# Patient Record
Sex: Female | Born: 1965 | Race: White | Hispanic: No | Marital: Married | Smoking: Former smoker
Health system: Southern US, Community
[De-identification: ages and names within clinical notes are randomized; demographics above are authoritative.]

## PROBLEM LIST (undated history)

## (undated) DIAGNOSIS — I959 Hypotension, unspecified: Secondary | ICD-10-CM

## (undated) DIAGNOSIS — C50511 Malignant neoplasm of lower-outer quadrant of right female breast: Secondary | ICD-10-CM

## (undated) DIAGNOSIS — M533 Sacrococcygeal disorders, not elsewhere classified: Secondary | ICD-10-CM

## (undated) DIAGNOSIS — H6980 Other specified disorders of Eustachian tube, unspecified ear: Secondary | ICD-10-CM

## (undated) DIAGNOSIS — J45909 Unspecified asthma, uncomplicated: Secondary | ICD-10-CM

## (undated) DIAGNOSIS — T7840XA Allergy, unspecified, initial encounter: Secondary | ICD-10-CM

## (undated) DIAGNOSIS — Z923 Personal history of irradiation: Secondary | ICD-10-CM

## (undated) DIAGNOSIS — J189 Pneumonia, unspecified organism: Secondary | ICD-10-CM

## (undated) DIAGNOSIS — R87619 Unspecified abnormal cytological findings in specimens from cervix uteri: Secondary | ICD-10-CM

## (undated) DIAGNOSIS — Z8701 Personal history of pneumonia (recurrent): Secondary | ICD-10-CM

## (undated) DIAGNOSIS — M1990 Unspecified osteoarthritis, unspecified site: Secondary | ICD-10-CM

## (undated) DIAGNOSIS — Z8619 Personal history of other infectious and parasitic diseases: Secondary | ICD-10-CM

## (undated) HISTORY — DX: Allergy, unspecified, initial encounter: T78.40XA

## (undated) HISTORY — PX: TONSILLECTOMY: SUR1361

## (undated) HISTORY — DX: Other disorders of phosphorus metabolism: E83.39

## (undated) HISTORY — DX: Malignant neoplasm of lower-outer quadrant of right female breast: C50.511

## (undated) HISTORY — DX: Sacrococcygeal disorders, not elsewhere classified: M53.3

## (undated) HISTORY — DX: Other specified disorders of Eustachian tube, unspecified ear: H69.80

## (undated) HISTORY — PX: POLYPECTOMY: SHX149

## (undated) HISTORY — DX: Unspecified asthma, uncomplicated: J45.909

## (undated) HISTORY — PX: WISDOM TOOTH EXTRACTION: SHX21

## (undated) HISTORY — DX: Personal history of other infectious and parasitic diseases: Z86.19

## (undated) HISTORY — DX: Personal history of pneumonia (recurrent): Z87.01

## (undated) HISTORY — DX: Unspecified abnormal cytological findings in specimens from cervix uteri: R87.619

---
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
## 1994-12-04 ENCOUNTER — Encounter: Payer: Self-pay | Admitting: Obstetrics & Gynecology

## 1996-04-05 ENCOUNTER — Encounter: Payer: Self-pay | Admitting: Obstetrics & Gynecology

## 1996-09-22 ENCOUNTER — Encounter: Payer: Self-pay | Admitting: Obstetrics & Gynecology

## 1996-11-22 ENCOUNTER — Encounter: Payer: Self-pay | Admitting: Obstetrics & Gynecology

## 1998-03-27 ENCOUNTER — Encounter: Payer: Self-pay | Admitting: Obstetrics & Gynecology

## 2004-04-23 ENCOUNTER — Encounter: Payer: Self-pay | Admitting: Internal Medicine

## 2004-05-09 ENCOUNTER — Encounter: Payer: Self-pay | Admitting: Internal Medicine

## 2004-06-02 ENCOUNTER — Ambulatory Visit: Payer: Self-pay | Admitting: Internal Medicine

## 2004-06-04 ENCOUNTER — Encounter: Payer: Self-pay | Admitting: Internal Medicine

## 2004-06-04 ENCOUNTER — Encounter: Admission: RE | Admit: 2004-06-04 | Discharge: 2004-06-04 | Payer: Self-pay | Admitting: Internal Medicine

## 2005-07-14 ENCOUNTER — Encounter: Payer: Self-pay | Admitting: Obstetrics & Gynecology

## 2005-12-15 ENCOUNTER — Encounter: Payer: Self-pay | Admitting: Obstetrics & Gynecology

## 2005-12-30 ENCOUNTER — Encounter: Payer: Self-pay | Admitting: Obstetrics & Gynecology

## 2006-01-12 ENCOUNTER — Encounter: Payer: Self-pay | Admitting: Obstetrics & Gynecology

## 2006-02-08 ENCOUNTER — Encounter: Payer: Self-pay | Admitting: Obstetrics & Gynecology

## 2006-04-08 ENCOUNTER — Inpatient Hospital Stay (HOSPITAL_COMMUNITY): Admission: AD | Admit: 2006-04-08 | Discharge: 2006-04-10 | Payer: Self-pay | Admitting: Obstetrics and Gynecology

## 2006-04-09 ENCOUNTER — Encounter: Payer: Self-pay | Admitting: Obstetrics & Gynecology

## 2006-04-27 HISTORY — PX: OTHER SURGICAL HISTORY: SHX169

## 2006-06-16 ENCOUNTER — Ambulatory Visit (HOSPITAL_COMMUNITY): Admission: RE | Admit: 2006-06-16 | Discharge: 2006-06-16 | Payer: Self-pay | Admitting: Obstetrics and Gynecology

## 2006-06-16 ENCOUNTER — Encounter (INDEPENDENT_AMBULATORY_CARE_PROVIDER_SITE_OTHER): Payer: Self-pay | Admitting: *Deleted

## 2007-04-23 ENCOUNTER — Ambulatory Visit: Payer: Self-pay | Admitting: Family Medicine

## 2007-10-13 ENCOUNTER — Ambulatory Visit: Payer: Self-pay | Admitting: Internal Medicine

## 2007-10-13 DIAGNOSIS — Z87891 Personal history of nicotine dependence: Secondary | ICD-10-CM | POA: Insufficient documentation

## 2007-10-13 DIAGNOSIS — F17200 Nicotine dependence, unspecified, uncomplicated: Secondary | ICD-10-CM | POA: Insufficient documentation

## 2007-10-13 DIAGNOSIS — E8941 Symptomatic postprocedural ovarian failure: Secondary | ICD-10-CM | POA: Insufficient documentation

## 2007-10-21 ENCOUNTER — Ambulatory Visit: Payer: Self-pay | Admitting: Internal Medicine

## 2007-10-21 ENCOUNTER — Encounter (INDEPENDENT_AMBULATORY_CARE_PROVIDER_SITE_OTHER): Payer: Self-pay | Admitting: *Deleted

## 2007-10-21 LAB — CONVERTED CEMR LAB
OCCULT 1: NEGATIVE
OCCULT 2: NEGATIVE

## 2007-10-25 ENCOUNTER — Encounter (INDEPENDENT_AMBULATORY_CARE_PROVIDER_SITE_OTHER): Payer: Self-pay | Admitting: *Deleted

## 2008-10-15 ENCOUNTER — Ambulatory Visit: Payer: Self-pay | Admitting: Internal Medicine

## 2008-10-15 DIAGNOSIS — F3289 Other specified depressive episodes: Secondary | ICD-10-CM | POA: Insufficient documentation

## 2008-10-15 DIAGNOSIS — K588 Other irritable bowel syndrome: Secondary | ICD-10-CM | POA: Insufficient documentation

## 2008-10-15 DIAGNOSIS — F329 Major depressive disorder, single episode, unspecified: Secondary | ICD-10-CM | POA: Insufficient documentation

## 2008-10-25 ENCOUNTER — Encounter (INDEPENDENT_AMBULATORY_CARE_PROVIDER_SITE_OTHER): Payer: Self-pay | Admitting: *Deleted

## 2009-04-29 ENCOUNTER — Ambulatory Visit: Payer: Self-pay | Admitting: Family Medicine

## 2009-06-21 ENCOUNTER — Telehealth: Payer: Self-pay | Admitting: Family Medicine

## 2010-01-07 ENCOUNTER — Ambulatory Visit: Payer: Self-pay | Admitting: Internal Medicine

## 2010-01-08 ENCOUNTER — Ambulatory Visit: Payer: Self-pay | Admitting: Internal Medicine

## 2010-01-09 LAB — CONVERTED CEMR LAB
Albumin: 4.4 g/dL (ref 3.5–5.2)
Alkaline Phosphatase: 29 units/L — ABNORMAL LOW (ref 39–117)
Basophils Absolute: 0 10*3/uL (ref 0.0–0.1)
Bilirubin, Direct: 0.2 mg/dL (ref 0.0–0.3)
Calcium: 9.4 mg/dL (ref 8.4–10.5)
Eosinophils Absolute: 0.2 10*3/uL (ref 0.0–0.7)
GFR calc non Af Amer: 90.14 mL/min (ref >60)
HCT: 39.9 % (ref 36.0–46.0)
HDL: 63 mg/dL (ref >39.00)
Lymphs Abs: 1.9 10*3/uL (ref 0.7–4.0)
Monocytes Absolute: 0.6 10*3/uL (ref 0.1–1.0)
Monocytes Relative: 7.5 % (ref 3.0–12.0)
Platelets: 319 10*3/uL (ref 150.0–400.0)
RDW: 13.3 % (ref 11.5–14.6)
Sodium: 138 meq/L (ref 135–145)
TSH: 3.09 microintl units/mL (ref 0.35–5.50)
Total CHOL/HDL Ratio: 3
VLDL: 11 mg/dL (ref 0.0–40.0)

## 2010-01-28 ENCOUNTER — Ambulatory Visit: Payer: Self-pay | Admitting: Internal Medicine

## 2010-01-28 LAB — CONVERTED CEMR LAB: OCCULT 1: NEGATIVE

## 2010-01-29 ENCOUNTER — Encounter (INDEPENDENT_AMBULATORY_CARE_PROVIDER_SITE_OTHER): Payer: Self-pay | Admitting: *Deleted

## 2010-02-07 ENCOUNTER — Ambulatory Visit: Payer: Self-pay | Admitting: Internal Medicine

## 2010-02-07 LAB — CONVERTED CEMR LAB: Rapid Strep: NEGATIVE

## 2010-04-08 ENCOUNTER — Telehealth (INDEPENDENT_AMBULATORY_CARE_PROVIDER_SITE_OTHER): Payer: Self-pay | Admitting: *Deleted

## 2010-04-22 ENCOUNTER — Ambulatory Visit: Payer: Self-pay | Admitting: Internal Medicine

## 2010-04-22 DIAGNOSIS — M5412 Radiculopathy, cervical region: Secondary | ICD-10-CM | POA: Insufficient documentation

## 2010-04-25 ENCOUNTER — Ambulatory Visit (HOSPITAL_COMMUNITY)
Admission: RE | Admit: 2010-04-25 | Discharge: 2010-04-25 | Payer: Self-pay | Source: Home / Self Care | Attending: Internal Medicine | Admitting: Internal Medicine

## 2010-05-01 ENCOUNTER — Telehealth: Payer: Self-pay | Admitting: Internal Medicine

## 2010-05-25 LAB — CONVERTED CEMR LAB
ALT: 12 units/L (ref 0–35)
ALT: 17 units/L (ref 0–35)
Albumin: 4.1 g/dL (ref 3.5–5.2)
BUN: 6 mg/dL (ref 6–23)
Basophils Absolute: 0.1 10*3/uL (ref 0.0–0.1)
Basophils Relative: 0.1 % (ref 0.0–3.0)
Basophils Relative: 0.9 % (ref 0.0–1.0)
CO2: 27 meq/L (ref 19–32)
Calcium: 9.2 mg/dL (ref 8.4–10.5)
Chloride: 103 meq/L (ref 96–112)
Cholesterol: 153 mg/dL (ref 0–200)
Cholesterol: 174 mg/dL (ref 0–200)
Creatinine, Ser: 0.7 mg/dL (ref 0.4–1.2)
Eosinophils Absolute: 0.1 10*3/uL (ref 0.0–0.7)
Eosinophils Relative: 1 % (ref 0.0–5.0)
GFR calc Af Amer: 118 mL/min
HCT: 39 % (ref 36.0–46.0)
HCT: 39 % (ref 36.0–46.0)
Hemoglobin: 13.7 g/dL (ref 12.0–15.0)
LDL Cholesterol: 71 mg/dL (ref 0–99)
Lymphs Abs: 0.8 10*3/uL (ref 0.7–4.0)
MCHC: 35 g/dL (ref 30.0–36.0)
MCV: 91 fL (ref 78.0–100.0)
MCV: 91.5 fL (ref 78.0–100.0)
Monocytes Absolute: 0.6 10*3/uL (ref 0.1–1.0)
Monocytes Absolute: 0.8 10*3/uL (ref 0.1–1.0)
Neutro Abs: 5.7 10*3/uL (ref 1.4–7.7)
Potassium: 3.7 meq/L (ref 3.5–5.1)
RBC: 4.26 M/uL (ref 3.87–5.11)
RBC: 4.29 M/uL (ref 3.87–5.11)
RDW: 12 % (ref 11.5–14.6)
TSH: 1.24 microintl units/mL (ref 0.35–5.50)
TSH: 2.16 microintl units/mL (ref 0.35–5.50)
Total Bilirubin: 0.7 mg/dL (ref 0.3–1.2)
Total CHOL/HDL Ratio: 2
Total Protein: 7 g/dL (ref 6.0–8.3)
Triglycerides: 51 mg/dL (ref 0–149)
WBC: 6.2 10*3/uL (ref 4.5–10.5)

## 2010-05-29 ENCOUNTER — Telehealth: Payer: Self-pay | Admitting: Internal Medicine

## 2010-05-29 NOTE — Assessment & Plan Note (Signed)
Summary: followup on shoulder pain/kn   Vital Signs:  Patient profile:   45 year old female Weight:      153.4 pounds BMI:     23.93 Temp:     98.5 degrees F oral Pulse rate:   76 / minute Resp:     15 per minute BP sitting:   116 / 68  (left arm) Cuff size:   regular  Vitals Entered By: Shonna Chock CMA (April 22, 2010 10:37 AM) CC: Ongoing shoulder pain-worse, Shoulder pain   Primary Care Provider:  Alwyn Fuentes  CC:  Ongoing shoulder pain-worse and Shoulder pain.  History of Present Illness:     She was seen for same symptoms by Dr Rennis Chris  @  [facility]  in 2006 -2007 with EMG/NCT and MRI. 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  Appt cancelled with Dr Newell Coral, NS. The patient reports numbness, weakness, tingling in RUE , and chronically  impaired  neck ROM, but denies locking, stiffness, swelling, and redness of the shoulder itself.  The pain is located in the right upper back,  shoulder & RUE.  The pain began without injury; a trigger is  life stresses .  The patient describes the pain as sharp  to  burning.  The pain is worse  through the day. Rx: slight improvement with NSAIDS, Tramadol & muscle relaxants.  Current Medications (verified): 1)  Proair Hfa 108 (90 Base) Mcg/act Aers (Albuterol Sulfate) .... 2 Puffs Qid As Needed 2)  Fluticasone Propionate 50 Mcg/act Susp (Fluticasone Propionate) .Marland Kitchen.. 1 Spray Two Times A Day After Neti Pot 3)  Cyclobenzaprine Hcl 5 Mg Tabs (Cyclobenzaprine Hcl) .Marland Kitchen.. 1 By Mouth Two Times A Day and 1-2 At Bedtime As Needed Only 4)  Tramadol Hcl 50 Mg Tabs (Tramadol Hcl) .... As Needed For Shoulder Pain 5)  Excedrin Extra Strength 250-250-65 Mg Tabs (Aspirin-Acetaminophen-Caffeine) .Marland Kitchen.. 1 By Mouth As Needed For Shoulder Pain 6)  Aleve 220 Mg Tabs (Naproxen Sodium) .... As Needed For Shoulder Pain  Allergies: 1)  ! Codeine 2)  !  Novocain  Physical Exam  General:  in no acute distress; alert,appropriate and cooperative throughout examination Neck:  Decreased ROM especialy to L Extremities:  No clubbing, cyanosis, edema, or deformity noted with normal full range of motion of R shoulder but with some discomfort Neurologic:  alert & oriented X3, strength normal in all extremities, and DTRs symmetrical and normal.   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 2 puffs qid as needed 2)  Fluticasone Propionate 50 Mcg/act Susp (Fluticasone propionate) .Marland Kitchen.. 1 spray two times a day after neti pot 3)  Cyclobenzaprine Hcl 5 Mg Tabs (Cyclobenzaprine hcl) .Marland Kitchen.. 1 by mouth two times a day and 1-2 at bedtime as needed only 4)  Tramadol Hcl 50 Mg Tabs (Tramadol hcl) .... Every 6 hrs as needed for shoulder pain 5)  Excedrin Extra Strength 250-250-65 Mg Tabs (Aspirin-acetaminophen-caffeine) .Marland Kitchen.. 1 by mouth as needed for shoulder pain 6)  Aleve 220 Mg Tabs (Naproxen sodium) .... As needed for shoulder pain  Patient Instructions:  1)  Continue muscle relaxant as needed ; MRI will be scheduled  Prescriptions: TRAMADOL HCL 50 MG TABS (TRAMADOL HCL) every 6 hrs as needed for shoulder pain  #30 x 1   Entered and Authorized by:   Marga Melnick MD   Signed by:   Marga Melnick MD on 04/22/2010   Method used:   Print then Give to Patient   RxID:   [id number]    Orders Added: 1)  Est. Patient Level III [14782] 2)  Radiology Referral [Radiology]

## 2010-05-29 NOTE — Progress Notes (Signed)
Summary: NEEDS CHANTIX PRESCRIPTION  Phone Note Call from Patient Call back at [phone]   Caller: Patient Summary of Call: PATIENT SAYS SHE IS NOW READY TO TRY CHANTIX--PLEASE CALL IT INTO [facility] [address] [facility] [place] Initial call taken by: Jerolyn Shin,  June 21, 2009 12:03 PM  Follow-up for Phone Call        give sample of 2 weeks and rx for con't pack #1  as directed 3 refills Follow-up by: Loreen Freud DO,  June 21, 2009 1:43 PM  Additional Follow-up for Phone Call Additional follow up Details #1::        left message for pt- samples and rx up front for her to pick up. Army Fossa CMA  June 21, 2009 3:00 PM     Additional Follow-up for Phone Call Additional follow up Details #2::    Pts aware. Army Fossa CMA  June 21, 2009 3:43 PM   New/Updated Medications: CHANTIX CONTINUING MONTH PAK 1 MG TABS (VARENICLINE TARTRATE) as directed. CHANTIX STARTING MONTH PAK 0.5 MG X 11 & 1 MG X 42 TABS (VARENICLINE TARTRATE)  Prescriptions: CHANTIX CONTINUING MONTH PAK 1 MG TABS (VARENICLINE TARTRATE) as directed.  #1 x 3   Entered by:   Army Fossa CMA   Authorized by:   Loreen Freud DO   Signed by:   Army Fossa CMA on 06/21/2009   Method used:   Print then Give to Patient   RxID:   [id number]

## 2010-05-29 NOTE — Assessment & Plan Note (Signed)
Summary: CPX///SPH   Vital Signs:  Patient profile:   45 year old female Height:      67.25 inches Weight:      147.4 pounds BMI:     23.00 Temp:     98.4 degrees F oral Pulse rate:   64 / minute Resp:     14 per minute BP sitting:   104 / 62  (left arm) Cuff size:   regular  Vitals Entered By: Shonna Chock CMA (January 07, 2010 1:12 PM)    Primary Care Provider:  Alwyn Ren   History of Present Illness: Barbara Fuentes is here for a physical; she is essentially asymptomatic except for intermittent  neck pain responsive to massage therapy.  Current Medications (verified): 1)  Proair Hfa 108 (90 Base) Mcg/act Aers (Albuterol Sulfate) .... 2 Puffs Qid As Needed  Allergies: 1)  ! Codeine 2)  ! Novocain  Past History:  Past Medical History: Smoker; RAD with RTIs Codeine  intolerance( N&V)  Past Surgical History: PNA as child ; Gastroenteritis  X 4 from age 65 to mid  53s complicated  by dehydration ; G2 P2; [facility] biopsies X2 for abnormal PAP smears (PAP smears WNL since 1995); S/P uterine ablation for dysfunctional menses; Tonsillectomy  Family History: Father:DM  Type 2, alcohol excess , depression Mother: cardiac arrest with novacaine Siblings: bro :AIDS, depression PGF: MI in 44s; PGM :skin cancer ; MGF: colon cancer   Social History: No diet Occupation: Theatre manager Married Current Smoker: 1/2 ppd Alcohol use-yes: socially Regular exercise: walks 3-6 mpd once daily   Review of Systems General:  Denies chills, fever, sleep disorder, sweats, and weight loss. Eyes:  Denies blurring, double vision, and vision loss-both eyes. ENT:  Denies difficulty swallowing and hoarseness. CV:  Denies chest pain or discomfort, leg cramps with exertion, palpitations, shortness of breath with exertion, swelling of feet, and swelling of hands. Resp:  Denies cough, shortness of breath, sputum productive, and wheezing. GI:  Denies abdominal pain, bloody stools, constipation, dark  tarry stools, diarrhea, and indigestion. GU:  Denies decreased libido, discharge, and dysuria. MS:  Denies joint redness, joint swelling, low back pain, mid back pain, and thoracic pain. Derm:  Denies changes in nail beds, dryness, and hair loss. Neuro:  Denies brief paralysis, disturbances in coordination, numbness, poor balance, tingling, and weakness. Psych:  Denies anxiety and depression. Endo:  Denies cold intolerance, excessive hunger, excessive thirst, excessive urination, and heat intolerance. Heme:  Denies abnormal bruising and bleeding. Allergy:  Complains of itching eyes, seasonal allergies, and sneezing; Rx: Claritin or Benadryl as needed .  Physical Exam  General:  Thin,well-nourished; alert,appropriate and cooperative throughout examination Head:  Normocephalic and atraumatic without obvious abnormalities.  Eyes:  No corneal or conjunctival inflammation noted.  Perrla. Funduscopic exam benign, without hemorrhages, exudates or papilledema.  Ears:  External ear exam shows no significant lesions or deformities.  Otoscopic examination reveals clear canals, tympanic membranes are intact bilaterally without bulging, retraction, inflammation or discharge. Hearing is grossly normal bilaterally. Nose:  External nasal examination shows no deformity or inflammation. Nasal mucosa are pink and moist without lesions or exudates. Mouth:  Oral mucosa and oropharynx without lesions or exudates.  Teeth in good repair. Neck:  No deformities, masses, or tenderness noted.Full ROM Lungs:  Normal respiratory effort, chest expands symmetrically. Lungs are clear to auscultation, no crackles or wheezes. Heart:  Normal rate and regular rhythm. S1 and S2 normal without gallop, murmur, click, rub. S4 Abdomen:  Bowel sounds  positive,abdomen soft and non-tender without masses, organomegaly or hernias noted. Aorta palpable w/o AAA Genitalia:  Dr Emelda Brothers, Gyn Msk:  No deformity or scoliosis noted of thoracic or  lumbar spine.   Pulses:  R and L carotid,radial,dorsalis pedis and posterior tibial pulses are full and equal bilaterally Extremities:  No clubbing, cyanosis, edema, or deformity noted with normal full range of motion of all joints.   Neurologic:  alert & oriented X3, strength normal in all extremities, and DTRs symmetrical and normal.   Skin:  Intact without suspicious lesions or rashes Cervical Nodes:  No lymphadenopathy noted Axillary Nodes:  No palpable lymphadenopathy Psych:  memory intact for recent and remote, normally interactive, and good eye contact.     Impression & Recommendations:  Problem # 1:  ROUTINE GENERAL MEDICAL EXAM@HEALTH  CARE FACL (ICD-V70.0)  Orders: EKG w/ Interpretation (93000)  Problem # 2:  NONDEPENDENT TOBACCO USE DISORDER (ICD-305.1) Risk discussed The following medications were removed from the medication list:    Chantix Continuing Month Pak 1 Mg Tabs (Varenicline tartrate) .Marland Kitchen... As directed.    Chantix Starting Month Pak 0.5 Mg X 11 & 1 Mg X 42 Tabs (Varenicline tartrate)  Complete Medication List: 1)  Proair Hfa 108 (90 Base) Mcg/act Aers (Albuterol sulfate) .... 2 puffs qid as needed  Other Orders: Admin 1st Vaccine (04540) Flu Vaccine 53yrs + (98119)  Patient Instructions: 1)  Report persistant neck pain or  pain radiating into upper extremities. Complete stool cards.Call insurance company & see if they will cover a colonoscopy due to your MGF 's history of colon cancer.Consider fasting labs: 2)  BMP ; 3)  Hepatic Panel ; 4)  Lipid Panel ; 5)  TSH; 6)  CBC w/ Diff . 7)  Stop Smoking Tips: Choose a Quit date. Cut down before the Quit date. decide what you will do as a substitute when you feel the urge to smoke(gum,toothpick,exercise). Flu Vaccine Consent Questions     Do you have a history of severe allergic reactions to this vaccine? no    Any prior history of allergic reactions to egg and/or gelatin? no    Do you have a sensitivity to the  preservative Thimersol? no    Do you have a past history of Guillan-Barre Syndrome? no    Do you currently have an acute febrile illness? no    Have you ever had a severe reaction to latex? no    Vaccine information given and explained to patient? yes    Are you currently pregnant? no    Lot Number:[id number]   Exp Date:10/25/2010   Site Given Right Deltoid IMpid Panel ; 5)  TSH; 6)  CBC w/ Diff .   .lbflu

## 2010-05-29 NOTE — Letter (Signed)
Summary: [facility]  [facility]   Imported By: Lanelle Bal 04/29/2010 12:39:40  _____________________________________________________________________  External Attachment:    Type:   Image     Comment:   External Document

## 2010-05-29 NOTE — Assessment & Plan Note (Signed)
Summary: cold/kdc   Vital Signs:  Patient profile:   45 year old female Weight:      149 pounds Temp:     98.6 degrees F oral Pulse rate:   78 / minute Pulse rhythm:   regular BP sitting:   124 / 80  (left arm) Cuff size:   regular  Vitals Entered By: Army Fossa CMA (April 29, 2009 3:42 PM) CC: Pt c/o chest and head/nasal congestion x 8 days. , URI symptoms   History of Present Illness:       This is a 45 year old woman who presents with URI symptoms.  The symptoms began 2 weeks ago.  The patient complains of nasal congestion, purulent nasal discharge, sore throat, and productive cough, but denies clear nasal discharge, dry cough, earache, and sick contacts.  Associated symptoms include low-grade fever (<100.5 degrees).  The patient denies fever, fever of 100.5-103 degrees, fever of 103.1-104 degrees, fever to >104 degrees, stiff neck, dyspnea, wheezing, rash, vomiting, diarrhea, use of an antipyretic, and response to antipyretic.  The patient also reports headache.  The patient denies itchy watery eyes, itchy throat, sneezing, seasonal symptoms, response to antihistamine, muscle aches, and severe fatigue.  Risk factors for Strep sinusitis include tooth pain.  The patient denies the following risk factors for Strep sinusitis: unilateral facial pain, unilateral nasal discharge, poor response to decongestant, double sickening, Strep exposure, tender adenopathy, and absence of cough.    Current Medications (verified): 1)  Augmentin 875-125 Mg Tabs (Amoxicillin-Pot Clavulanate) .Marland Kitchen.. 1 By Mouth Two Times A Day 2)  Proair Hfa 108 (90 Base) Mcg/act Aers (Albuterol Sulfate) .... 2 Puffs Qid As Needed 3)  Symbicort 160-4.5 Mcg/act Aero (Budesonide-Formoterol Fumarate) .... 2 Puffs Two Times A Day  Allergies: 1)  ! Codeine 2)  ! Novocain  Past History:  Past medical, surgical, family and social histories (including risk factors) reviewed for relevance to current acute and chronic  problems.  Past Medical History: Reviewed history from 10/15/2008 and no changes required. Smoker Codeine caused N&V  Past Surgical History: Reviewed history from 10/15/2008 and no changes required. PNA as child ; gastroenteritis age 74 to mid  33s X 4 with dehydration ; G2 P2; [facility] biopsies X2 for abnormal PAP smears(PAP smears WNL > 15 years); S/P uterine ablation for dyafunctional menses; Tonsillectomy  Family History: Reviewed history from 10/15/2008 and no changes required. Father:DM 2, alcohol excess , depression Mother: neg Siblings: bro AIDS, depression PGF MI in 37s; PGM skin CA; MGF colon CA  Social History: Reviewed history from 10/15/2008 and no changes required. No diet Occupation: Theatre manager Married Current Smoker: 1 ppd Alcohol use-yes: socially Regular exercise-no  Review of Systems      See HPI General:  Denies chills, fatigue, fever, loss of appetite, malaise, sleep disorder, sweats, weakness, and weight loss.  Physical Exam  General:  Well-developed,well-nourished,in no acute distress; alert,appropriate and cooperative throughout examination Ears:  External ear exam shows no significant lesions or deformities.  Otoscopic examination reveals clear canals, tympanic membranes are intact bilaterally without bulging, retraction, inflammation or discharge. Hearing is grossly normal bilaterally. Nose:  mucosal erythema, mucosal edema, L frontal sinus tenderness, L maxillary sinus tenderness, R frontal sinus tenderness, and R maxillary sinus tenderness.   Mouth:  pharyngeal erythema.   Neck:  cervical lymphadenopathy.   Lungs:  R wheezes and L wheezes.   Heart:  normal rate and no murmur.   Psych:  Oriented X3 and normally interactive.  Impression & Recommendations:  Problem # 1:  SINUSITIS - ACUTE-NOS (ICD-461.9)  Her updated medication list for this problem includes:    Augmentin 875-125 Mg Tabs (Amoxicillin-pot clavulanate) .Marland Kitchen... 1 by mouth two  times a day  Instructed on treatment. Call if symptoms persist or worsen.   Problem # 2:  BRONCHITIS- ACUTE (ICD-466.0)  Her updated medication list for this problem includes:    Augmentin 875-125 Mg Tabs (Amoxicillin-pot clavulanate) .Marland Kitchen... 1 by mouth two times a day    Proair Hfa 108 (90 Base) Mcg/act Aers (Albuterol sulfate) .Marland Kitchen... 2 puffs qid as needed    Symbicort 160-4.5 Mcg/act Aero (Budesonide-formoterol fumarate) .Marland Kitchen... 2 puffs two times a day    mucinex DM for cough  Complete Medication List: 1)  Augmentin 875-125 Mg Tabs (Amoxicillin-pot clavulanate) .Marland Kitchen.. 1 by mouth two times a day 2)  Proair Hfa 108 (90 Base) Mcg/act Aers (Albuterol sulfate) .... 2 puffs qid as needed 3)  Symbicort 160-4.5 Mcg/act Aero (Budesonide-formoterol fumarate) .... 2 puffs two times a day Prescriptions: SYMBICORT 160-4.5 MCG/ACT AERO (BUDESONIDE-FORMOTEROL FUMARATE) 2 puffs two times a day  #1 x 0   Entered and Authorized by:   Loreen Freud DO   Signed by:   Loreen Freud DO on 04/29/2009   Method used:   Historical   RxID:   [id number] PROAIR HFA 108 (90 BASE) MCG/ACT AERS (ALBUTEROL SULFATE) 2 puffs qid as needed  #1 x 0   Entered and Authorized by:   Loreen Freud DO   Signed by:   Loreen Freud DO on 04/29/2009   Method used:   Electronically to        [facility] [address] (retail)       [address]       [place], [place]  [address]       Ph: [phone]       Fax: [phone]   RxID:   [id number] AUGMENTIN 875-125 MG TABS (AMOXICILLIN-POT CLAVULANATE) 1 by mouth two times a day  #20 x 0   Entered and Authorized by:   Loreen Freud DO   Signed by:   Loreen Freud DO on 04/29/2009   Method used:   Electronically to        [facility] [address] (retail)       [address]       [place], [place]  [address]       Ph: [phone]       Fax: [phone]   RxID:   [id number]

## 2010-05-29 NOTE — Letter (Signed)
Summary: Results Follow up Letter  [facility] [place]  [address] [place], [place] [address]   Phone: [phone]  Fax: [phone]    01/29/2010 MRN: [id number]  [facility] [address] [place], [place]  [address]  Dear Ms. Aragones,  The following are the results of your recent test(s):  Test         Result    Pap Smear:        Normal _____  Not Normal _____ Comments: ______________________________________________________ Cholesterol: LDL(Bad cholesterol):         Your goal is less than:         HDL (Good cholesterol):       Your goal is more than: Comments:  ______________________________________________________ Mammogram:        Normal _____  Not Normal _____ Comments:  ___________________________________________________________________ Hemoccult:        Normal __X___  Not normal _______ Comments:    _____________________________________________________________________ Other Tests:    We routinely do not discuss normal results over the telephone.  If you desire a copy of the results, or you have any questions about this information we can discuss them at your next office visit.   Sincerely,

## 2010-05-29 NOTE — Assessment & Plan Note (Signed)
Summary: FOR STREP THROAT//PH   Vital Signs:  Patient profile:   45 year old female Weight:      150 pounds BMI:     23.40 Temp:     98.4 degrees F oral Pulse rate:   60 / minute Resp:     12 per minute BP sitting:   118 / 78  (left arm) Cuff size:   regular  Vitals Entered By: Shonna Chock CMA (February 07, 2010 9:17 AM) CC: ? Strep, URI symptoms   Primary Care Provider:  Alwyn Ren  CC:  ? Strep and URI symptoms.  History of Present Illness: URI Symptoms      This is a 45 year old woman who presents with URI symptoms X 3 days.  The patient reports nasal congestion, sore throat, and productive cough with yellow sputum, but denies purulent nasal discharge and earache.  Associated symptoms include low-grade fever (<100.5 degrees) and dyspnea when supine .  The patient denies wheezing.  The patient also reports headache.  bilateral facial pain and tender adenopathy.  The patient denies the following risk factors for Strep sinusitis: tooth pain.Rx: OTC meds. Smoking 1/2 ppd.  Allergies: 1)  ! Codeine 2)  ! Novocain  Physical Exam  General:  well-nourished,in no acute distress; alert,appropriate and cooperative throughout examination Eyes:  No corneal or conjunctival inflammation noted.  Nose:  External nasal examination shows no deformity or inflammation. Nasal mucosa are dry without lesions or exudates. R nare closed due to deviation. Hyponasal speech Mouth:  Oral mucosa and oropharynx without lesions or exudates.  Teeth in good repair. Lungs:  Normal respiratory effort, chest expands symmetrically. Lungs : minimal mild rhonchi Cervical Nodes:  No lymphadenopathy noted Axillary Nodes:  No palpable lymphadenopathy   Impression & Recommendations:  Problem # 1:  BRONCHITIS-ACUTE (ICD-466.0)  Her updated medication list for this problem includes:    Proair Hfa 108 (90 Base) Mcg/act Aers (Albuterol sulfate) .Marland Kitchen... 2 puffs qid as needed    Amoxicillin-pot Clavulanate 500-125 Mg Tabs  (Amoxicillin-pot clavulanate) .Marland Kitchen... 1 every 12 hrs with a meal    Benzonatate 200 Mg Caps (Benzonatate) .Marland Kitchen... 1 every 6 hrs as needed cough  Problem # 2:  URI (ICD-465.9)  Her updated medication list for this problem includes:    Benzonatate 200 Mg Caps (Benzonatate) .Marland Kitchen... 1 every 6 hrs as needed cough  Complete Medication List: 1)  Proair Hfa 108 (90 Base) Mcg/act Aers (Albuterol sulfate) .... 2 puffs qid as needed 2)  Fluticasone Propionate 50 Mcg/act Susp (Fluticasone propionate) .Marland Kitchen.. 1 spray two times a day after neti pot 3)  Amoxicillin-pot Clavulanate 500-125 Mg Tabs (Amoxicillin-pot clavulanate) .Marland Kitchen.. 1 every 12 hrs with a meal 4)  Benzonatate 200 Mg Caps (Benzonatate) .Marland Kitchen.. 1 every 6 hrs as needed cough  Other Orders: Rapid Strep (51025)  Patient Instructions: 1)  Drink as much NON dairy  fluid as you can tolerate for the next few days. Neti pot two times a day followed by Nasal spray. 2)  Stop Smoking Tips: Choose a Quit date. Cut down before the Quit date. decide what you will do as a substitute when you feel the urge to smoke(gum,toothpick,exercise). Prescriptions: BENZONATATE 200 MG CAPS (BENZONATATE) 1 every 6 hrs as needed cough  #20 x 0   Entered and Authorized by:   Marga Melnick MD   Signed by:   Marga Melnick MD on 02/07/2010   Method used:   Printed then faxed to .Marland KitchenMarland Kitchen  [facility] (retail)       [address]       [place], [place]  [address]       Ph: [phone]       Fax: [phone]   RxID:   [id number] AMOXICILLIN-POT CLAVULANATE 500-125 MG TABS (AMOXICILLIN-POT CLAVULANATE) 1 every 12 hrs WITH a meal  #20 x 0   Entered and Authorized by:   Marga Melnick MD   Signed by:   Marga Melnick MD on 02/07/2010   Method used:   Printed then faxed to ...       [facility] (retail)       [address]       [place], [place]  [address]       Ph: [phone]       Fax: [phone]   RxID:   [id number] FLUTICASONE  PROPIONATE 50 MCG/ACT SUSP (FLUTICASONE PROPIONATE) 1 spray two times a day after Neti pot  #1 x 5   Entered and Authorized by:   Marga Melnick MD   Signed by:   Marga Melnick MD on 02/07/2010   Method used:   Printed then faxed to ...       [facility] (retail)       [address]       [place], [place]  [address]       Ph: [phone]       Fax: [phone]   RxID:   [id number]   Laboratory Results    Other Tests  Rapid Strep: negative

## 2010-05-29 NOTE — Letter (Signed)
Summary: [facility]  [facility]   Imported By: Lanelle Bal 04/29/2010 12:41:06  _____________________________________________________________________  External Attachment:    Type:   Image     Comment:   External Document

## 2010-05-29 NOTE — Progress Notes (Signed)
Summary: Neurosurgical referral  Phone Note Call from Patient   Summary of Call: Patient called noting that she did receive her results in the mail and would like neurosurgical referral. MD picked by [facility] will be fine.   Please advise. Initial call taken by: Lucious Groves CMA,  May 01, 2010 8:17 AM  Follow-up for Phone Call        Dr Venetia Maxon if on plan Follow-up by: Marga Melnick MD,  May 01, 2010 2:10 PM  Additional Follow-up for Phone Call Additional follow up Details #1::        [facility]ERRED TO [facility], PT PLAN IS NOW BCBS, AWAITING APPT & WILL INFORM PT Magdalen Spatz [facility]  May 08, 2010 10:46 AM

## 2010-05-29 NOTE — Progress Notes (Signed)
Summary: rx request  Phone Note Call from Patient Call back at Home Phone [phone]   Summary of Call: Patient left message on triage that she needs prescription for muscle relaxant that was discussed with MD. She has not needed it until now, but is having severe back and shoulder pain. Please advise. Initial call taken by: Lucious Groves CMA,  April 08, 2010 4:30 PM  Follow-up for Phone Call        Per Dr.Hopper Flexeril 5mg  1 by mouth two times a day #20 Patient aware and ok'd Follow-up by: Shonna Chock CMA,  April 08, 2010 4:52 PM    New/Updated Medications: CYCLOBENZAPRINE HCL 5 MG TABS (CYCLOBENZAPRINE HCL) 1 by mouth two times a day and 1-2 at bedtime as needed ONLY Prescriptions: CYCLOBENZAPRINE HCL 5 MG TABS (CYCLOBENZAPRINE HCL) 1 by mouth two times a day and 1-2 at bedtime as needed ONLY  #20 x 0   Entered by:   Shonna Chock CMA   Authorized by:   Marga Melnick MD   Signed by:   Shonna Chock CMA on 04/08/2010   Method used:   Electronically to        [facility] (retail)       [address]       [place], [place]  [address]       Ph: [phone]       Fax: [phone]   RxID:   [id number]

## 2010-06-12 NOTE — Progress Notes (Signed)
Summary: Referral Sooner  Phone Note Call from Patient Call back at Home Phone [phone]   Caller: Patient Summary of Call: Patient called this morning saying that [facility] has pushed her appt back another 3 weeks. She was told it was due to a surgical scheduling change. Patient is c/o of severe tenderness in the right axilla. Patient says that her right arm feels like she has a blood pressure cuff consistantly on and her right hand is significatly colder then her left hand. She says when she is at work and clicking her mouse she gets a shooting pain up her arm. Patient says the pain is getting worse. She would really like to see another neurosurgeon if possible so she can be seen sooner. Please advise.  Initial call taken by: Harold Barban,  May 29, 2010 8:56 AM  Follow-up for Phone Call        see if insurance covers one of med centers; ask NS group here to put her on cancellation list Follow-up by: Marga Melnick MD,  May 29, 2010 1:52 PM  Additional Follow-up for Phone Call Additional follow up Details #1::        Per [facility], the patient is on their waiting/cancellation list.  Also they are to check with Dr. Fredrich Birks nurse to see about working pt in for a sooner appt.  I explained to patient, she understands. Magdalen Spatz [facility]  June 06, 2010 3:52 PM

## 2010-06-18 ENCOUNTER — Encounter: Payer: Self-pay | Admitting: Internal Medicine

## 2010-07-02 ENCOUNTER — Other Ambulatory Visit (HOSPITAL_COMMUNITY): Payer: BC Managed Care – PPO

## 2010-07-24 NOTE — Letter (Signed)
Summary: [facility]   Imported By: Kassie Mends 07/14/2010 11:02:08  _____________________________________________________________________  External Attachment:    Type:   Image     Comment:   External Document

## 2010-07-28 ENCOUNTER — Telehealth: Payer: Self-pay | Admitting: *Deleted

## 2010-07-28 ENCOUNTER — Encounter (HOSPITAL_COMMUNITY)
Admission: RE | Admit: 2010-07-28 | Discharge: 2010-07-28 | Disposition: A | Discharge status: Home / Self Care | Payer: BC Managed Care – PPO | Source: Ambulatory Visit | Attending: Neurosurgery | Admitting: Neurosurgery

## 2010-07-28 LAB — CBC
HCT: 37.6 % (ref 36.0–46.0)
MCH: 30.2 pg (ref 26.0–34.0)
MCHC: 34 g/dL (ref 30.0–36.0)
MCV: 88.7 fL (ref 78.0–100.0)
RDW: 12.6 % (ref 11.5–15.5)

## 2010-07-28 MED ORDER — MELOXICAM 7.5 MG PO TABS: 7.5000 mg | ORAL_TABLET | Freq: Every day | ORAL | Status: DC

## 2010-07-28 NOTE — Telephone Encounter (Signed)
Chart review this states allergic to codeine. Maalox and 7.5 mg twice a day when necessary dispense 14

## 2010-07-28 NOTE — Telephone Encounter (Signed)
Spoke w/ pt aware rx for meloxicam to be called to pharmacy.

## 2010-07-30 HISTORY — PX: CERVICAL FUSION: SHX112

## 2010-07-31 ENCOUNTER — Observation Stay (HOSPITAL_COMMUNITY)
Admission: RE | Admit: 2010-07-31 | Discharge: 2010-08-01 | Disposition: A | Discharge status: Home / Self Care | Payer: BC Managed Care – PPO | Source: Ambulatory Visit | Attending: Neurosurgery | Admitting: Neurosurgery

## 2010-07-31 ENCOUNTER — Inpatient Hospital Stay (HOSPITAL_COMMUNITY): Payer: BC Managed Care – PPO

## 2010-07-31 DIAGNOSIS — Z01812 Encounter for preprocedural laboratory examination: Secondary | ICD-10-CM | POA: Insufficient documentation

## 2010-07-31 DIAGNOSIS — M5000 Cervical disc disorder with myelopathy, unspecified cervical region: Principal | ICD-10-CM | POA: Insufficient documentation

## 2010-07-31 DIAGNOSIS — Z87891 Personal history of nicotine dependence: Secondary | ICD-10-CM | POA: Insufficient documentation

## 2010-08-13 NOTE — Op Note (Signed)
[name], [name]                ACCOUNT NO.:  [id number]  MEDICAL RECORD NO.:  [id number]           PATIENT TYPE:  O  LOCATION:  3535                         FACILITY:  [facility]  PHYSICIAN:  Danae Orleans. Venetia Maxon, M.D.  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  PROCEDURE:  Anterior cervical decompression and fusion; C4-5, C5-6, C6-7 levels with PEEK interbody cages, morselized bone autograft, allograft with PureGen, and anterior cervical plate.  SURGEON:  Danae Orleans. Venetia Maxon, MD  ASSISTANT:  Georgiann Cocker, RN and Coletta Memos, MD  ANESTHESIA:  General endotracheal anesthesia.  BLOOD LOSS:  50 mL  COMPLICATIONS:  None.  DISPOSITION:  Recovery.  INDICATIONS:  Barbara Fuentes is a 45 year old woman with profound right arm pain and weakness involving her deltoid biceps and triceps.  It was elected to take her to surgery for anterior cervical decompression and fusion at these affected levels.  PROCEDURE:  Barbara Fuentes was brought to the operating room.  Following satisfactory and uncomplicated induction of general endotracheal anesthesia and placement of intravenous lines, the patient was placed in supine position on the operating table.  Her neck was placed in slight extension.  She was placed in 5 pounds Halter traction.  Her anterior neck was then prepped and draped in usual sterile fashion.  Area of planned incision was infiltrated with local lidocaine.  Incision was made in the transverse neck creases from midline to the anterior border of  sternocleidomastoid muscle and carried through platysmal layer. Subplatysmal dissection was performed exposing the anterior border of the sternocleidomastoid muscle.  Using blunt dissection, the carotid sheath was kept lateral and the trachea and esophagus was kept medial exposing the anterior cervical spine.  A bent spinal needle was placed at what was felt to be C5-C6 level and this was confirmed on intraoperative x-ray.  Subsequently, the exposure was carried from C4 to C7 by taking down the longus colli muscles bilaterally with electrocautery and Key elevator.  Self-retaining shadow line retractor was placed to facilitate exposure.  Interspaces were incised and disk material was removed in piecemeal fashion and ventral osteophytes removed with osteophyte tool.  Distraction pins were placed at C4, C5, C6 and C7 levels and at each level, thorough diskectomy was performed under microscope.  The endplates were eburnated and uncinate spurs were drilled down and the spinal cord dura and both C5 nerve roots were widely decompressed.  Subsequently, similar decompression was performed at C5-6 and the right C6 nerve was widely decompressed.  There was some disk material that was herniated directly over the right C6 nerve root. Attention was then turned to the C6-7 level where thorough decompression was also performed and wide decompression of the nerve root was performed.  Hemostasis was assured at each level and after trial sizing,  it was elected to use a 5-mm PEEK interbody cage which was packed with PureGen, soaked on ProFuse block at the C4-5 level.  A similarly sized graft at the C5-6 level and a 6-mm medium PEEK interbody cage was packed in a similar fashion at C6-7 level.  The ventral spine was prepared for plating and after traction weight was discontinued, a 48-mm Trestle anterior cervical plate was affixed to the anterior cervical spine using variable angle 40-mm screws, 2 at C4, 2 at  C5, 2 at C6, and 2 at C7. All screws had excellent purchase and locking mechanisms were engaged. Final x-ray demonstrated well-positioned interbody graft anterior cervical plate.  Soft tissues were inspected and found to be in good repair.  Hemostasis was assured.  Wound was irrigated and the platysma layers were approximated with 3-0 Vicryl sutures.  Skin edges were approximated with 3-0 Vicryl subcuticular stitch.  Wound was dressed with Dermabond.  The patient was extubated in the operating, taken to the recovery in stable and satisfactory condition having tolerated operation well.  Counts were correct at the end of the case.     Danae Orleans. Venetia Maxon, M.D.     JDS/MEDQ  D:  07/31/2010  T:  08/01/2010  Job:  [id number]  Electronically Signed by Maeola Harman M.D. on 08/13/2010 09:54:04 AM

## 2010-08-19 NOTE — Discharge Summary (Signed)
  NAMEVADIS, SLABACH                ACCOUNT NO.:  [id number]  MEDICAL RECORD NO.:  [id number]           PATIENT TYPE:  O  LOCATION:  3535                         FACILITY:  [facility]  PHYSICIAN:  Danae Orleans. Venetia Maxon, M.D.  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  Preoperatively, the patient had profound right arm pain and weakness involving her deltoid, biceps, and triceps and it was elected for her to be admitted for anterior cervical decompression and fusion at those affected levels.  The patient tolerated this procedure well, postoperatively had good relief of her pain, was mobilized and was discharged home with tramadol 50 mg as needed for pain along with multivitamin, calcium, and over-the-counter allergy pills.  She is instructed to wear the cervical collar, do not smoke, and to follow up in my office 3-4 weeks postoperatively with cervical radiographs having tolerated procedure and hospitalization well.     Danae Orleans. Venetia Maxon, M.D.     JDS/MEDQ  D:  08/13/2010  T:  08/14/2010  Job:  [id number]  Electronically Signed by Maeola Harman M.D. on 08/19/2010 07:37:21 AM

## 2010-09-12 NOTE — Op Note (Signed)
Barbara Fuentes, METZINGER                ACCOUNT NO.:  [id number]   MEDICAL RECORD NO.:  [id number]          PATIENT TYPE:  AMB   LOCATION:  SDC                           [facility]ILITY:  [facility]   PHYSICIAN:  Lenoard Aden, M.D.DATE OF BIRTH:  1965/05/22   DATE OF PROCEDURE:  06/16/2006  DATE OF DISCHARGE:                               OPERATIVE REPORT   PREOPERATIVE DIAGNOSIS:  Menorrhagia.   POSTOPERATIVE DIAGNOSIS:  Menorrhagia.   PROCEDURE:  Diagnostic hysteroscopy, D&C, NovaSure endometrial ablation.   SURGEON:  Lenoard Aden, M.D.   ANESTHESIA:  General anesthesia.   ESTIMATED BLOOD LOSS:  Less than 50 mL.   FLUID DEFICIT:  Less than 50 mL.   COMPLICATIONS:  None.   PATHOLOGY:  Endometrial curettings.   Patient to recovery in good condition.   DESCRIPTION OF PROCEDURE:  After being apprised of risks of anesthesia,  infection, bleeding, injury to abdominal organs with need for repair,  delayed versus immediate complications to include bowel and bladder  injury, the patient is brought to the operating room where she is  administered general anesthetic without complications, prepped and  draped in usual fashion, catheterized until the bladder was empty.  On  examination under anesthesia, there was a small anteflexed uterus and no  adnexal masses.  After achieving adequate anesthesia, using the dilute  Nesacaine solution of 20 mL, cervix is easily dilated up to a #21 Pratt  dilator.  Hysteroscope placed.  Cervical length measured at 4 cm,  uterine sounds to 9 cm.  Diagnostic hysteroscopy performed and reveals a  normal endometrial cavity, bilateral normal tubal ostia.  D&C is  performed with sharp curettage in a four quadrant method, endometrial  curettings are collected and sent to pathology.  NovaSure device is  placed.  CO2 test is negative.  Achieving a good seal and seated in the  proper fashion.  At this time, the NovaSure is initiated and the  ablation takes a total  time of 1 minute and 22 seconds.  At this time,  procedure is terminated, device is removed.  Repeat hysteroscopy reveals  an intact intrauterine cavity, no evidence of perforation with good  coverage of the endometrial ablation for the entire  cavity.  Good hemostasis is noted.  There is small bleeding from the  area of paracervical block placement which is sutured using a 2-0  chromic suture with a single mattress type suture done.  All instruments  are removed.  Good hemostasis is noted.  Patient tolerated the procedure  well and is transferred to the recovery room in good condition.      Lenoard Aden, M.D.  Electronically Signed     RJT/MEDQ  D:  06/16/2006  T:  06/16/2006  Job:  [id number]

## 2010-09-12 NOTE — H&P (Signed)
Barbara Fuentes, Barbara Fuentes                ACCOUNT NO.:  [id number]   MEDICAL RECORD NO.:  [id number]          PATIENT TYPE:  INP   LOCATION:  9175                          FACILITY:  [facility]   PHYSICIAN:  Lenoard Aden, M.D.DATE OF BIRTH:  1965-09-16   DATE OF ADMISSION:  04/08/2006  DATE OF DISCHARGE:                              HISTORY & PHYSICAL   CHIEF COMPLAINT:  Labor.   HISTORY OF PRESENT ILLNESS:  She is a 45 year old white female, G 3, P  1, at 76 weeks' gestation with increased frequency of contractions  today.  She has a history of spontaneous vaginal delivery x1.  She is a  nonsmoker, nondrinker.  She denies domestic or physical violence.   MEDICATIONS:  Prenatal vitamins.   She has a history of pre-term contractions.  No evident cervical change.   ALLERGIES:  CODEINE, EPINEPHRINE.   She has a noncontributory social and family history.   PHYSICAL EXAMINATION:  GENERAL:  She is a well-developed, well-nourished  white female in a mild amount of distress.  HEENT:  Normal.  LUNGS:  Clear.  HEART:  Regular rate and rhythm.  ABDOMEN:  Soft, gravid, nontender.  Estimated fetal weight 7 pounds.  PELVIC:  Cervix is 8 cm, 100%, vertex, 0 station.  EXTREMITIES:  No cords.  NEUROLOGIC:  Nonfocal.   IMPRESSION:  1. Thirty-nine week OB.  2. Advanced maternal age.  3. Active labor.   PLAN:  Give epidural as needed.  Anticipate attempts at vaginal  delivery.      Lenoard Aden, M.D.  Electronically Signed     RJT/MEDQ  D:  04/08/2006  T:  04/08/2006  Job:  [id number]

## 2010-10-30 ENCOUNTER — Encounter: Payer: Self-pay | Admitting: Internal Medicine

## 2010-10-31 ENCOUNTER — Ambulatory Visit (INDEPENDENT_AMBULATORY_CARE_PROVIDER_SITE_OTHER): Payer: BC Managed Care – PPO | Admitting: Internal Medicine

## 2010-10-31 ENCOUNTER — Encounter: Payer: Self-pay | Admitting: Internal Medicine

## 2010-10-31 DIAGNOSIS — K589 Irritable bowel syndrome without diarrhea: Secondary | ICD-10-CM

## 2010-10-31 DIAGNOSIS — Z0000 Encounter for general adult medical examination without abnormal findings: Secondary | ICD-10-CM

## 2010-10-31 DIAGNOSIS — R635 Abnormal weight gain: Secondary | ICD-10-CM

## 2010-10-31 DIAGNOSIS — M5412 Radiculopathy, cervical region: Secondary | ICD-10-CM

## 2010-10-31 DIAGNOSIS — F329 Major depressive disorder, single episode, unspecified: Secondary | ICD-10-CM

## 2010-10-31 DIAGNOSIS — F3289 Other specified depressive episodes: Secondary | ICD-10-CM

## 2010-10-31 DIAGNOSIS — E8941 Symptomatic postprocedural ovarian failure: Secondary | ICD-10-CM

## 2010-10-31 NOTE — Patient Instructions (Addendum)
Preventive Health Care: Exercise  30-45  minutes a day, 3-4 days a week. Walking is especially valuable in preventing Osteoporosis. Eat a low-fat diet with lots of fruits and vegetables, up to 7-9 servings per day.Consume less than 30 grams of sugar per day from foods & drinks with High Fructose Corn Syrup as # 1,2,3 or #4 on label.

## 2010-10-31 NOTE — Progress Notes (Signed)
Subjective:    Patient ID: Barbara Fuentes, female    DOB: 1965/08/04, 45 y.o.   MRN: [id number]  HPI  Barbara Fuentes  is here for a Health assessment form completion; she has no acute issues.    Review of Systems Patient reports no vision/ hearing  changes, adenopathy,fever,   persistant / recurrent hoarseness , swallowing issues, chest pain,palpitations,edema,persistant /recurrent cough, hemoptysis, dyspnea( rest/ exertional/paroxysmal nocturnal), gastrointestinal bleeding(melena, rectal bleeding), abdominal pain, significant heartburn,  bowel changes,GU symptoms(dysuria, hematuria,pyuria, incontinence ), Gyn symptoms(abnormal  bleeding , pain),  syncope, focal weakness, numbness & tingling, skin/hair /nail changes,abnormal bruising or bleeding, anxiety,or depression.   She's had approximately 5 pound weight gain despite walking a 5K almost daily; she also engage in spinning classes it is too hot to walk.  She has no other active health issues or symptoms.    Objective:   Physical Exam Gen.: Healthy and well-nourished in appearance. Alert, appropriate and cooperative throughout exam. Head: Normocephalic without obvious abnormalities Eyes: No corneal or conjunctival inflammation noted. Pupils equal round reactive to light and accommodation. Fundal exam is benign without hemorrhages, exudate, papilledema. Extraocular motion intact. Vision grossly normal. Ears: External  ear exam reveals no significant lesions or deformities. Canals clear .TMs normal. Hearing is grossly normal bilaterally. Nose: External nasal exam reveals no deformity or inflammation. Nasal mucosa are pink and moist. No lesions or exudates noted. Mouth: Oral mucosa and oropharynx reveal no lesions or exudates. Teeth in good repair. Neck: No deformities, masses, or tenderness noted. Range of motion slightly decreased (from NS). Thyroid normal. Lungs: Normal respiratory effort; chest expands symmetrically. Lungs are clear to  auscultation without rales, wheezes, or increased work of breathing. Heart: Normal rate and rhythm. Normal S1 and S2. No gallop, click, or rub. No murmur. Abdomen: Bowel sounds normal; abdomen soft and nontender. No masses, organomegaly or hernias noted.                                                                                    Musculoskeletal/extremities: No deformity or scoliosis noted of  the thoracic or lumbar spine. No clubbing, cyanosis, edema, or deformity noted. Range of motion  normal .Tone & strength  normal.Joints normal. Nail health  good. Vascular: Carotid, radial artery, dorsalis pedis and  posterior tibial pulses are full and equal. No bruits present. Neurologic: Alert and oriented x3. Deep tendon reflexes symmetrical and normal.          Skin: Intact without suspicious lesions or rashes. Lymph: No cervical, axillary  lymphadenopathy present. Psych: Mood and affect are normal. Normally interactive                                                                                         Assessment & Plan:  #1 comprehensive physical exam; no acute findings #2 see Problem  List with Assessments & Recommendations Plan: see Orders

## 2010-11-26 ENCOUNTER — Other Ambulatory Visit (HOSPITAL_COMMUNITY): Payer: Self-pay | Admitting: Neurosurgery

## 2010-11-26 DIAGNOSIS — M4712 Other spondylosis with myelopathy, cervical region: Secondary | ICD-10-CM

## 2011-01-27 ENCOUNTER — Ambulatory Visit (HOSPITAL_COMMUNITY)
Admission: RE | Admit: 2011-01-27 | Discharge: 2011-01-27 | Disposition: A | Discharge status: Home / Self Care | Payer: BC Managed Care – PPO | Source: Ambulatory Visit | Attending: Neurosurgery | Admitting: Neurosurgery

## 2011-01-27 ENCOUNTER — Other Ambulatory Visit (HOSPITAL_COMMUNITY): Payer: Self-pay | Admitting: Neurosurgery

## 2011-01-27 ENCOUNTER — Other Ambulatory Visit (HOSPITAL_COMMUNITY): Payer: BC Managed Care – PPO

## 2011-01-27 DIAGNOSIS — M542 Cervicalgia: Secondary | ICD-10-CM | POA: Insufficient documentation

## 2011-01-27 DIAGNOSIS — M4712 Other spondylosis with myelopathy, cervical region: Secondary | ICD-10-CM

## 2011-02-18 ENCOUNTER — Other Ambulatory Visit: Payer: Self-pay | Admitting: Obstetrics and Gynecology

## 2011-02-18 DIAGNOSIS — Z1231 Encounter for screening mammogram for malignant neoplasm of breast: Secondary | ICD-10-CM

## 2011-02-23 ENCOUNTER — Encounter: Payer: Self-pay | Admitting: Internal Medicine

## 2011-02-23 ENCOUNTER — Ambulatory Visit (INDEPENDENT_AMBULATORY_CARE_PROVIDER_SITE_OTHER): Payer: BC Managed Care – PPO | Admitting: Internal Medicine

## 2011-02-23 DIAGNOSIS — R142 Eructation: Secondary | ICD-10-CM

## 2011-02-23 DIAGNOSIS — Z136 Encounter for screening for cardiovascular disorders: Secondary | ICD-10-CM

## 2011-02-23 DIAGNOSIS — R1032 Left lower quadrant pain: Secondary | ICD-10-CM

## 2011-02-23 DIAGNOSIS — R141 Gas pain: Secondary | ICD-10-CM

## 2011-02-23 DIAGNOSIS — Z0000 Encounter for general adult medical examination without abnormal findings: Secondary | ICD-10-CM

## 2011-02-23 DIAGNOSIS — Z23 Encounter for immunization: Secondary | ICD-10-CM

## 2011-02-23 MED ORDER — RANITIDINE HCL 150 MG PO TABS: 150.0000 mg | ORAL_TABLET | Freq: Two times a day (BID) | ORAL | Status: DC

## 2011-02-23 NOTE — Patient Instructions (Signed)
Preventive Health Care: Exercise  30-45  minutes a day, 3-4 days a week. Walking is especially valuable in preventing Osteoporosis. Eat a low-fat diet with lots of fruits and vegetables, up to 7-9 servings per day. Consume less than 30 grams of sugar per day from foods & drinks with High Fructose Corn Syrup as # 1,2,3 or #4 on label. The triggers for burping may   include stress; the "aspirin family" ; alcohol; peppermint; and caffeine (coffee, tea, cola, and chocolate). The aspirin family would include aspirin and the nonsteroidal agents such as ibuprofen &  Naproxen. Tylenol would not cause this. Food & drink should be avoided for @ least 2 hours before going to bed.  Please  schedule fasting Labs : BMET,Lipids, hepatic panel, CBC & dif, TSH(V70.0).  Please bring these instructions to that Lab appt.  Complete stool cards

## 2011-02-23 NOTE — Progress Notes (Signed)
Addended by: Edgardo Roys on: 02/23/2011 02:34 PM   Modules accepted: Orders

## 2011-02-23 NOTE — Progress Notes (Signed)
Subjective:    Patient ID: Barbara Fuentes, female    DOB: Jun 15, 1965, 45 y.o.   MRN: [id number]  HPI  Barbara Fuentes  is here for a physical;acute issues include recurrent burping & intermittent LLQ pain.      Review of Systems ABDOMINAL PAIN: Onset: since pelvic exam early 9/12;no ovarian findings  Radiation: no  Severity: up to 8 Quality: quick & sharp Duration: seconds  Better with: no relievers  Worse with: no triggers except ?waist flexion Symptoms Nausea/Vomiting: no but burping  . No bloating Diarrhea: no  Constipation: no  Melena/BRBPR: no  Anorexia: no  Fever/Chills: no  Dysuria/pyuria/hematuria: no  Rash: no  Wt loss: no  Past Surgeries: no colonoscopy       Objective:   Physical Exam Gen.: Thin but healthy and well-nourished in appearance. Alert, appropriate and cooperative throughout exam. Head: Normocephalic without obvious abnormalities Eyes: No corneal or conjunctival inflammation noted. Pupils equal round reactive to light and accommodation. Fundal exam is benign without hemorrhages, exudate, papilledema. Extraocular motion intact. Vision grossly normal. Ears: External  ear exam reveals no significant lesions or deformities. Canals clear .TMs normal. Hearing is grossly normal bilaterally. Nose: External nasal exam reveals no deformity or inflammation. Nasal mucosa are pink and moist. No lesions or exudates noted. Septum  normal  Mouth: Oral mucosa and oropharynx reveal no lesions or exudates. Teeth in good repair. Neck: No deformities, masses, or tenderness noted. Range of motion markedly reduced. Thyroid normal. Lungs: Normal respiratory effort; chest expands symmetrically. Lungs are clear to auscultation without rales, wheezes, or increased work of breathing. Heart: Normal rate and rhythm. Normal S1 and S2. No gallop, click, or rub. s 4 w/o  murmur. Abdomen: Bowel sounds normal; abdomen soft and nontender. No masses, organomegaly or hernias noted. Aorta  palpable w/o AAA Genitalia: Dr Billy Coast   .                                                                                   Musculoskeletal/extremities: No deformity or scoliosis noted of  the thoracic or lumbar spine. No clubbing, cyanosis, edema, or deformity noted. Range of motion  normal .Tone & strength  normal.Joints normal. Nail health  Good. Minor crepitus L knee Vascular: Carotid, radial artery, dorsalis pedis and  posterior tibial pulses are full and equal. No bruits present. Neurologic: Alert and oriented x3. Deep tendon reflexes symmetrical and normal.          Skin: Intact without suspicious lesions or rashes. Lymph: No cervical, axillary  lymphadenopathy present. Psych: Mood and affect are normal. Normally interactive                                                                                         Assessment & Plan:  #1 comprehensive physical exam; no acute findings #2 see  Problem List with Assessments & Recommendations  #3 intermittent left lower quadrant discomfort, questionably positional. Negative exam. No history of bloating #4 burping Plan: see Orders

## 2011-02-24 ENCOUNTER — Encounter: Payer: Self-pay | Admitting: Gastroenterology

## 2011-03-03 ENCOUNTER — Ambulatory Visit: Payer: BC Managed Care – PPO

## 2011-03-03 ENCOUNTER — Other Ambulatory Visit: Payer: Self-pay | Admitting: Internal Medicine

## 2011-03-03 DIAGNOSIS — Z0000 Encounter for general adult medical examination without abnormal findings: Secondary | ICD-10-CM

## 2011-03-04 ENCOUNTER — Other Ambulatory Visit: Payer: BC Managed Care – PPO

## 2011-03-16 ENCOUNTER — Other Ambulatory Visit: Payer: BC Managed Care – PPO

## 2011-03-17 ENCOUNTER — Ambulatory Visit
Admission: RE | Admit: 2011-03-17 | Discharge: 2011-03-17 | Disposition: A | Discharge status: Home / Self Care | Payer: BC Managed Care – PPO | Source: Ambulatory Visit | Attending: Obstetrics and Gynecology | Admitting: Obstetrics and Gynecology

## 2011-03-17 DIAGNOSIS — Z1231 Encounter for screening mammogram for malignant neoplasm of breast: Secondary | ICD-10-CM

## 2011-03-24 ENCOUNTER — Other Ambulatory Visit: Payer: Self-pay | Admitting: Internal Medicine

## 2011-03-24 ENCOUNTER — Ambulatory Visit: Payer: BC Managed Care – PPO | Admitting: Gastroenterology

## 2011-03-24 DIAGNOSIS — Z0000 Encounter for general adult medical examination without abnormal findings: Secondary | ICD-10-CM

## 2011-03-25 ENCOUNTER — Other Ambulatory Visit (INDEPENDENT_AMBULATORY_CARE_PROVIDER_SITE_OTHER): Payer: BC Managed Care – PPO

## 2011-03-25 DIAGNOSIS — Z0000 Encounter for general adult medical examination without abnormal findings: Secondary | ICD-10-CM

## 2011-03-25 LAB — LIPID PANEL
HDL: 71.3 mg/dL (ref >39.00)
Total CHOL/HDL Ratio: 2
Triglycerides: 39 mg/dL (ref 0.0–149.0)
VLDL: 7.8 mg/dL (ref 0.0–40.0)

## 2011-03-25 LAB — CBC WITH DIFFERENTIAL/PLATELET
Basophils Relative: 1.1 % (ref 0.0–3.0)
Eosinophils Relative: 3.1 % (ref 0.0–5.0)
HCT: 37.8 % (ref 36.0–46.0)
MCV: 92.3 fl (ref 78.0–100.0)
Monocytes Absolute: 0.5 10*3/uL (ref 0.1–1.0)
Monocytes Relative: 6.9 % (ref 3.0–12.0)
Neutrophils Relative %: 60.5 % (ref 43.0–77.0)
Platelets: 314 10*3/uL (ref 150.0–400.0)
RBC: 4.1 Mil/uL (ref 3.87–5.11)
WBC: 7.1 10*3/uL (ref 4.5–10.5)

## 2011-03-25 LAB — BASIC METABOLIC PANEL
CO2: 26 mEq/L (ref 19–32)
Glucose, Bld: 88 mg/dL (ref 70–99)
Potassium: 4.3 mEq/L (ref 3.5–5.1)
Sodium: 139 mEq/L (ref 135–145)

## 2011-03-25 LAB — HEPATIC FUNCTION PANEL
ALT: 13 U/L (ref 0–35)
AST: 16 U/L (ref 0–37)
Albumin: 4 g/dL (ref 3.5–5.2)
Total Bilirubin: 0.5 mg/dL (ref 0.3–1.2)

## 2011-03-25 LAB — TSH: TSH: 3.63 u[IU]/mL (ref 0.35–5.50)

## 2011-03-25 NOTE — Progress Notes (Signed)
12  

## 2012-04-27 DIAGNOSIS — M533 Sacrococcygeal disorders, not elsewhere classified: Secondary | ICD-10-CM

## 2012-04-27 HISTORY — DX: Sacrococcygeal disorders, not elsewhere classified: M53.3

## 2012-05-25 ENCOUNTER — Encounter: Payer: BC Managed Care – PPO | Admitting: Internal Medicine

## 2012-05-25 ENCOUNTER — Ambulatory Visit (INDEPENDENT_AMBULATORY_CARE_PROVIDER_SITE_OTHER): Payer: BC Managed Care – PPO | Admitting: Internal Medicine

## 2012-05-25 ENCOUNTER — Encounter: Payer: Self-pay | Admitting: Internal Medicine

## 2012-05-25 VITALS — BP 116/68 | HR 73 | Temp 98.3°F | Resp 12 | Ht 67.5 in | Wt 151.6 lb

## 2012-05-25 DIAGNOSIS — Z23 Encounter for immunization: Secondary | ICD-10-CM

## 2012-05-25 DIAGNOSIS — Z0000 Encounter for general adult medical examination without abnormal findings: Secondary | ICD-10-CM

## 2012-05-25 DIAGNOSIS — J45909 Unspecified asthma, uncomplicated: Secondary | ICD-10-CM

## 2012-05-25 NOTE — Patient Instructions (Addendum)
Preventive Health Care: Exercise  30-45  minutes a day, 3-4 days a week. Walking is especially valuable in preventing Osteoporosis. Eat a low-fat diet with lots of fruits and vegetables, up to 7-9 servings per day. Consume less than 30 grams of sugar per day from foods & drinks with High Fructose Corn Syrup as #1,2,3 or #4 on label. Please  consider fasting Labs : BMET,Lipids, hepatic panel, CBC & dif, TSH. PLEASE BRING THESE INSTRUCTIONS TO FOLLOW UP  LAB APPOINTMENT.This will guarantee correct labs are drawn, eliminating need for repeat blood sampling ( needle sticks ! ). Diagnoses /Codes: V70.0. As per the Standard of Care , screening Colonoscopy recommended @ 50 & every 5-10 years thereafter . Earlier or more frequent monitor would be dictated by family history (MGF colon cancer @ 91) or findings @ Colonoscopy  To prevent palpitations or premature beats, avoid stimulants such as decongestants, diet pills, nicotine, or caffeine (coffee, tea, cola, or chocolate) to excess.  If you activate My Chart; the results can be released to you as soon as they populate from the lab. If you choose not to use this program; the labs have to be reviewed, copied & mailed   causing a delay in getting the results to you.

## 2012-05-25 NOTE — Progress Notes (Signed)
  Subjective:    Patient ID: Barbara Fuentes, female    DOB: Mar 11, 1966, 47 y.o.   MRN: [id number]  HPI  Barbara Fuentes is here for a physical;acute issues intermittent palpitations @ rest over past year.      Review of Systems She  is on a heart healthy diet;she exercises 20 minutes 5-6 times per week intermittently without symptoms of related chest pain, palpitations, dyspnea, or claudication. Family history is negative for premature coronary disease.   Her only stimulant intake is 2&1/2  pots of coffee a day.    Objective:   Physical Exam Gen.: Thin but healthy and well-nourished in appearance. Alert, appropriate and cooperative throughout exam. Appears younger than stated age  Head: Normocephalic without obvious abnormalities  Eyes: No corneal or conjunctival inflammation noted. Pupils equal round reactive to light and accommodation. Fundal exam is benign without hemorrhages, exudate, papilledema. Extraocular motion intact. Vision grossly normal. Ears: External  ear exam reveals no significant lesions or deformities. Canals clear .TMs normal. Hearing is grossly normal bilaterally. Nose: External nasal exam reveals no deformity or inflammation. Nasal mucosa are pink and moist. No lesions or exudates noted.   Mouth: Oral mucosa and oropharynx reveal no lesions or exudates. Teeth in good repair. Neck: No deformities, masses, or tenderness noted. Range of motion decreased. Thyroid normal. Lungs: Normal respiratory effort; chest expands symmetrically. Lungs are clear to auscultation without rales, wheezes, or increased work of breathing. Heart: Normal rate and rhythm. Normal S1 and S2. No gallop, click, or rub. No murmur. Abdomen: Bowel sounds normal; abdomen soft and nontender. No masses, organomegaly or hernias noted. Genitalia: Dr. Billy Coast, Gyn                                 Musculoskeletal/extremities: No deformity or scoliosis noted of  the thoracic or lumbar spine. No clubbing, cyanosis,  edema, or significant extremity  deformity noted. Range of motion normal except neck .Tone & strength  normal.Joints normal. Nail health good. Able to lie down & sit up w/o help. Negative SLR bilaterally Vascular: Carotid, radial artery, dorsalis pedis and  posterior tibial pulses are full and equal. No bruits present. Neurologic: Alert and oriented x3. Deep tendon reflexes symmetrical and normal.  Skin: Intact without suspicious lesions or rashes. Lymph: No cervical, axillary lymphadenopathy present. Psych: Mood and affect are normal. Normally interactive                                                                                         Assessment & Plan:  #1 comprehensive physical exam; no acute findings #2 palpitations most likely due to caffeine excess  Plan: see Orders  & Recommendations

## 2012-07-25 ENCOUNTER — Other Ambulatory Visit: Payer: Self-pay | Admitting: Internal Medicine

## 2012-07-26 NOTE — Telephone Encounter (Signed)
Spoke with patient, patient with current problem in lower back (disk problem) , patient took these medications in the past and would like to restart. Patient aware Barbara Fuentes is out of office and refill to be addressed 07/27/12- patient ok with that   Hopp please advise

## 2012-07-27 NOTE — Telephone Encounter (Signed)
OK X1 but OVINB

## 2012-11-10 ENCOUNTER — Ambulatory Visit (INDEPENDENT_AMBULATORY_CARE_PROVIDER_SITE_OTHER): Payer: BC Managed Care – PPO | Admitting: Internal Medicine

## 2012-11-10 ENCOUNTER — Encounter: Payer: Self-pay | Admitting: Internal Medicine

## 2012-11-10 VITALS — BP 118/72 | HR 59 | Ht 66.08 in | Wt 146.0 lb

## 2012-11-10 DIAGNOSIS — Z0000 Encounter for general adult medical examination without abnormal findings: Secondary | ICD-10-CM

## 2012-11-10 LAB — ALKALINE PHOSPHATASE: Alkaline Phosphatase: 27 U/L — ABNORMAL LOW (ref 39–117)

## 2012-11-10 LAB — LIPID PANEL
Cholesterol: 169 mg/dL (ref 0–200)
Triglycerides: 53 mg/dL (ref 0.0–149.0)
VLDL: 10.6 mg/dL (ref 0.0–40.0)

## 2012-11-10 NOTE — Patient Instructions (Addendum)
Please consult your insurance company about the coverage for screening  Colonoscopy because of family history.  If you activate the  My Chart system; lab & Xray results will be released directly  to you as soon as I review & address these through the computer. If you choose not to sign up for My Chart within 36 hours of labs being drawn; results will be reviewed & interpretation added before being copied & mailed, causing a delay in getting the results to you.If you do not receive that report within 7-10 days ,please call. Additionally you can use this system to gain direct  access to your records  if  out of town or @ an office of a  physician who is not in  the My Chart network.  This improves continuity of care & places you in control of your medical record.

## 2012-11-10 NOTE — Progress Notes (Signed)
  Subjective:    Patient ID: Barbara Fuentes, female    DOB: 23-Sep-1965, 47 y.o.   MRN: [id number]  HPI  She is here for biometric screening as part of her employment benefit. This does require lipids and fasting blood sugar.  In reviewing her chart she has had decreased alkaline phosphatase in the past. Her TSH was also tenting upward.  There is a history of colon cancer in a gram father in his 28s. She has not had a colonoscopy to date    Review of Systems She is on a heart healthy diet; she  Has no regular exercise program. She denies chest pain, palpitations, dyspnea, or claudication.   She has no unexplained weight loss, abdominal pain, melena, or rectal bleeding. 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     Objective:   Physical Exam Appears healthy and well-nourished & in no acute distress  No carotid bruits are present.No neck pain distention present at 10 - 15 degrees. Thyroid normal to palpation  Heart rhythm and rate are normal with no significant murmurs or gallops. S4 with slurring  Chest is clear with no increased work of breathing  Aorta palpable but there is no evidence of aortic aneurysm or renal artery bruits  Abdomen soft with no organomegaly or masses. No HJR  No clubbing, cyanosis or edema present.  Pedal pulses are intact   No ischemic skin changes are present . Nails healthy    Alert and oriented. Strength, tone, DTRs reflexes normal          Assessment & Plan:  See Current Assessment & Plan in Problem List under specific Diagnosis

## 2012-11-10 NOTE — Assessment & Plan Note (Signed)
Check level 

## 2012-11-11 ENCOUNTER — Encounter: Payer: Self-pay | Admitting: *Deleted

## 2012-11-21 ENCOUNTER — Telehealth: Payer: Self-pay | Admitting: Internal Medicine

## 2012-11-21 NOTE — Telephone Encounter (Signed)
Form faxed to [phone] and to patient at [phone]

## 2012-11-21 NOTE — Telephone Encounter (Signed)
Patient is calling in regards to her Biometrics screening form for her health insurance. She came in on 11/10/12 and states that she left it for it to be signed and sent back to her. She is calling on the status of this request. Fax#: [phone]

## 2012-11-29 ENCOUNTER — Telehealth: Payer: Self-pay | Admitting: Internal Medicine

## 2012-11-29 NOTE — Telephone Encounter (Signed)
Patient left vm on triage line stating we filled out a form for her insurance, but it is not signed/dated by the doctor. Do you still have a copy of this?

## 2012-11-29 NOTE — Telephone Encounter (Signed)
Form was sent for scanning, I will look to see if formed scanned into system later this week

## 2012-12-02 NOTE — Telephone Encounter (Signed)
Left message on VM for patient informing her form signed and re-submitted

## 2013-01-05 ENCOUNTER — Encounter: Payer: Self-pay | Admitting: Family Medicine

## 2013-01-05 ENCOUNTER — Ambulatory Visit (INDEPENDENT_AMBULATORY_CARE_PROVIDER_SITE_OTHER): Payer: BC Managed Care – PPO | Admitting: Family Medicine

## 2013-01-05 VITALS — BP 118/78 | HR 62 | Temp 98.2°F | Wt 146.0 lb

## 2013-01-05 DIAGNOSIS — M533 Sacrococcygeal disorders, not elsewhere classified: Secondary | ICD-10-CM | POA: Insufficient documentation

## 2013-01-05 MED ORDER — NAPROXEN 500 MG PO TABS: 500.0000 mg | ORAL_TABLET | Freq: Two times a day (BID) | ORAL | Status: DC

## 2013-01-05 MED ORDER — CYCLOBENZAPRINE HCL 5 MG PO TABS: ORAL_TABLET | ORAL | Status: DC

## 2013-01-05 NOTE — Assessment & Plan Note (Signed)
New.  Pt injured 3-4 weeks ago while tubing.  No radicular pain.  Suspect bruised or cracked sacrum.  Start scheduled NSAIDs, flexeril for paraspinal spasm.  Reviewed supportive care and red flags that should prompt return.  Pt expressed understanding and is in agreement w/ plan.

## 2013-01-05 NOTE — Patient Instructions (Addendum)
This is a tailbone injury Start the Naproxen twice daily- w/ food- for 5-7 days and then as needed Use the flexeril for muscle spasm Heat or ice Call with any questions or concerns Hang in there!

## 2013-01-05 NOTE — Progress Notes (Signed)
  Subjective:    Patient ID: Barbara Fuentes, female    DOB: 1966-01-30, 46 y.o.   MRN: [id number]  HPI Back pain- hx of DDD of cervical spine s/p surgery.  ~1 month ago was tubing and hit tailbone on rock.  Now having LBP.  'debilitating as of last weekend'- was unable to get out of bed.  Lying flat is most painful.  No radiation of pain.  No weakness or numbness of legs.  No bowel or bladder incontinence.  Using flexeril nightly w/ some relief.  'dramatically improved 2 days ago'.   Review of Systems For ROS see HPI     Objective:   Physical Exam  Vitals reviewed. Constitutional: She is oriented to person, place, and time. She appears well-developed and well-nourished. No distress.  Cardiovascular: Intact distal pulses.   Musculoskeletal:  + TTP over sacrum w/ paraspinal spasm No pain w/ flexion/extension (-) SLR bilaterally  Neurological: She is alert and oriented to person, place, and time. She has normal reflexes. No cranial nerve deficit. Coordination normal.  Skin: Skin is warm and dry.  Psychiatric: She has a normal mood and affect. Her behavior is normal.          Assessment & Plan:

## 2013-01-10 ENCOUNTER — Ambulatory Visit: Payer: BC Managed Care – PPO | Admitting: Internal Medicine

## 2013-03-02 ENCOUNTER — Other Ambulatory Visit: Payer: Self-pay

## 2013-05-29 ENCOUNTER — Ambulatory Visit: Payer: BC Managed Care – PPO | Admitting: Internal Medicine

## 2013-12-05 ENCOUNTER — Other Ambulatory Visit (INDEPENDENT_AMBULATORY_CARE_PROVIDER_SITE_OTHER): Payer: BC Managed Care – PPO

## 2013-12-05 ENCOUNTER — Ambulatory Visit (INDEPENDENT_AMBULATORY_CARE_PROVIDER_SITE_OTHER): Payer: BC Managed Care – PPO | Admitting: Internal Medicine

## 2013-12-05 ENCOUNTER — Encounter: Payer: Self-pay | Admitting: Internal Medicine

## 2013-12-05 VITALS — BP 93/67 | HR 66 | Temp 97.9°F | Wt 153.4 lb

## 2013-12-05 DIAGNOSIS — Z9189 Other specified personal risk factors, not elsewhere classified: Secondary | ICD-10-CM

## 2013-12-05 DIAGNOSIS — Z87898 Personal history of other specified conditions: Secondary | ICD-10-CM

## 2013-12-05 DIAGNOSIS — G4730 Sleep apnea, unspecified: Secondary | ICD-10-CM

## 2013-12-05 DIAGNOSIS — J309 Allergic rhinitis, unspecified: Secondary | ICD-10-CM

## 2013-12-05 LAB — TSH: TSH: 3.23 u[IU]/mL (ref 0.35–4.50)

## 2013-12-05 NOTE — Patient Instructions (Signed)

## 2013-12-05 NOTE — Progress Notes (Signed)
   Subjective:    Patient ID: Barbara Fuentes, female    DOB: 1965/06/23, 48 y.o.   MRN: [id number]  HPI  For years she's had some snoring and possible intermittent minor apneic spells but this has gotten much worse recently. This is associated with daytime fatigue. She denies any motor vehicle accidents or neuromuscular impairment.  She describes herself as waking up gasping.  She has significant extrinsic symptoms only when she is around cats but otherwise has chronic nasal congestion. She's been taking Zyrtec with only partial relief  She concerned because her weight does not drop despite walking working out 7 hours a week.    Review of Systems Pertinent negative or absent signs and symptoms are as follows: Constitutional: No change in appetite. Eye: no blurred, double ,loss of vision. She does need reading glasses. Cardiovascular: no palpitations; racing; irregularity ENT/GI: no constipation; diarrhea;hoarseness;dysphagia Derm: no change in nails,hair,skin Neuro: no numbness or tingling; tremor Psych:no anxiety; depression; panic attacks Endo: no temperature intolerance to heat ,cold      Objective:   Physical Exam Pertinent positive findings include: Marked congestion of the nares with bilateral obstruction to airflow.   Slightly crowded oro pharynx is present. There is significant decrease range of motion of the cervical spine.  General appearance:good health ;well nourished; no acute distress or increased work of breathing is present.  No  lymphadenopathy about the head, neck, or axilla noted.   Eyes: No conjunctival inflammation or lid edema is present. There is no scleral icterus.  Ears:  External ear exam shows no significant lesions or deformities.  Otoscopic examination reveals clear canals, tympanic membranes are intact bilaterally without bulging, retraction, inflammation or discharge.  Nose:  External nasal examination shows no deformity or inflammation.  No  septal dislocation or deviation.  Oral exam: Dental hygiene is good; lips and gums are healthy appearing.There is no oropharyngeal erythema or exudate noted.   Neck:  No deformities, thyromegaly, masses, or tenderness noted.      Heart:  Normal rate and regular rhythm. S1 and S2 normal without gallop, murmur, click, rub or other extra sounds.   Lungs:Chest clear to auscultation; no wheezes, rhonchi,rales ,or rubs present.No increased work of breathing.    Extremities:  No cyanosis, edema, or clubbing  noted    Skin: Warm & dry         Assessment & Plan:  #1 history of snoring and apnea  #2 chronic nasal congestion with airflow obstruction  #3 inability to lose weight  Plan: See orders and recommendations

## 2013-12-05 NOTE — Progress Notes (Signed)
Pre visit review using our clinic review tool, if applicable. No additional management support is needed unless otherwise documented below in the visit note. 

## 2014-02-25 DIAGNOSIS — H6990 Unspecified Eustachian tube disorder, unspecified ear: Secondary | ICD-10-CM

## 2014-02-25 DIAGNOSIS — H6980 Other specified disorders of Eustachian tube, unspecified ear: Secondary | ICD-10-CM

## 2014-02-25 HISTORY — DX: Unspecified eustachian tube disorder, unspecified ear: H69.90

## 2014-02-25 HISTORY — DX: Other specified disorders of Eustachian tube, unspecified ear: H69.80

## 2014-02-27 ENCOUNTER — Ambulatory Visit (INDEPENDENT_AMBULATORY_CARE_PROVIDER_SITE_OTHER): Payer: BC Managed Care – PPO | Admitting: Physician Assistant

## 2014-02-27 ENCOUNTER — Encounter: Payer: Self-pay | Admitting: Physician Assistant

## 2014-02-27 VITALS — BP 109/63 | HR 55 | Temp 98.3°F | Resp 16 | Ht 67.0 in | Wt 153.5 lb

## 2014-02-27 DIAGNOSIS — H6502 Acute serous otitis media, left ear: Secondary | ICD-10-CM

## 2014-02-27 DIAGNOSIS — H6500 Acute serous otitis media, unspecified ear: Secondary | ICD-10-CM | POA: Insufficient documentation

## 2014-02-27 DIAGNOSIS — H6992 Unspecified Eustachian tube disorder, left ear: Secondary | ICD-10-CM

## 2014-02-27 DIAGNOSIS — H6990 Unspecified Eustachian tube disorder, unspecified ear: Secondary | ICD-10-CM | POA: Insufficient documentation

## 2014-02-27 MED ORDER — PREDNISONE 20 MG PO TABS: 40.0000 mg | ORAL_TABLET | Freq: Every day | ORAL | Status: DC

## 2014-02-27 NOTE — Assessment & Plan Note (Signed)
Unresponsive to conservative measures.  Continue Flonase and Zyrtec.  Rx Prednisone 3 day burst to help alleviate fluid and pressure.  If persisting, will need evaluation by ENT.

## 2014-02-27 NOTE — Progress Notes (Signed)
Pre visit review using our clinic review tool, if applicable. No additional management support is needed unless otherwise documented below in the visit note/SLS  

## 2014-02-27 NOTE — Progress Notes (Signed)
Patient presents to clinic today c/o pressure and fullness behind left ear over the past 2 weeks.  Denies vertigo or tinnitus.  Has history of seasonal allergies for which she take a daily Zyrtec and Flonase.  Past Medical History  Diagnosis Date  . RAD (reactive airway disease)      FORMER smoker , RAD with RTI's, cat or allergen exposure  . Alkaline phosphatase deficiency     28 in 9/11    Current Outpatient Prescriptions on File Prior to Visit  Medication Sig Dispense Refill  . cyclobenzaprine (FLEXERIL) 5 MG tablet TAKE 1 TABLET BY MOUTH TWICE DAILY AND 1 TO 2 TABS AT BEDTIME ASNEEDED ONLY. 30 tablet 0   No current facility-administered medications on file prior to visit.    Allergies  Allergen Reactions  . Codeine     REACTION: nausea & vomiting  . Procaine Hcl     REACTION: tachycardia    Family History  Problem Relation Age of Onset  . Diabetes Father     Type II, AO  . Alcohol abuse Father   . Depression Father   . Depression Brother   . Heart attack Paternal Grandfather     died in late 1s  . Colon cancer Maternal Grandfather 34  . HIV Brother     AIDS  . COPD Paternal Grandmother   . Stroke Neg Hx   . Prostate cancer Father     History   Social History  . Marital Status: Married    Spouse Name: N/A    Number of Children: N/A  . Years of Education: N/A   Occupational History  . Sales Associate     Social History Main Topics  . Smoking status: Former Smoker    Quit date: 06/18/2010  . Smokeless tobacco: None     Comment: smoked age [age] up to 1 ppd  . Alcohol Use: 1.8 oz/week    3 Glasses of wine per week     Comment: socially  . Drug Use: No  . Sexual Activity: None   Other Topics Concern  . None   Social History Narrative   Review of Systems - See HPI.  All other ROS are negative.  BP 109/63 mmHg  Pulse 55  Temp(Src) 98.3 F (36.8 C) (Oral)  Resp 16  Ht 5\' 7"  (1.702 m)  Wt 153 lb 8 oz (69.627 kg)  BMI 24.04 kg/m2  SpO2  100%  LMP 02/13/2014  Physical Exam  Constitutional: She is well-developed, well-nourished, and in no distress.  HENT:  Head: Normocephalic and atraumatic.  Right Ear: Tympanic membrane, external ear and ear canal normal.  Left Ear: External ear and ear canal normal. Tympanic membrane is retracted. A middle ear effusion is present.  Nose: Nose normal. Right sinus exhibits no maxillary sinus tenderness and no frontal sinus tenderness. Left sinus exhibits no maxillary sinus tenderness and no frontal sinus tenderness.  Mouth/Throat: Uvula is midline, oropharynx is clear and moist and mucous membranes are normal. No oropharyngeal exudate.  Eyes: Conjunctivae are normal. Pupils are equal, round, and reactive to light.  Cardiovascular: Normal rate, regular rhythm, normal heart sounds and intact distal pulses.   Pulmonary/Chest: Effort normal and breath sounds normal.  Skin: Skin is warm and dry. No rash noted.  Vitals reviewed.   Recent Results (from the past 2160 hour(s))  TSH     Status: None   Collection Time: 12/05/13  3:49 PM  Result Value Ref Range   TSH 3.23  0.35 - 4.50 uIU/mL    Assessment/Plan: Acute serous otitis media Unresponsive to conservative measures.  Continue Flonase and Zyrtec.  Rx Prednisone 3 day burst to help alleviate fluid and pressure.  If persisting, will need evaluation by ENT.

## 2014-02-27 NOTE — Patient Instructions (Signed)
Please take steroid as directed for 3 days.  If symptoms are not completely resolved, you may finish course of steroid.  Continue zyrtec and Nasocort daily.  Limit salt intake.  If symptoms persist, may need to set you up with a ENT physician for further management.

## 2014-07-27 LAB — HM PAP SMEAR

## 2014-07-27 LAB — HM MAMMOGRAPHY: HM MAMMO: NORMAL (ref 0–4)

## 2014-08-07 ENCOUNTER — Encounter: Payer: Self-pay | Admitting: Internal Medicine

## 2014-08-07 ENCOUNTER — Other Ambulatory Visit (INDEPENDENT_AMBULATORY_CARE_PROVIDER_SITE_OTHER): Payer: BLUE CROSS/BLUE SHIELD

## 2014-08-07 ENCOUNTER — Ambulatory Visit (INDEPENDENT_AMBULATORY_CARE_PROVIDER_SITE_OTHER): Payer: BLUE CROSS/BLUE SHIELD | Admitting: Internal Medicine

## 2014-08-07 VITALS — BP 110/68 | HR 83 | Temp 98.4°F | Resp 14 | Ht 67.0 in | Wt 160.8 lb

## 2014-08-07 DIAGNOSIS — Z0000 Encounter for general adult medical examination without abnormal findings: Secondary | ICD-10-CM

## 2014-08-07 DIAGNOSIS — Z0189 Encounter for other specified special examinations: Secondary | ICD-10-CM

## 2014-08-07 LAB — HEPATIC FUNCTION PANEL
ALK PHOS: 29 U/L — AB (ref 39–117)
ALT: 14 U/L (ref 0–35)
AST: 16 U/L (ref 0–37)
Albumin: 4.2 g/dL (ref 3.5–5.2)
BILIRUBIN DIRECT: 0.1 mg/dL (ref 0.0–0.3)
BILIRUBIN TOTAL: 0.4 mg/dL (ref 0.2–1.2)
Total Protein: 7.1 g/dL (ref 6.0–8.3)

## 2014-08-07 LAB — BASIC METABOLIC PANEL
BUN: 8 mg/dL (ref 6–23)
CALCIUM: 9.4 mg/dL (ref 8.4–10.5)
CO2: 29 mEq/L (ref 19–32)
Chloride: 104 mEq/L (ref 96–112)
Creatinine, Ser: 0.69 mg/dL (ref 0.40–1.20)
GFR: 95.82 mL/min (ref >60.00)
GLUCOSE: 104 mg/dL — AB (ref 70–99)
Potassium: 4.1 mEq/L (ref 3.5–5.1)
Sodium: 138 mEq/L (ref 135–145)

## 2014-08-07 LAB — TSH: TSH: 2.32 u[IU]/mL (ref 0.35–4.50)

## 2014-08-07 LAB — T4, FREE: Free T4: 1 ng/dL (ref 0.60–1.60)

## 2014-08-07 NOTE — Patient Instructions (Signed)
  Your next office appointment will be determined based upon review of your pending labs .Those instructions will be transmitted to you by My Chart   Critical results will be called.

## 2014-08-07 NOTE — Progress Notes (Signed)
Pre visit review using our clinic review tool, if applicable. No additional management support is needed unless otherwise documented below in the visit note. 

## 2014-08-07 NOTE — Progress Notes (Signed)
Subjective:    Patient ID: Barbara Fuentes, female    DOB: 1965-09-06, 49 y.o.   MRN: [id number]  HPI  She  is here for a physical;acute issues include concerns about weight gain despite therapeutic lifestyle interventions.  She is on a heart healthy diet; she exercises for 60 minutes at least 6 days a week without cardiopulmonary symptoms.  Family history is negative for premature heart attack or stroke. There is colon cancer in one grandparent in the 14s. She has no active GI symptoms .  She quit smoking 2012. She has labs monitored at work. These include lipids and glucose. Her serial lipids have been excellent indicating no long-term risk. The only abnormality has been reduced alkaline phosphatase.  She previously had some possible reactive airways and intermittent eustachian tube dysfunction and possible serous otitis media. These have resolved with nasal hygiene.   Review of Systems   Chest pain, palpitations, tachycardia, exertional dyspnea, paroxysmal nocturnal dyspnea, claudication or edema are absent.  Unexplained weight loss, abdominal pain, significant dyspepsia, dysphagia, melena, rectal bleeding, or persistently small caliber stools are denied.  Dysuria, pyuria, hematuria, frequency, or polyuria are denied.Nocturia X 1.    Objective:   Physical Exam  Gen.: Adequately nourished in appearance. Alert, appropriate and cooperative throughout exam. BMI: 25.17 Appears younger than stated age  Head: Normocephalic without obvious abnormalities  Eyes: No corneal or conjunctival inflammation noted. Pupils equal round reactive to light and accommodation. Extraocular motion intact.  Ears: External  ear exam reveals no significant lesions or deformities. Canals clear .TMs normal. Hearing is grossly normal bilaterally. Nose: External nasal exam reveals no deformity or inflammation. Nasal mucosa are pink and moist. No lesions or exudates noted.   Mouth: Oral mucosa and oropharynx  reveal no lesions or exudates. Teeth in good repair. Neck: No deformities, masses, or tenderness noted. Range of motion decreased. Thyroid normal.. Lungs: Normal respiratory effort; chest expands symmetrically. Lungs are clear to auscultation without rales, wheezes, or increased work of breathing. Heart: Normal rate and rhythm. Normal S1 and S2. No gallop, click, or rub. No murmur. Abdomen: Bowel sounds normal; abdomen soft and nontender. No masses, organomegaly or hernias noted.Aorta palpable ; no AAA Genitalia: as per Gyn                                  Musculoskeletal/extremities: No deformity or scoliosis noted of  the thoracic or lumbar spine.  No clubbing, cyanosis, edema, or significant extremity  deformity noted.  Range of motion normal . Tone & strength normal. Hand joints normal.  Fingernail  health good. Minor crepitus of knees  Able to lie down & sit up w/o help.  Negative SLR bilaterally Vascular: Carotid, radial artery, dorsalis pedis and  posterior tibial pulses are full and equal. No bruits present. Neurologic: Alert and oriented x3. Deep tendon reflexes symmetrical and normal.  Gait normal       Skin: Intact without suspicious lesions or rashes. Lymph: No cervical, axillary  lymphadenopathy present. Psych: Mood and affect are normal. Normally interactive  Assessment & Plan:  #1 comprehensive physical exam; no acute findings  Plan: see Orders  & Recommendations

## 2014-08-07 NOTE — Progress Notes (Signed)
   Subjective:    Patient ID: Barbara Fuentes, female    DOB: [birth year], 49 y.o.   MRN: [id number]  HPI  Fasting labs done at work included total cholesterol 176; HDL 69; and glucose 84. All within normal limits  Review of Systems     Objective:   Physical Exam        Assessment & Plan:

## 2014-08-08 ENCOUNTER — Telehealth: Payer: Self-pay

## 2014-08-08 DIAGNOSIS — R7309 Other abnormal glucose: Secondary | ICD-10-CM

## 2014-08-08 NOTE — Telephone Encounter (Signed)
-----   Message from Hendricks Limes, MD sent at 08/07/2014  5:53 PM EDT ----- Please add A1c (R73.9)

## 2014-08-08 NOTE — Telephone Encounter (Signed)
Phone call to patient. She needs to come back to lab to have a1c drawn since that tube was not drawn at the initial time she had blood drawn. Order has been placed.

## 2014-08-10 ENCOUNTER — Other Ambulatory Visit (INDEPENDENT_AMBULATORY_CARE_PROVIDER_SITE_OTHER): Payer: BLUE CROSS/BLUE SHIELD

## 2014-08-10 DIAGNOSIS — R7309 Other abnormal glucose: Secondary | ICD-10-CM | POA: Diagnosis not present

## 2014-08-10 LAB — HEMOGLOBIN A1C: Hgb A1c MFr Bld: 5.4 % (ref 4.6–6.5)

## 2015-04-28 DIAGNOSIS — Z923 Personal history of irradiation: Secondary | ICD-10-CM

## 2015-04-28 HISTORY — DX: Personal history of irradiation: Z92.3

## 2015-08-22 ENCOUNTER — Telehealth: Payer: Self-pay | Admitting: Behavioral Health

## 2015-08-22 ENCOUNTER — Encounter: Payer: Self-pay | Admitting: Behavioral Health

## 2015-08-22 NOTE — Telephone Encounter (Signed)
Pre-Visit Call completed with patient and chart updated.   Pre-Visit Info documented in Specialty Comments under [employer].    

## 2015-08-23 ENCOUNTER — Encounter: Payer: Self-pay | Admitting: Family Medicine

## 2015-08-23 ENCOUNTER — Ambulatory Visit (INDEPENDENT_AMBULATORY_CARE_PROVIDER_SITE_OTHER): Payer: BLUE CROSS/BLUE SHIELD | Admitting: Family Medicine

## 2015-08-23 VITALS — BP 104/72 | HR 80 | Temp 98.8°F | Ht 67.0 in | Wt 144.4 lb

## 2015-08-23 DIAGNOSIS — T7840XD Allergy, unspecified, subsequent encounter: Secondary | ICD-10-CM

## 2015-08-23 DIAGNOSIS — Z8619 Personal history of other infectious and parasitic diseases: Secondary | ICD-10-CM

## 2015-08-23 DIAGNOSIS — Z8701 Personal history of pneumonia (recurrent): Secondary | ICD-10-CM

## 2015-08-23 DIAGNOSIS — Z1211 Encounter for screening for malignant neoplasm of colon: Secondary | ICD-10-CM

## 2015-08-23 DIAGNOSIS — Z0000 Encounter for general adult medical examination without abnormal findings: Secondary | ICD-10-CM | POA: Diagnosis not present

## 2015-08-23 DIAGNOSIS — N951 Menopausal and female climacteric states: Secondary | ICD-10-CM | POA: Insufficient documentation

## 2015-08-23 DIAGNOSIS — T7840XA Allergy, unspecified, initial encounter: Secondary | ICD-10-CM

## 2015-08-23 HISTORY — DX: Personal history of other infectious and parasitic diseases: Z86.19

## 2015-08-23 HISTORY — DX: Personal history of pneumonia (recurrent): Z87.01

## 2015-08-23 HISTORY — DX: Allergy, unspecified, initial encounter: T78.40XA

## 2015-08-23 NOTE — Progress Notes (Signed)
Pre visit review using our clinic review tool, if applicable. No additional management support is needed unless otherwise documented below in the visit note. 

## 2015-08-23 NOTE — Patient Instructions (Addendum)
Check with insurance to see if insurance will cover the shingles injection. Consider Singulair Medication.  Allergies An allergy is an abnormal reaction to a substance by the body's defense system (immune system). Allergies can develop at any age. WHAT CAUSES ALLERGIES? An allergic reaction happens when the immune system mistakenly reacts to a normally harmless substance, called an allergen, as if it were harmful. The immune system releases antibodies to fight the substance. Antibodies eventually release a chemical called histamine into the bloodstream. The release of histamine is meant to protect the body from infection, but it also causes discomfort. An allergic reaction can be triggered by:  Eating an allergen.  Inhaling an allergen.  Touching an allergen. WHAT TYPES OF ALLERGIES ARE THERE? There are many types of allergies. Common types include:  Seasonal allergies. People with this type of allergy are usually allergic to substances that are only present during certain seasons, such as molds and pollens.  Food allergies.  Drug allergies.  Insect allergies.  Animal dander allergies. WHAT ARE SYMPTOMS OF ALLERGIES? Possible allergy symptoms include:  Swelling of the lips, face, tongue, mouth, or throat.  Sneezing, coughing, or wheezing.  Nasal congestion.  Tingling in the mouth.  Rash.  Itching.  Itchy, red, swollen areas of skin (hives).  Watery eyes.  Vomiting.  Diarrhea.  Dizziness.  Lightheadedness.  Fainting.  Trouble breathing or swallowing.  Chest tightness.  Rapid heartbeat. HOW ARE ALLERGIES DIAGNOSED? Allergies are diagnosed with a medical and family history and one or more of the following:  Skin tests.  Blood tests.  A food diary. A food diary is a record of all the foods and drinks you have in a day and of all the symptoms you experience.  The results of an elimination diet. An elimination diet involves eliminating foods from your diet  and then adding them back in one by one to find out if a certain food causes an allergic reaction. HOW ARE ALLERGIES TREATED? There is no cure for allergies, but allergic reactions can be treated with medicine. Severe reactions usually need to be treated at a hospital. HOW CAN REACTIONS BE PREVENTED? The best way to prevent an allergic reaction is by avoiding the substance you are allergic to. Allergy shots and medicines can also help prevent reactions in some cases. People with severe allergic reactions may be able to prevent a life-threatening reaction called anaphylaxis with a medicine given right after exposure to the allergen.   This information is not intended to replace advice given to you by your health care provider. Make sure you discuss any questions you have with your health care provider.   Document Released: 07/07/2002 Document Revised: 05/04/2014 Document Reviewed: 01/23/2014 Elsevier Interactive Patient Education [employer].

## 2015-08-23 NOTE — Progress Notes (Signed)
Subjective:    Patient ID: Barbara Fuentes, female    DOB: 01-17-1966, 50 y.o.   MRN: [id number]  Chief Complaint  Patient presents with  . Establish Care    HPI Patient is in today to establish care as a new patient.  Patient does not have a any concerns.  Patient reports she has been having some hot flashes and believes she may be going through menopause but just stop missing period. Patient does report some seasonal allergies that cause some real bad brachial episodes takes otc zyrtec and nasal spray.  Denies CP/palp/SOB/HA/congestion/fevers/GI or GU c/o. Taking meds as prescribed. PMH includes allergies, perimenopause, asthma and IBS  Past Medical History  Diagnosis Date  . RAD (reactive airway disease)      FORMER smoker , RAD with RTI's, cat or allergen exposure  . Alkaline phosphatase deficiency     29 in 9/11  . Eustachian tube dysfunction 02/2014  . Serous otitis media 02/2014  . Sacral pain 2014    trauma ( fall)  . Preventative health care 08/23/2015  . Perimenopause 08/23/2015  . History of chicken pox 08/23/2015  . H/O recurrent pneumonia 08/23/2015  . Allergic state 08/23/2015    Past Surgical History  Procedure Laterality Date  . Tonsillectomy    . Cervical fusion  07/30/2010    & Discectomy, Dr Vertell Limber ( @ 3 levels)  . Septoplasty  1983    for septal deviation  . Uterine ablation  2008    Dr Ronita Hipps  . Wisdom tooth extraction      Family History  Problem Relation Age of Onset  . Diabetes Father     Type II, AO  . Alcohol abuse Father   . Depression Father   . Prostate cancer Father   . Depression Brother   . Heart attack Paternal Grandfather     died in late 48s  . Heart disease Paternal Grandfather   . Alcohol abuse Paternal Grandfather     smoker  . Colon cancer Maternal Grandfather 62  . Cancer Maternal Grandfather     colon, colostomy  . HIV Brother     AIDS  . COPD Paternal Grandmother   . Alcohol abuse Paternal Grandmother     smoker  .  Stroke Neg Hx   . Heart disease Mother     cardiac rest, epinephrine with Novocaine  . Arthritis Mother     Social History   Social History  . Marital Status: Married    Spouse Name: N/A  . Number of Children: N/A  . Years of Education: N/A   Occupational History  . Sales Associate     Social History Main Topics  . Smoking status: Former Smoker    Quit date: 06/18/2010  . Smokeless tobacco: Not on file     Comment: smoked age [age] up to 1 ppd  . Alcohol Use: 1.8 oz/week    3 Glasses of wine per week     Comment: socially  . Drug Use: No  . Sexual Activity: Not on file   Other Topics Concern  . Not on file   Social History Narrative    Outpatient Prescriptions Prior to Visit  Medication Sig Dispense Refill  . cetirizine (ZYRTEC) 10 MG tablet Take 10 mg by mouth daily.    . cyclobenzaprine (FLEXERIL) 5 MG tablet TAKE 1 TABLET BY MOUTH TWICE DAILY AND 1 TO 2 TABS AT BEDTIME ASNEEDED ONLY. 30 tablet 0  . fluticasone (FLONASE) 50 MCG/ACT  nasal spray Place 2 sprays into both nostrils daily.     No facility-administered medications prior to visit.    Allergies  Allergen Reactions  . Codeine     REACTION: nausea & vomiting  . Procaine Hcl     REACTION: tachycardia    Review of Systems  Constitutional: Negative for fever and malaise/fatigue.  HENT: Negative for congestion.   Eyes: Negative for blurred vision.  Respiratory: Negative for shortness of breath.   Cardiovascular: Negative for chest pain, palpitations and leg swelling.  Gastrointestinal: Negative for nausea, abdominal pain and blood in stool.  Genitourinary: Negative for dysuria and frequency.  Musculoskeletal: Negative for falls.  Skin: Negative for rash.  Neurological: Negative for dizziness, loss of consciousness and headaches.  Endo/Heme/Allergies: Positive for environmental allergies.  Psychiatric/Behavioral: Negative for depression. The patient is not nervous/anxious.        Objective:      Physical Exam  Constitutional: She is oriented to person, place, and time. She appears well-developed and well-nourished. No distress.  HENT:  Head: Normocephalic and atraumatic.  Eyes: Conjunctivae are normal.  Neck: Neck supple. No thyromegaly present.  Cardiovascular: Normal rate, regular rhythm and normal heart sounds.   No murmur heard. Pulmonary/Chest: Effort normal and breath sounds normal. No respiratory distress.  Abdominal: Soft. Bowel sounds are normal. She exhibits no distension and no mass. There is no tenderness.  Musculoskeletal: She exhibits no edema.  Lymphadenopathy:    She has no cervical adenopathy.  Neurological: She is alert and oriented to person, place, and time.  Skin: Skin is warm and dry.  Psychiatric: She has a normal mood and affect. Her behavior is normal.    BP 104/72 mmHg  Pulse 80  Temp(Src) 98.8 F (37.1 C) (Oral)  Ht 5\' 7"  (1.702 m)  Wt 144 lb 6 oz (65.488 kg)  BMI 22.61 kg/m2  SpO2 96% Wt Readings from Last 3 Encounters:  08/23/15 144 lb 6 oz (65.488 kg)  08/07/14 160 lb 12 oz (72.916 kg)  02/27/14 153 lb 8 oz (69.627 kg)     Lab Results  Component Value Date   WBC 7.1 03/25/2011   HGB 12.7 03/25/2011   HCT 37.8 03/25/2011   PLT 314.0 03/25/2011   GLUCOSE 104* 08/07/2014   CHOL 169 11/10/2012   TRIG 53.0 11/10/2012   HDL 75.70 11/10/2012   LDLCALC 83 11/10/2012   ALT 14 08/07/2014   AST 16 08/07/2014   NA 138 08/07/2014   K 4.1 08/07/2014   CL 104 08/07/2014   CREATININE 0.69 08/07/2014   BUN 8 08/07/2014   CO2 29 08/07/2014   TSH 2.32 08/07/2014   HGBA1C 5.4 08/10/2014    Lab Results  Component Value Date   TSH 2.32 08/07/2014   Lab Results  Component Value Date   WBC 7.1 03/25/2011   HGB 12.7 03/25/2011   HCT 37.8 03/25/2011   MCV 92.3 03/25/2011   PLT 314.0 03/25/2011   Lab Results  Component Value Date   NA 138 08/07/2014   K 4.1 08/07/2014   CO2 29 08/07/2014   GLUCOSE 104* 08/07/2014   BUN 8  08/07/2014   CREATININE 0.69 08/07/2014   BILITOT 0.4 08/07/2014   ALKPHOS 29* 08/07/2014   AST 16 08/07/2014   ALT 14 08/07/2014   PROT 7.1 08/07/2014   ALBUMIN 4.2 08/07/2014   CALCIUM 9.4 08/07/2014   GFR 95.82 08/07/2014   Lab Results  Component Value Date   CHOL 169 11/10/2012   Lab  Results  Component Value Date   HDL 75.70 11/10/2012   Lab Results  Component Value Date   LDLCALC 83 11/10/2012   Lab Results  Component Value Date   TRIG 53.0 11/10/2012   Lab Results  Component Value Date   CHOLHDL 2 11/10/2012   Lab Results  Component Value Date   HGBA1C 5.4 08/10/2014       Assessment & Plan:   Problem List Items Addressed This Visit    Preventative health care - Primary    Patient encouraged to maintain heart healthy diet, regular exercise, adequate sleep. Consider daily probiotics. Take medications as prescribed. Given and reviewed copy of ACP documents from [employer] and encouraged to complete and return      Perimenopause    Encouraged 8 hours of sleep, regular exercise, hi protein diet, low carb, small, frequent meals and probiotics      History of chicken pox   H/O recurrent pneumonia   Allergic state    Encouraged daily antihistamine, nasal steroid and probiotics.        Other Visit Diagnoses    Screening for colon cancer        Relevant Orders    Ambulatory referral to Gastroenterology       I am having Ms. Luevanos maintain her cyclobenzaprine, fluticasone, and cetirizine.  No orders of the defined types were placed in this encounter.     [name], MD

## 2015-08-27 ENCOUNTER — Other Ambulatory Visit: Payer: BLUE CROSS/BLUE SHIELD

## 2015-09-08 NOTE — Assessment & Plan Note (Signed)
Encouraged daily antihistamine, nasal steroid and probiotics.

## 2015-09-08 NOTE — Assessment & Plan Note (Signed)
Patient encouraged to maintain heart healthy diet, regular exercise, adequate sleep. Consider daily probiotics. Take medications as prescribed. Given and reviewed copy of ACP documents from [place] Secretary of State and encouraged to complete and return 

## 2015-09-08 NOTE — Assessment & Plan Note (Signed)
Encouraged 8 hours of sleep, regular exercise, hi protein diet, low carb, small, frequent meals and probiotics

## 2015-10-31 ENCOUNTER — Telehealth: Payer: Self-pay | Admitting: Family Medicine

## 2015-10-31 ENCOUNTER — Other Ambulatory Visit (INDEPENDENT_AMBULATORY_CARE_PROVIDER_SITE_OTHER): Payer: BLUE CROSS/BLUE SHIELD

## 2015-10-31 DIAGNOSIS — T7840XD Allergy, unspecified, subsequent encounter: Secondary | ICD-10-CM

## 2015-10-31 DIAGNOSIS — N951 Menopausal and female climacteric states: Secondary | ICD-10-CM | POA: Diagnosis not present

## 2015-10-31 DIAGNOSIS — Z0000 Encounter for general adult medical examination without abnormal findings: Secondary | ICD-10-CM

## 2015-10-31 LAB — CBC
HEMATOCRIT: 39 % (ref 36.0–46.0)
Hemoglobin: 13 g/dL (ref 12.0–15.0)
MCHC: 33.3 g/dL (ref 30.0–36.0)
MCV: 91.2 fl (ref 78.0–100.0)
Platelets: 348 10*3/uL (ref 150.0–400.0)
RBC: 4.28 Mil/uL (ref 3.87–5.11)
RDW: 13.3 % (ref 11.5–15.5)
WBC: 9.1 10*3/uL (ref 4.0–10.5)

## 2015-10-31 LAB — LIPID PANEL
CHOL/HDL RATIO: 3
Cholesterol: 179 mg/dL (ref 0–200)
HDL: 70.1 mg/dL (ref >39.00)
LDL Cholesterol: 96 mg/dL (ref 0–99)
NONHDL: 108.45
Triglycerides: 64 mg/dL (ref 0.0–149.0)
VLDL: 12.8 mg/dL (ref 0.0–40.0)

## 2015-10-31 LAB — COMPREHENSIVE METABOLIC PANEL
ALK PHOS: 32 U/L — AB (ref 39–117)
ALT: 15 U/L (ref 0–35)
AST: 16 U/L (ref 0–37)
Albumin: 4.2 g/dL (ref 3.5–5.2)
BILIRUBIN TOTAL: 0.4 mg/dL (ref 0.2–1.2)
BUN: 11 mg/dL (ref 6–23)
CO2: 26 meq/L (ref 19–32)
Calcium: 9.2 mg/dL (ref 8.4–10.5)
Chloride: 102 mEq/L (ref 96–112)
Creatinine, Ser: 0.66 mg/dL (ref 0.40–1.20)
GFR: 100.36 mL/min (ref >60.00)
GLUCOSE: 100 mg/dL — AB (ref 70–99)
Potassium: 4.3 mEq/L (ref 3.5–5.1)
SODIUM: 135 meq/L (ref 135–145)
TOTAL PROTEIN: 6.8 g/dL (ref 6.0–8.3)

## 2015-10-31 LAB — TSH: TSH: 3.09 u[IU]/mL (ref 0.35–4.50)

## 2015-10-31 NOTE — Telephone Encounter (Signed)
Form received.  Completed as much as possible and forwarded to Dr. Charlett Blake for review and signature.

## 2015-10-31 NOTE — Telephone Encounter (Signed)
Relation to [id number] Call back [place]:  Reason for call:  Patient dropped of [employer] form placed in [place]. Mailbox.

## 2015-12-16 ENCOUNTER — Other Ambulatory Visit: Payer: Self-pay | Admitting: Obstetrics and Gynecology

## 2015-12-16 DIAGNOSIS — R921 Mammographic calcification found on diagnostic imaging of breast: Secondary | ICD-10-CM

## 2015-12-18 ENCOUNTER — Other Ambulatory Visit: Payer: Self-pay | Admitting: Obstetrics and Gynecology

## 2015-12-18 ENCOUNTER — Ambulatory Visit
Admission: RE | Admit: 2015-12-18 | Discharge: 2015-12-18 | Disposition: A | Discharge status: Home / Self Care | Payer: BLUE CROSS/BLUE SHIELD | Source: Ambulatory Visit | Attending: Obstetrics and Gynecology | Admitting: Obstetrics and Gynecology

## 2015-12-18 DIAGNOSIS — R921 Mammographic calcification found on diagnostic imaging of breast: Secondary | ICD-10-CM

## 2015-12-19 ENCOUNTER — Ambulatory Visit
Admission: RE | Admit: 2015-12-19 | Discharge: 2015-12-19 | Disposition: A | Discharge status: Home / Self Care | Payer: BLUE CROSS/BLUE SHIELD | Source: Ambulatory Visit | Attending: Obstetrics and Gynecology | Admitting: Obstetrics and Gynecology

## 2015-12-19 DIAGNOSIS — R921 Mammographic calcification found on diagnostic imaging of breast: Secondary | ICD-10-CM

## 2015-12-19 HISTORY — PX: BREAST BIOPSY: SHX20

## 2015-12-20 ENCOUNTER — Telehealth: Payer: Self-pay | Admitting: *Deleted

## 2015-12-20 ENCOUNTER — Encounter: Payer: Self-pay | Admitting: *Deleted

## 2015-12-20 DIAGNOSIS — Z171 Estrogen receptor negative status [ER-]: Secondary | ICD-10-CM

## 2015-12-20 DIAGNOSIS — C50511 Malignant neoplasm of lower-outer quadrant of right female breast: Secondary | ICD-10-CM

## 2015-12-20 HISTORY — DX: Malignant neoplasm of lower-outer quadrant of right female breast: C50.511

## 2015-12-20 NOTE — Telephone Encounter (Signed)
Confirmed [facility] for 12/25/15 at 0815.  Instructions and contact information given.

## 2015-12-25 ENCOUNTER — Ambulatory Visit: Payer: BLUE CROSS/BLUE SHIELD | Admitting: Physical Therapy

## 2015-12-25 ENCOUNTER — Encounter: Payer: Self-pay | Admitting: *Deleted

## 2015-12-25 ENCOUNTER — Ambulatory Visit (HOSPITAL_BASED_OUTPATIENT_CLINIC_OR_DEPARTMENT_OTHER): Payer: BLUE CROSS/BLUE SHIELD | Admitting: Hematology and Oncology

## 2015-12-25 ENCOUNTER — Ambulatory Visit
Admission: RE | Admit: 2015-12-25 | Discharge: 2015-12-25 | Disposition: A | Discharge status: Home / Self Care | Payer: BLUE CROSS/BLUE SHIELD | Source: Ambulatory Visit | Attending: Radiation Oncology | Admitting: Radiation Oncology

## 2015-12-25 ENCOUNTER — Other Ambulatory Visit (HOSPITAL_BASED_OUTPATIENT_CLINIC_OR_DEPARTMENT_OTHER): Payer: BLUE CROSS/BLUE SHIELD

## 2015-12-25 ENCOUNTER — Encounter: Payer: Self-pay | Admitting: Genetic Counselor

## 2015-12-25 DIAGNOSIS — D0511 Intraductal carcinoma in situ of right breast: Secondary | ICD-10-CM

## 2015-12-25 DIAGNOSIS — C50511 Malignant neoplasm of lower-outer quadrant of right female breast: Secondary | ICD-10-CM

## 2015-12-25 DIAGNOSIS — Z171 Estrogen receptor negative status [ER-]: Secondary | ICD-10-CM

## 2015-12-25 LAB — COMPREHENSIVE METABOLIC PANEL
ALT: 20 U/L (ref 0–55)
AST: 19 U/L (ref 5–34)
Albumin: 3.8 g/dL (ref 3.5–5.0)
Alkaline Phosphatase: 32 U/L — ABNORMAL LOW (ref 40–150)
Anion Gap: 10 mEq/L (ref 3–11)
BUN: 10.5 mg/dL (ref 7.0–26.0)
CHLORIDE: 106 meq/L (ref 98–109)
CO2: 23 meq/L (ref 22–29)
Calcium: 9.5 mg/dL (ref 8.4–10.4)
Creatinine: 0.7 mg/dL (ref 0.6–1.1)
EGFR: >90 mL/min/{1.73_m2} (ref >90)
GLUCOSE: 87 mg/dL (ref 70–140)
POTASSIUM: 4.2 meq/L (ref 3.5–5.1)
SODIUM: 139 meq/L (ref 136–145)
Total Bilirubin: 0.34 mg/dL (ref 0.20–1.20)
Total Protein: 7.1 g/dL (ref 6.4–8.3)

## 2015-12-25 LAB — CBC WITH DIFFERENTIAL/PLATELET
BASO%: 1.1 % (ref 0.0–2.0)
BASOS ABS: 0.1 10*3/uL (ref 0.0–0.1)
EOS ABS: 0.2 10*3/uL (ref 0.0–0.5)
EOS%: 2.7 % (ref 0.0–7.0)
HCT: 39.4 % (ref 34.8–46.6)
HEMOGLOBIN: 13.2 g/dL (ref 11.6–15.9)
LYMPH%: 29.5 % (ref 14.0–49.7)
MCH: 30.5 pg (ref 25.1–34.0)
MCHC: 33.6 g/dL (ref 31.5–36.0)
MCV: 90.8 fL (ref 79.5–101.0)
MONO#: 0.6 10*3/uL (ref 0.1–0.9)
MONO%: 8.4 % (ref 0.0–14.0)
NEUT#: 4.5 10*3/uL (ref 1.5–6.5)
NEUT%: 58.3 % (ref 38.4–76.8)
Platelets: 323 10*3/uL (ref 145–400)
RBC: 4.34 10*6/uL (ref 3.70–5.45)
RDW: 13.3 % (ref 11.2–14.5)
WBC: 7.7 10*3/uL (ref 3.9–10.3)
lymph#: 2.3 10*3/uL (ref 0.9–3.3)

## 2015-12-25 NOTE — Progress Notes (Signed)
[place] NOTE  Patient Care Team: [name], MD as PCP - General (Family Medicine) Brien Few, MD as Consulting Physician (Obstetrics and Gynecology) Willow Ora as Consulting Physician (Optometry) Nani Ravens, MD as Consulting Physician (Dermatology) [name], MD as Consulting Physician (General Surgery) Nicholas Lose, MD as Consulting Physician (Hematology and Oncology) Eppie Gibson, MD as Attending Physician (Radiation Oncology)  CHIEF COMPLAINTS/PURPOSE OF CONSULTATION:  Newly diagnosed breast cancer  HISTORY OF PRESENTING ILLNESS:  Barbara Fuentes 50 y.o. female is here because of recent diagnosis of right breast DCIS. Patient had a routine screening mammogram that revealed a 2 cm group of calcifications. This was further evaluated by ultrasound biopsy was performed. Biopsy revealed DCIS that was ER/PR negative. She is here today accompanied by her friend to discuss a treatment, then multidisciplinary clinic.  I reviewed her records extensively and collaborated the history with the patient.  SUMMARY OF ONCOLOGIC HISTORY:   Breast cancer of lower-outer quadrant of right female breast ([place])   12/18/2015 Mammogram    Right breast: 2 cm group of suspicious calcifications within the outer right breast      12/19/2015 Initial Diagnosis    Right breast DCIS with calcifications, complex sclerosing lesion, ER 0%, PR 0%, Tis N0 stage 0       MEDICAL HISTORY:  Past Medical History:  Diagnosis Date  . Alkaline phosphatase deficiency    29 in 9/11  . Allergic state 08/23/2015  . Breast cancer of lower-outer quadrant of right female breast ([place]) 12/20/2015  . Eustachian tube dysfunction 02/2014  . H/O recurrent pneumonia 08/23/2015  . History of chicken pox 08/23/2015  . Perimenopause 08/23/2015  . Preventative health care 08/23/2015  . RAD (reactive airway disease)     FORMER smoker , RAD with RTI's, cat or allergen exposure  . Sacral pain  2014   trauma ( fall)  . Serous otitis media 02/2014    SURGICAL HISTORY: Past Surgical History:  Procedure Laterality Date  . CERVICAL FUSION  07/30/2010   & Discectomy, Dr Vertell Limber ( @ 3 levels)  . SEPTOPLASTY  1983   for septal deviation  . TONSILLECTOMY    . uterine ablation  2008   Dr Ronita Hipps  . WISDOM TOOTH EXTRACTION      SOCIAL HISTORY: Social History   Social History  . Marital status: Married    Spouse name: N/A  . Number of children: N/A  . Years of education: N/A   Occupational History  . Sales Associate     Social History Main Topics  . Smoking status: Former Smoker    Quit date: 06/18/2010  . Smokeless tobacco: Not on file     Comment: smoked age [age] up to 1 ppd  . Alcohol use 1.8 oz/week    3 Glasses of wine per week     Comment: socially  . Drug use: No  . Sexual activity: Not on file   Other Topics Concern  . Not on file   Social History Narrative  . No narrative on file    FAMILY HISTORY: Family History  Problem Relation Age of Onset  . Diabetes Father     Type II, AO  . Alcohol abuse Father   . Depression Father   . Prostate cancer Father   . Depression Brother   . Heart attack Paternal Grandfather     died in late 67s  . Heart disease Paternal Grandfather   . Alcohol abuse Paternal Grandfather  smoker  . Colon cancer Maternal Grandfather 67  . Cancer Maternal Grandfather     colon, colostomy  . COPD Paternal Grandmother   . Alcohol abuse Paternal Grandmother     smoker  . Heart disease Mother     cardiac rest, epinephrine with Novocaine  . Arthritis Mother   . HIV Brother     AIDS  . Stroke Neg Hx     ALLERGIES:  is allergic to codeine and procaine hcl.  MEDICATIONS:  Current Outpatient Prescriptions  Medication Sig Dispense Refill  . cetirizine (ZYRTEC) 10 MG tablet Take 10 mg by mouth daily.    . cyclobenzaprine (FLEXERIL) 5 MG tablet TAKE 1 TABLET BY MOUTH TWICE DAILY AND 1 TO 2 TABS AT BEDTIME ASNEEDED ONLY. 30  tablet 0  . fluticasone (FLONASE) 50 MCG/ACT nasal spray Place 2 sprays into both nostrils daily.     No current facility-administered medications for this visit.     REVIEW OF SYSTEMS:   Constitutional: Denies fevers, chills or abnormal night sweats Eyes: Denies blurriness of vision, double vision or watery eyes Ears, nose, mouth, throat, and face: Denies mucositis or sore throat Respiratory: Denies cough, dyspnea or wheezes Cardiovascular: Denies palpitation, chest discomfort or lower extremity swelling Gastrointestinal:  Denies nausea, heartburn or change in bowel habits Skin: Denies abnormal skin rashes Lymphatics: Denies new lymphadenopathy or easy bruising Neurological:Denies numbness, tingling or new weaknesses Behavioral/Psych: Mood is stable, no new changes  Breast:  Denies any palpable lumps or discharge All other systems were reviewed with the patient and are negative.  PHYSICAL EXAMINATION: ECOG PERFORMANCE STATUS: 0 - Asymptomatic  Vitals:   12/25/15 0852  BP: 110/76  Pulse: 60  Resp: 18  Temp: 98.5 F (36.9 C)   Filed Weights   12/25/15 0852  Weight: 147 lb 8 oz (66.9 kg)    GENERAL:alert, no distress and comfortable SKIN: skin color, texture, turgor are normal, no rashes or significant lesions EYES: normal, conjunctiva are pink and non-injected, sclera clear OROPHARYNX:no exudate, no erythema and lips, buccal mucosa, and tongue normal  NECK: supple, thyroid normal size, non-tender, without nodularity LYMPH:  no palpable lymphadenopathy in the cervical, axillary or inguinal LUNGS: clear to auscultation and percussion with normal breathing effort HEART: regular rate & rhythm and no murmurs and no lower extremity edema ABDOMEN:abdomen soft, non-tender and normal bowel sounds Musculoskeletal:no cyanosis of digits and no clubbing  PSYCH: alert & oriented x 3 with fluent speech NEURO: no focal motor/sensory deficits BREAST: No palpable nodules in breast. No  palpable axillary or supraclavicular lymphadenopathy (exam performed in the presence of a chaperone)   LABORATORY DATA:  I have reviewed the data as listed Lab Results  Component Value Date   WBC 7.7 12/25/2015   HGB 13.2 12/25/2015   HCT 39.4 12/25/2015   MCV 90.8 12/25/2015   PLT 323 12/25/2015   Lab Results  Component Value Date   NA 139 12/25/2015   K 4.2 12/25/2015   CL 102 10/31/2015   CO2 23 12/25/2015    ASSESSMENT AND PLAN:  Breast cancer of lower-outer quadrant of right female breast ([place]) 12/19/2015: Right breast DCIS with calcifications, complex sclerosing lesion, ER 0%, PR 0%, Tis N0 stage 0  Pathology review: I discussed with the patient the difference between DCIS and invasive breast cancer. It is considered a precancerous lesion. DCIS is classified as a 0. It is generally detected through mammograms as calcifications. We discussed the significance of grades and its impact on prognosis.  We also discussed the importance of ER and PR receptors and their implications to adjuvant treatment options. Prognosis of DCIS dependence on grade, comedo necrosis. It is anticipated that if not treated, 20-30% of DCIS can develop into invasive breast cancer.  Recommendation: 1. Genetic counseling 2. Breast conserving surgery 3. Followed by adjuvant radiation therapy  Return to clinic after surgery to discuss the final pathology report and come up with an adjuvant treatment plan.     All questions were answered. The patient knows to call the clinic with any problems, questions or concerns.    Rulon Eisenmenger, MD 12/25/15

## 2015-12-25 NOTE — Progress Notes (Signed)
Clinical Social Work [place] Psychosocial Distress Screening [place]  Patient completed distress screening protocol and scored a 0 on the Psychosocial Distress Thermometer which indicates no distress. Clinical Social Worker met with patient and patients friend in [facility] to assess for distress and other psychosocial needs. Patient stated she felt positive after meeting with the treatment team and getting more information on her treatment plan. CSW and patient discussed common feeling and emotions when being diagnosed with cancer, and the importance of support during treatment. CSW informed patient of the support team and support services at [facility]. CSW provided contact information and encouraged patient to call with any questions or concerns.  [facility] DISTRESS SCREENING 12/25/2015  Screening Type Initial Screening  Distress experienced in past week (1-10) 0     Johnnye Lana, MSW, LCSW, OSW-C Clinical Social Worker [place] [phone]

## 2015-12-25 NOTE — Assessment & Plan Note (Signed)
12/19/2015: Right breast DCIS with calcifications, complex sclerosing lesion, ER 0%, PR 0%, Tis N0 stage 0  Pathology review: I discussed with the patient the difference between DCIS and invasive breast cancer. It is considered a precancerous lesion. DCIS is classified as a 0. It is generally detected through mammograms as calcifications. We discussed the significance of grades and its impact on prognosis. We also discussed the importance of ER and PR receptors and their implications to adjuvant treatment options. Prognosis of DCIS dependence on grade, comedo necrosis. It is anticipated that if not treated, 20-30% of DCIS can develop into invasive breast cancer.  Recommendation: 1. Genetic counseling 2. Breast conserving surgery 3. Followed by adjuvant radiation therapy  Return to clinic after surgery to discuss the final pathology report and come up with an adjuvant treatment plan.

## 2015-12-25 NOTE — Progress Notes (Signed)
Radiation Oncology         [phone] ________________________________  Initial outpatient Consultation  Name: Barbara Fuentes MRN: [id number]  Date: 12/25/2015  DOB: 06-14-1965  CM:[id number], Erline Levine, MD  [name], MD   REFERRING PHYSICIAN: [name], MD  DIAGNOSIS:    ICD-9-CM ICD-10-CM   1. 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 female who presented with right breast calcifications on screening mammography. These spanned over an area of 2 cm. 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 She has not had an axillary ultrasound.   She performs administrative work.  She is otherwise in her USOH. Reports night sweats, cramping, sinus issues, some coughing/SOB, abdominal pain (not severe), sore breast, hot flashes.  Otherwise complete 15 point ROS is negative.  PREVIOUS RADIATION THERAPY: No  PAST MEDICAL HISTORY:  has a past medical history of Alkaline phosphatase deficiency; Allergic state (08/23/2015); Breast cancer of lower-outer quadrant of right female breast ([place]) (12/20/2015); Eustachian tube dysfunction (02/2014); H/O recurrent pneumonia (08/23/2015); History of chicken pox (08/23/2015); Perimenopause (08/23/2015); Preventative health care (08/23/2015); RAD (reactive airway disease); Sacral pain (2014); and Serous otitis media (02/2014).    PAST SURGICAL HISTORY: Past Surgical History:  Procedure Laterality Date  . CERVICAL FUSION  07/30/2010   & Discectomy, Dr Vertell Limber ( @ 3 levels)  . SEPTOPLASTY  1983   for septal deviation  . TONSILLECTOMY    . uterine ablation  2008   Dr Ronita Hipps  . WISDOM TOOTH EXTRACTION      FAMILY HISTORY: family history includes Alcohol abuse in her father, paternal grandfather, and paternal grandmother; Arthritis in her mother; COPD in her  paternal grandmother; Cancer in her maternal grandfather; Colon cancer (age of onset: 9) in her maternal grandfather; Depression in her brother and father; Diabetes in her father; HIV in her brother; Heart attack in her paternal grandfather; Heart disease in her mother and paternal grandfather; Prostate cancer in her father.  SOCIAL HISTORY:  reports that she quit smoking about 5 years ago. She does not have any smokeless tobacco history on file. She reports that she drinks about 1.8 oz of alcohol per week . She reports that she does not use drugs.  ALLERGIES: Codeine and Procaine hcl  MEDICATIONS:  Current Outpatient Prescriptions  Medication Sig Dispense Refill  . cetirizine (ZYRTEC) 10 MG tablet Take 10 mg by mouth daily.    . cyclobenzaprine (FLEXERIL) 5 MG tablet TAKE 1 TABLET BY MOUTH TWICE DAILY AND 1 TO 2 TABS AT BEDTIME ASNEEDED ONLY. 30 tablet 0  . fluticasone (FLONASE) 50 MCG/ACT nasal spray Place 2 sprays into both nostrils daily.     No current facility-administered medications for this encounter.    Gynecologic History  Age at first menstrual period? 14  Are you still having periods? Yes Approximate date of last period? 02/2015?  If you are still having periods: Are your periods regular? No  If you no longer have periods: Have you used hormone replacement? No  If YES, for how long? N/A When did you stop? N/A Obstetric History:  How many children have you carried to term? 2 Your age at first live birth? 89  Pregnant now or trying to get pregnant? No  Have you used birth control pills or hormone shots  for contraception? Yes  If so, for how long (or approximate dates)? Age 62 thru age 50  Would you be interested in learning more about the options to preserve fertility? No Health Maintenance:  Have you ever had a colonoscopy? No If yes, date? N/A  Have you ever had a bone density? No If yes, date? N/A  Date of your last PAP smear? 07/2014 Date of your FIRST mammogram?  33  REVIEW OF SYSTEMS:  Notable for that above.   PHYSICAL EXAM:  Vitals with BMI 12/25/2015  Height 5\' 7"   Weight 147 lbs 8 oz  BMI [id number]  Systolic [id number]  Diastolic 76  Pulse 60  Respirations 18   General: Alert and oriented, in no acute distress HEENT: Head is normocephalic. Extraocular movements are intact. Oropharynx is clear. Neck: Neck is supple, no palpable cervical or supraclavicular lymphadenopathy. Heart: Regular in rate and rhythm with no murmurs, rubs, or gallops. Chest: Clear to auscultation bilaterally, with no rhonchi, wheezes, or rales. Abdomen: Soft, nontender, nondistended, with no rigidity or guarding. Extremities: No cyanosis or edema. Lymphatics: see Neck Exam Skin: No concerning lesions. Musculoskeletal: symmetric strength and muscle tone throughout. Neurologic: Cranial nerves II through XII are grossly intact. No obvious focalities. Speech is fluent. Coordination is intact. Psychiatric: Judgment and insight are intact. Affect is appropriate. Breasts: Right breast lower outer quadrant area of ecchymosis and post biopsy swelling with a post biopsy mass that is about 3 cm in greatest dimension. No other palpable masses appreciated in the breasts or axillae.    ECOG = 0  0 - Asymptomatic (Fully active, able to carry on all predisease activities without restriction)  1 - Symptomatic but completely ambulatory (Restricted in physically strenuous activity but ambulatory and able to carry out work of a light or sedentary nature. For example, light housework, office work)  2 - Symptomatic, <50% in bed during the day (Ambulatory and capable of all self care but unable to carry out any work activities. Up and about more than 50% of waking hours)  3 - Symptomatic, >50% in bed, but not bedbound (Capable of only limited self-care, confined to bed or chair 50% or more of waking hours)  4 - Bedbound (Completely disabled. Cannot carry on any self-care. Totally confined to bed  or chair)  5 - Death   Eustace Pen MM, Creech RH, Tormey DC, et al. [phone]). "Toxicity and response criteria of the [place] [employer]". [place] Oncol. 5 (6): 649-55   LABORATORY DATA:  Lab Results  Component Value Date   WBC 7.7 12/25/2015   HGB 13.2 12/25/2015   HCT 39.4 12/25/2015   MCV 90.8 12/25/2015   PLT 323 12/25/2015   CMP     Component Value Date/Time   NA 139 12/25/2015 0830   K 4.2 12/25/2015 0830   CL 102 10/31/2015 0805   CO2 23 12/25/2015 0830   GLUCOSE 87 12/25/2015 0830   BUN 10.5 12/25/2015 0830   CREATININE 0.7 12/25/2015 0830   CALCIUM 9.5 12/25/2015 0830   PROT 7.1 12/25/2015 0830   ALBUMIN 3.8 12/25/2015 0830   AST 19 12/25/2015 0830   ALT 20 12/25/2015 0830   ALKPHOS 32 (L) 12/25/2015 0830   BILITOT 0.34 12/25/2015 0830   GFRNONAA 90.14 01/08/2010 0814   GFRAA 118 10/13/2007 0000         RADIOGRAPHY: Mm Digital Diagnostic Unilat R  Result Date: 12/19/2015 CLINICAL DATA:  Status post stereotactic guided core biopsy of right breast calcifications.  EXAM: DIAGNOSTIC RIGHT MAMMOGRAM POST STEREOTACTIC BIOPSY COMPARISON:  Previous exam FINDINGS: Mammographic images were obtained following stereotactic guided biopsy of right breast calcifications. A ribbon shaped clip is identified within residual calcifications in the lower outer quadrant of the right breast. IMPRESSION: Tissue marker clip in the expected location following biopsy. Final Assessment: Post Procedure Mammograms for Marker Placement Electronically Signed   By: Nolon Nations M.D.   On: 12/19/2015 13:34   Mm Digital Diagnostic Unilat R  Result Date: 12/18/2015 CLINICAL DATA:  50 year old female for further evaluation of right breast calcifications identified on screening mammogram. EXAM: DIGITAL DIAGNOSTIC RIGHT MAMMOGRAM COMPARISON:  Previous exam(s). ACR Breast Density Category c: The breast tissue is heterogeneously dense, which may obscure small masses. FINDINGS: Spot  magnification views of the right breast demonstrate a 2 cm group of heterogeneous calcifications within the outer right breast. No definite associated mass identified. IMPRESSION: 2 cm group of suspicious calcifications within the outer right breast -tissue sampling recommended. RECOMMENDATION: Stereotactic guided right breast biopsy, which will be scheduled. I have discussed the findings and recommendations with the patient. Results were also provided in writing at the conclusion of the visit. If applicable, a reminder letter will be sent to the patient regarding the next appointment. BI-RADS CATEGORY  4: Suspicious. Electronically Signed   By: Margarette Canada M.D.   On: 12/18/2015 14:19   Mm Rt Breast Bx W Loc Dev 1st Lesion Image Bx Spec Stereo Guide  Addendum Date: 12/20/2015   ADDENDUM REPORT: 12/20/2015 12:44 ADDENDUM: Pathology revealed HIGH GRADE DUCTAL CARCINOMA IN SITU WITH CALCIFICATIONS, STROMAL CALCIFICATIONS, COMPLEX SCLEROSING LESION of the lower outer quadrant of the Right breast. This was found to be concordant by Dr. Nolon Nations. Pathology results were discussed with the patient by telephone. The patient reported doing well after the biopsy with tenderness at the site. Post biopsy instructions and care were reviewed and questions were answered. The patient was encouraged to call [facility] for any additional concerns. The patient was referred to [facility] on December 25, 2015. Pathology results reported by Terie Purser, RN on 12/20/2015. Electronically Signed   By: Nolon Nations M.D.   On: 12/20/2015 12:44   Result Date: 12/20/2015 CLINICAL DATA:  Patient presents for stereotactic guided core biopsy of right breast calcifications. EXAM: RIGHT BREAST STEREOTACTIC CORE NEEDLE BIOPSY COMPARISON:  Previous exams. FINDINGS: The patient and I discussed the procedure of stereotactic-guided  biopsy including benefits and alternatives. We discussed the high likelihood of a successful procedure. We discussed the risks of the procedure including infection, bleeding, tissue injury, clip migration, and inadequate sampling. Informed written consent was given. The usual time out protocol was performed immediately prior to the procedure. Using sterile technique and 1% Lidocaine as local anesthetic, under stereotactic guidance, a 9 gauge vacuum assisted device was used to perform core needle biopsy of calcifications in the lower outer quadrant of the right breast using a lateral approach. Specimen radiograph was performed showing calcifications to be present. Specimens with calcifications are identified for pathology. At the conclusion of the procedure, a ribbon shaped tissue marker clip was deployed into the biopsy cavity. Follow-up 2-view mammogram was performed and dictated separately. IMPRESSION: Stereotactic-guided biopsy of right breast calcifications. No apparent complications. Electronically Signed: By: Nolon Nations M.D. On: 12/19/2015 13:34      IMPRESSION/PLAN: DCIS right breast  She has been discussed at our multidisciplinary tumor board.  The  consensus is that she would  be a good candidate for breast conservation. I talked to her about the option of a mastectomy and informed her that her expected overall survival would be equivalent between mastectomy and breast conservation, based upon randomized controlled data. She is enthusiastic about breast conservation.  According to tumor board discussion an MRI will be ordered to evaluate the breast further. Genetics referral will be made. Anticipate lumpectomy followed by adjuvant radiotherapy.   It was a pleasure meeting the patient today. We discussed the risks, benefits, and side effects of radiotherapy. I recommend radiotherapy to the right breast to reduce her risk of locoregional recurrence by 1/2.  We discussed that radiation would take  approximately 4 weeks to complete and that I would give the patient a few weeks to heal following surgery before starting treatment planning.  We spoke about acute effects including skin irritation and fatigue as well as much less common late effects including internal organ injury or irritation. We spoke about the latest technology that is used to minimize the risk of late effects for patients undergoing radiotherapy to the breast or chest wall. No guarantees of treatment were given. The patient is enthusiastic about proceeding with treatment. I look forward to participating in the patient's care.  __________________________________________   Eppie Gibson, MD    This document serves as a record of services personally performed by Eppie Gibson, MD. It was created on her behalf by Arlyce Harman, a trained medical scribe. The creation of this record is based on the scribe's personal observations and the provider's statements to them. This document has been checked and approved by the attending provider.

## 2015-12-26 ENCOUNTER — Ambulatory Visit (HOSPITAL_BASED_OUTPATIENT_CLINIC_OR_DEPARTMENT_OTHER): Payer: BLUE CROSS/BLUE SHIELD | Admitting: Genetic Counselor

## 2015-12-26 ENCOUNTER — Other Ambulatory Visit: Payer: BLUE CROSS/BLUE SHIELD

## 2015-12-26 DIAGNOSIS — Z800 Family history of malignant neoplasm of digestive organs: Secondary | ICD-10-CM | POA: Diagnosis not present

## 2015-12-26 DIAGNOSIS — Z8042 Family history of malignant neoplasm of prostate: Secondary | ICD-10-CM

## 2015-12-26 DIAGNOSIS — C50511 Malignant neoplasm of lower-outer quadrant of right female breast: Secondary | ICD-10-CM

## 2015-12-26 DIAGNOSIS — Z315 Encounter for genetic counseling: Secondary | ICD-10-CM | POA: Diagnosis not present

## 2015-12-26 DIAGNOSIS — D0511 Intraductal carcinoma in situ of right breast: Secondary | ICD-10-CM

## 2015-12-27 ENCOUNTER — Ambulatory Visit
Admission: RE | Admit: 2015-12-27 | Discharge: 2015-12-27 | Disposition: A | Discharge status: Home / Self Care | Payer: BLUE CROSS/BLUE SHIELD | Source: Ambulatory Visit | Attending: Surgery | Admitting: Surgery

## 2015-12-27 ENCOUNTER — Other Ambulatory Visit: Payer: Self-pay | Admitting: Surgery

## 2015-12-27 ENCOUNTER — Encounter: Payer: Self-pay | Admitting: Genetic Counselor

## 2015-12-27 DIAGNOSIS — C50511 Malignant neoplasm of lower-outer quadrant of right female breast: Secondary | ICD-10-CM

## 2015-12-27 DIAGNOSIS — D0591 Unspecified type of carcinoma in situ of right breast: Secondary | ICD-10-CM

## 2015-12-27 DIAGNOSIS — Z800 Family history of malignant neoplasm of digestive organs: Secondary | ICD-10-CM | POA: Insufficient documentation

## 2015-12-27 MED ORDER — GADOBENATE DIMEGLUMINE 529 MG/ML IV SOLN
13.0000 mL | Freq: Once | INTRAVENOUS | Status: AC | PRN
  Administered 2015-12-27: 13 mL via INTRAVENOUS

## 2015-12-27 NOTE — Progress Notes (Signed)
REFERRING PROVIDER: Nicholas Lose, MD  PRIMARY PROVIDER:  [name], MD  PRIMARY REASON FOR VISIT:  1. Breast cancer of lower-outer quadrant of right female breast ([place])   2. Family history of colon cancer   3. Family history of prostate cancer in father      HISTORY OF PRESENT ILLNESS:   Barbara Fuentes, a 50 y.o. female, was seen for a [facility] cancer genetics consultation at the request of Dr. Lindi Adie due to a personal history of ER/PR- DCIS at age 41 and family history of cancer.  Barbara Fuentes presents to clinic today to discuss the possibility of a hereditary predisposition to cancer, genetic testing, and to further clarify her future cancer risks, as well as potential cancer risks for family members.   In August 2017, at the age of 75, Barbara Fuentes was diagnosed with DCIS of the right breast.  Hormone receptor status was ER/PR-.  Genetic testing will help inform surgical and treatment decisions.   CANCER HISTORY:    Breast cancer of lower-outer quadrant of right female breast ([place])   12/18/2015 Mammogram    Right breast: 2 cm group of suspicious calcifications within the outer right breast      12/19/2015 Initial Diagnosis    Right breast DCIS with calcifications, complex sclerosing lesion, ER 0%, PR 0%, Tis N0 stage 0        HORMONAL RISK FACTORS:  Menarche was at age 38-15.  First live birth at age 19.  OCP use for approximately 20 years.  Ovaries intact: yes.  Hysterectomy: no.  Menopausal status: perimenopausal.  HRT use: 0 years. Colonoscopy: no; not examined. Mammogram within the last year: yes. Number of breast biopsies: 1. Up to date with pelvic exams:  yes. Any excessive radiation exposure in the past:  no  Past Medical History:  Diagnosis Date  . Alkaline phosphatase deficiency    28 in 9/11  . Allergic state 08/23/2015  . Breast cancer of lower-outer quadrant of right female breast ([place]) 12/20/2015  . Eustachian tube dysfunction 02/2014  . H/O  recurrent pneumonia 08/23/2015  . History of chicken pox 08/23/2015  . Perimenopause 08/23/2015  . Preventative health care 08/23/2015  . RAD (reactive airway disease)     FORMER smoker , RAD with RTI's, cat or allergen exposure  . Sacral pain 2014   trauma ( fall)  . Serous otitis media 02/2014    Past Surgical History:  Procedure Laterality Date  . CERVICAL FUSION  07/30/2010   & Discectomy, Dr Vertell Limber ( @ 3 levels)  . SEPTOPLASTY  1983   for septal deviation  . TONSILLECTOMY    . uterine ablation  2008   Dr Ronita Hipps  . WISDOM TOOTH EXTRACTION      Social History   Social History  . Marital status: Married    Spouse name: N/A  . Number of children: N/A  . Years of education: N/A   Occupational History  . Sales Associate     Social History Main Topics  . Smoking status: Former Smoker    Years: 28.00    Types: Cigarettes    Quit date: 06/18/2010  . Smokeless tobacco: Never Used     Comment: smoked age [age] up to 1 ppd  . Alcohol use 1.8 oz/week    3 Glasses of wine per week     Comment: socially  . Drug use: No  . Sexual activity: Not Asked   Other Topics Concern  . None   Social  History Narrative  . None     FAMILY HISTORY:  We obtained a detailed, 4-generation family history.  Significant diagnoses are listed below: Family History  Problem Relation Age of Onset  . Diabetes Father     Type II, AO  . Alcohol abuse Father   . Depression Father   . Prostate cancer Father 49  . Depression Brother   . HIV Brother     AIDS  . Heart attack Paternal Grandfather 7  . Heart disease Paternal Grandfather   . Alcohol abuse Paternal Grandfather     smoker  . Colon cancer Maternal Grandfather     dx late 25s; w/ colostomy  . COPD Paternal Grandmother   . Alcohol abuse Paternal Grandmother     smoker  . Emphysema Paternal Grandmother   . Heart disease Mother     cardiac rest, epinephrine with Novocaine  . Arthritis Mother   . Other Maternal Grandmother      issues w/ breast in her 90s--may have been breast cancer  . Stroke Neg Hx     Barbara Fuentes has one son who is 76 and one daughter, age 17.  She had one full brother who died of AIDS at 32.  He had no children.  Barbara Fuentes mother is currently 18 and has not had cancer.  Barbara Fuentes father is currently 55.  He was diagnosed with prostate cancer at age 53; Barbara Fuentes is not sure what his Gleason score was.    Barbara Fuentes mother has two full brothers.  They are currently 28 and 60 and have not had cancer.  Barbara Fuentes has some limited information for her two maternal first cousins.  Her maternal grandmother passed away at 22.  She reports that she had issues with her "nipple oozing" in her 90s that was not treated due to her age.  Barbara Fuentes maternal grandfather was diagnosed with colon cancer in his late 37s; Barbara Fuentes recalls him having a colostomy bag.  He passed away at 40.    Barbara Fuentes father has two full sisters, ages 18 and 55.  Neither of his sisters have had cancer.  Only one sister has a child--one son who is also cancer-free.  Barbara Fuentes paternal grandmother was a smoker and heavy drinker.  She died at age 9 related to COPD and emphysema.  She had 2-3 siblings who passed away at somewhat younger ages due to heavy alcohol use.  Barbara Fuentes paternal grandfather (also a smoker) died of heart disease and heart attack at 59.  Barbara Fuentes is unaware of any additional family history of cancer.  Barbara Fuentes reports no known family history of genetic testing for hereditary cancer.  Patient's maternal ancestors are of [place], [place], [place], and [place] descent, and paternal ancestors are of [place], [place], [place], [place], and Native American descent. Barbara Fuentes took [place] DNA test and that reported a very small amount of Ashkenazi Jewish ancestry. There is no known consanguinity.  GENETIC COUNSELING ASSESSMENT: Barbara Fuentes is a 50 y.o. female with a personal history which  is somewhat suggestive of a hereditary breast cancer syndrome and predisposition to cancer. We, therefore, discussed and recommended the following at today's visit.   DISCUSSION: We reviewed the characteristics, features and inheritance patterns of hereditary cancer syndromes, particularly those caused by mutations within the BRCA1/2 genes. We also discussed genetic testing, including the appropriate family members to test, the process of testing, insurance coverage and turn-around-time for results. We discussed  the implications of a negative, positive and/or variant of uncertain significant result. We recommended Barbara Fuentes. Whipple pursue genetic testing for the BRCA1/2 Ashkenazi Founder Panel with reflex to the 20-gene Breast/Ovarian Cancer Panel through [facility].  The BRCA1/2 Ashkenazi Founder Panel performed by [facility] Hope Pigeon, MD) includes analysis for the three common Ashkenazi Jewish pathogenic BRCA mutations: "c.68_69delAG" and "c.5266dupC" on the BRCA1 gene and "c.5946delT" on the BRCA2 gene.  The Breast/Ovarian Cancer Panel offered by [facility] Hope Pigeon, MD) includes sequencing and deletion/duplication analysis for the following 19 genes:  ATM, BARD1, BRCA1, BRCA2, BRIP1, CDH1, CHEK2, FANCC, MLH1, MSH2, MSH6, NBN, PALB2, PMS2, PTEN, RAD51C, RAD51D, TP53, and XRCC2.  This panel also includes deletion/duplication analysis (without sequencing) for one gene, EPCAM.  Based on Barbara Fuentes. Vialpando's personal history of cancer, she meets medical criteria for genetic testing. Despite that she meets criteria, she may still have an out of pocket cost. We discussed that if her out of pocket cost for testing is over $100, the laboratory will call and confirm whether she wants to proceed with testing.  If the out of pocket cost of testing is less than $100 she will be billed by the genetic testing laboratory.   PLAN: After considering the risks, benefits, and limitations, Barbara Fuentes. Deveny   provided informed consent to pursue genetic testing and the blood sample was sent to [facility] for analysis of the BRCA1/2 Ashkenazi Founder Panel with reflex to the 20-gene Breast/Ovarian Cancer Panel. Results should be available within approximately 2 weeks' time, at which point they will be disclosed by telephone to Barbara Fuentes. Criado, as will any additional recommendations warranted by these results. Barbara Fuentes. Rayos will receive a summary of her genetic counseling visit and a copy of her results once available. This information will also be available in Epic. We encouraged Barbara Fuentes. Skillman to remain in contact with cancer genetics annually so that we can continuously update the family history and inform her of any changes in cancer genetics and testing that may be of benefit for her family. Barbara Fuentes. Novella questions were answered to her satisfaction today. Our contact information was provided should additional questions or concerns arise.  Thank you for the referral and allowing [place] to share in the care of your patient.   Jeanine Luz, Barbara Fuentes, [facility] Certified Genetic Counselor [email or web address] Phone: [phone]  The patient was seen for a total of 60 minutes in face-to-face genetic counseling.  This patient was discussed with Drs. Magrinat, Lindi Adie and/or Burr Medico who agrees with the above.    _______________________________________________________________________ For Office Staff:  Number of people involved in session: 1 Was an Intern/ student involved with case: no

## 2016-01-01 ENCOUNTER — Other Ambulatory Visit: Payer: Self-pay | Admitting: Surgery

## 2016-01-01 DIAGNOSIS — D0591 Unspecified type of carcinoma in situ of right breast: Secondary | ICD-10-CM

## 2016-01-03 ENCOUNTER — Encounter (HOSPITAL_BASED_OUTPATIENT_CLINIC_OR_DEPARTMENT_OTHER): Payer: Self-pay | Admitting: *Deleted

## 2016-01-03 ENCOUNTER — Telehealth: Payer: Self-pay | Admitting: *Deleted

## 2016-01-03 NOTE — Telephone Encounter (Signed)
Left vm regarding bmdc from 12/25/15. Contact information provided.

## 2016-01-06 ENCOUNTER — Telehealth: Payer: Self-pay | Admitting: Genetic Counselor

## 2016-01-06 NOTE — Telephone Encounter (Signed)
Discussed with Ms. Barbara Fuentes that her genetic test result were negative for known pathogenic mutations within any of 20 genes on the Breast/Ovarian Cancer Panel through [facility].  Additionally, no uncertain changes were found.  Discussed that this is most likely a reassuring test result and tells [place] that her breast cancer is most likely to be sporadic.  Encouraged her to continue to follow her doctors' recommendations for future cancer screening.  Ms. Barbara Fuentes daughter should make her future primary doctor aware of her family history of breast cancer (both maternal and paternal), so that she can receive the most appropriate cancer screening in the future.  Ms. Barbara Fuentes is welcome to call with any questions.  I will email her a copy of her test result.

## 2016-01-08 ENCOUNTER — Ambulatory Visit
Admission: RE | Admit: 2016-01-08 | Discharge: 2016-01-08 | Disposition: A | Discharge status: Home / Self Care | Payer: BLUE CROSS/BLUE SHIELD | Source: Ambulatory Visit | Attending: Surgery | Admitting: Surgery

## 2016-01-08 DIAGNOSIS — D0591 Unspecified type of carcinoma in situ of right breast: Secondary | ICD-10-CM

## 2016-01-08 NOTE — Progress Notes (Signed)
Boost drink given with instructions to complete by 0700am. Pt verbalized understanding.

## 2016-01-09 ENCOUNTER — Telehealth: Payer: Self-pay | Admitting: *Deleted

## 2016-01-09 NOTE — H&P (Signed)
Sheldon Silvan Location: [facility] Patient #: [id number] DOB: 12/03/1965 Undefined / Language: Cleophus Molt / Race: White Female  History of Present Illness   The patient is a 50 year old female who presents with a complaint of breast cancer.   The PCP is Dr. Frederik Pear (she was a patient of Dr. Linna Darner)  The patient was referred by Dr. Domenick Bookbinder.  She is at the [facility] clinic with Drs. Philis Nettle.  She comes with her friend, Wilmon Pali. She is a cardiac nurse.  She gets annual mammograms. She has had some soreness in her right breast, but noticed nothing else unusual. Ms. Dennehy underwent a mammogram at [facility] on 12/18/2015 which showed right breast microca++ in the LOQ. The microca++ are about 2.0 cm. 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 She is not on hormone tx. She has had an endometrial ablation. She has no fam hx of breast cancer.  I discussed the options for breast cancer treatment with the patient. She is in the [facility] [place], which includes medical oncology and radiation oncology. I discussed the surgical options of lumpectomy vs. mastectomy. If mastectomy, there is the possibility of reconstruction. I discussed the options of lymph node biopsy. At this time, she does not need a lymph node biopsy. The treatment plan depends on the pathologic staging of the tumor and the patient's personal wishes. The risks of surgery include, but are not limited to, bleeding, infection, the need for further surgery, and nerve injury. The patient has been given literature on the treatment of breast cancer. She asked appropriate questions comparing a mastectomy to lumpectomy.  Plan - 1) Genetics (because her tumor is ER neg), 2) MRI because of breast density, 3) She is a candidate for lumpectomy alone (no SLNBx), 4) rad tx, 5) antihormal tx, maybe  Past  Medical History: 1. Cervical fusion - 07/30/2010 - Dr. Vertell Limber 2. Quit smoking in 2012 3. Questions about her LFT that Huey Bienenstock was following - but the labs today look good. 4. Has not had a colonoscopy  Social History:  Married. Her husband works with [employer] and he is in [place] at this time. He left the day of her biopsy. She has two children: [name] 47 yo son, [name] 30 yo daughter, and 4 step children. 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   She comes with her friend, Wilmon Pali. Susa Raring told me that she read bad things about our practice on [employer]. She complained about Jeannie who interviewed her.    Other Problems Conni Slipper, RN; 12/25/2015 8:12 AM) Arthritis Bladder Problems Hemorrhoids  Past Surgical History Conni Slipper, RN; 12/25/2015 8:12 AM) Breast Biopsy Right.  Diagnostic Studies History Conni Slipper, RN; 12/25/2015 8:12 AM) Colonoscopy never Mammogram within last year Pap Smear 1-5 years ago  Social History Conni Slipper, RN; 12/25/2015 8:12 AM) Alcohol use Moderate alcohol use. Caffeine use Carbonated beverages, Coffee, Tea. Illicit drug use Remotely quit drug use. Tobacco use Former smoker.  Family History Conni Slipper, RN; 12/25/2015 8:12 AM) Alcohol Abuse Father. Anesthetic complications Mother. Colon Cancer Family Members In General. Depression Brother, Father. Heart Disease Family Members In General. Prostate Cancer Father.  Pregnancy / Birth History Conni Slipper, RN; 12/25/2015 8:12 AM) Age at menarche 25 years. Contraceptive History Oral contraceptives. Gravida 3 Maternal age 45-20 Para 2 Regular periods    Review of Systems (  Conni Slipper RN; 12/25/2015 8:12 AM) General Present- Night Sweats. Not Present- Appetite Loss, Chills, Fatigue, Fever, Weight Gain and Weight Loss. Skin Not Present- Change in Wart/Mole, Dryness, Hives, Jaundice, New Lesions, Non-Healing Wounds, Rash and  Ulcer. HEENT Present- Ringing in the Ears, Seasonal Allergies and Wears glasses/contact lenses. Not Present- Earache, Hearing Loss, Hoarseness, Nose Bleed, Oral Ulcers, Sinus Pain, Sore Throat, Visual Disturbances and Yellow Eyes. Respiratory Present- Snoring. Not Present- Bloody sputum, Chronic Cough, Difficulty Breathing and Wheezing. Breast Present- Breast Pain. Not Present- Breast Mass, Nipple Discharge and Skin Changes. Cardiovascular Not Present- Chest Pain, Difficulty Breathing Lying Down, Leg Cramps, Palpitations, Rapid Heart Rate, Shortness of Breath and Swelling of Extremities. Gastrointestinal Present- Change in Bowel Habits, Constipation and Hemorrhoids. Not Present- Abdominal Pain, Bloating, Bloody Stool, Chronic diarrhea, Difficulty Swallowing, Excessive gas, Gets full quickly at meals, Indigestion, Nausea, Rectal Pain and Vomiting. Female Genitourinary Present- Frequency and Nocturia. Not Present- Painful Urination, Pelvic Pain and Urgency. Musculoskeletal Not Present- Back Pain, Joint Pain, Joint Stiffness, Muscle Pain, Muscle Weakness and Swelling of Extremities. Neurological Present- Decreased Memory. Not Present- Fainting, Headaches, Numbness, Seizures, Tingling, Tremor, Trouble walking and Weakness. Psychiatric Not Present- Anxiety, Bipolar, Change in Sleep Pattern, Depression, Fearful and Frequent crying. Endocrine Present- Hot flashes. Not Present- Cold Intolerance, Excessive Hunger, Hair Changes, Heat Intolerance and New Diabetes. Hematology Not Present- Blood Thinners, Easy Bruising, Excessive bleeding, Gland problems, HIV and Persistent Infections.   Physical Exam  General: WN WFalert and generally healthy appearing. Skin: Inspection and palpation of the skin unremarkable.  Eyes: Conjunctivae white, pupils equal. Face, ears, nose, mouth, and throat: Face - normal. Normal ears and nose. Lips and teeth normal.  Neck: Supple. No mass. Trachea midline. No thyroid mass.   She has a scar from a left anterior cervical approach. Lymph Nodes: No supraclavicular, cervical, or axillary adenopathy.  Lungs: Normal respiratory effort. Clear to auscultation and symmetric breath sounds. Cardiovascular: Regular rate and rythm. Normal auscultation of the heart. No murmur or rub. Normal carotid pulse.  Breasts: Right - 2.0 cm mass at the 8 o'clock position - about 5 cm from areola. This is probably a hematoma from biopsy. Left - No mass or nodule.  Abdomen: Soft. No mass. Liver and spleen not palpable. No tenderness. No hernia. Normal bowel sounds. No abdominal scars. Rectal: Not done.  Musculoskeletal/extremities: Normal gait. Good strength and ROM in upper and lower extremities.   Neurologic: Grossly intact to motor and sensory function.  No obvious deficit in the cranial nerves.  Psychiatric: Has normal mood and affect. Judgement and insight appear normal.  Assessment & Plan  1.  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   [place] - Drs. Philis Nettle  Plan:   1) Genetics (because her tumor is ER neg),    [Addendum Note(Shaddai Shapley H. Lucia Gaskins MD; 01/06/2016 5:53 PM)    From Jeanine Luz: Genetic testing negative.]    2) MRI because of breast density (I will call her after the MRI results)    [Addendum Note(Kayler Buckholtz H. Lucia Gaskins MD; 12/27/2015 1:38 PM)    MRI of breast - 12/27/2015 1. Area of abnormal, non mass, enhancement adjacent to the biopsy cavity covers an area of 3.1 x 2.5 x 2.6 cm in the lower outer quadrant of the right breast.     2. No other areas of abnormal enhancement to suggest other areas of malignancy.  3. No abnormal lymph nodes or other abnormality to suggest metastatic disease.]    3) She is a candidate for lumpectomy alone (no SLNBx),   4) rad tx,   5) antihormal tx, maybe  2. Cervical  fusion - 07/30/2010 - Dr. Vertell Limber 3 Quit smoking in 2012 4. Questions about her LFT that Huey Bienenstock was following - but the labs today look good.    [name], MD, [facility] Pager: [phone] Office phone:  [phone]

## 2016-01-09 NOTE — Telephone Encounter (Signed)
  Oncology Nurse Navigator Documentation    Navigator Encounter Type: Telephone (01/09/16 1400) Telephone: Lahoma Crocker Call;Appt Confirmation/Clarification;FMLA/Disability (01/09/16 1400)     Surgery Date: 01/10/16 (01/09/16 1400)                                  Time Spent with Patient: 15 (01/09/16 1400)

## 2016-01-10 ENCOUNTER — Encounter (HOSPITAL_BASED_OUTPATIENT_CLINIC_OR_DEPARTMENT_OTHER)
Admission: RE | Disposition: A | Discharge status: Home / Self Care | Payer: Self-pay | Source: Ambulatory Visit | Attending: Surgery

## 2016-01-10 ENCOUNTER — Ambulatory Visit (HOSPITAL_BASED_OUTPATIENT_CLINIC_OR_DEPARTMENT_OTHER): Payer: BLUE CROSS/BLUE SHIELD | Admitting: Anesthesiology

## 2016-01-10 ENCOUNTER — Ambulatory Visit (HOSPITAL_BASED_OUTPATIENT_CLINIC_OR_DEPARTMENT_OTHER)
Admission: RE | Admit: 2016-01-10 | Discharge: 2016-01-10 | Disposition: A | Discharge status: Home / Self Care | Payer: BLUE CROSS/BLUE SHIELD | Source: Ambulatory Visit | Attending: Surgery | Admitting: Surgery

## 2016-01-10 ENCOUNTER — Encounter (HOSPITAL_BASED_OUTPATIENT_CLINIC_OR_DEPARTMENT_OTHER): Payer: Self-pay | Admitting: Anesthesiology

## 2016-01-10 ENCOUNTER — Encounter: Payer: Self-pay | Admitting: Surgery

## 2016-01-10 ENCOUNTER — Ambulatory Visit
Admission: RE | Admit: 2016-01-10 | Discharge: 2016-01-10 | Disposition: A | Discharge status: Home / Self Care | Payer: BLUE CROSS/BLUE SHIELD | Source: Ambulatory Visit | Attending: Surgery | Admitting: Surgery

## 2016-01-10 DIAGNOSIS — D0591 Unspecified type of carcinoma in situ of right breast: Secondary | ICD-10-CM

## 2016-01-10 DIAGNOSIS — Z87891 Personal history of nicotine dependence: Secondary | ICD-10-CM | POA: Diagnosis not present

## 2016-01-10 DIAGNOSIS — C50511 Malignant neoplasm of lower-outer quadrant of right female breast: Secondary | ICD-10-CM | POA: Insufficient documentation

## 2016-01-10 DIAGNOSIS — Z171 Estrogen receptor negative status [ER-]: Secondary | ICD-10-CM | POA: Insufficient documentation

## 2016-01-10 HISTORY — PX: BREAST LUMPECTOMY WITH RADIOACTIVE SEED LOCALIZATION: SHX6424

## 2016-01-10 HISTORY — DX: Hypotension, unspecified: I95.9

## 2016-01-10 SURGERY — BREAST LUMPECTOMY WITH RADIOACTIVE SEED LOCALIZATION
Anesthesia: General | Site: Breast | Laterality: Right

## 2016-01-10 MED ORDER — ONDANSETRON HCL 4 MG/2ML IJ SOLN
INTRAMUSCULAR | Status: DC | PRN
  Administered 2016-01-10: 4 mg via INTRAVENOUS

## 2016-01-10 MED ORDER — MIDAZOLAM HCL 2 MG/2ML IJ SOLN: 1.0000 mg | INTRAMUSCULAR | Status: DC | PRN

## 2016-01-10 MED ORDER — PROMETHAZINE HCL 25 MG/ML IJ SOLN: 6.2500 mg | INTRAMUSCULAR | Status: DC | PRN

## 2016-01-10 MED ORDER — ONDANSETRON HCL 4 MG/2ML IJ SOLN
INTRAMUSCULAR | Status: AC
  Filled 2016-01-10: qty 2

## 2016-01-10 MED ORDER — LIDOCAINE 2% (20 MG/ML) 5 ML SYRINGE
INTRAMUSCULAR | Status: AC
  Filled 2016-01-10: qty 5

## 2016-01-10 MED ORDER — PROPOFOL 10 MG/ML IV BOLUS
INTRAVENOUS | Status: AC
  Filled 2016-01-10: qty 20

## 2016-01-10 MED ORDER — FENTANYL CITRATE (PF) 100 MCG/2ML IJ SOLN
INTRAMUSCULAR | Status: DC | PRN
  Administered 2016-01-10: 100 ug via INTRAVENOUS

## 2016-01-10 MED ORDER — GABAPENTIN 300 MG PO CAPS
ORAL_CAPSULE | ORAL | Status: AC
  Filled 2016-01-10: qty 1

## 2016-01-10 MED ORDER — CEFAZOLIN SODIUM-DEXTROSE 2-4 GM/100ML-% IV SOLN
2.0000 g | INTRAVENOUS | Status: AC
  Administered 2016-01-10: 2 g via INTRAVENOUS

## 2016-01-10 MED ORDER — HYDROCODONE-ACETAMINOPHEN 5-325 MG PO TABS: 1.0000 | ORAL_TABLET | Freq: Four times a day (QID) | ORAL | 0 refills | Status: DC | PRN

## 2016-01-10 MED ORDER — CEFAZOLIN SODIUM-DEXTROSE 2-4 GM/100ML-% IV SOLN
INTRAVENOUS | Status: AC
  Filled 2016-01-10: qty 100

## 2016-01-10 MED ORDER — DEXAMETHASONE SODIUM PHOSPHATE 10 MG/ML IJ SOLN
INTRAMUSCULAR | Status: AC
  Filled 2016-01-10: qty 1

## 2016-01-10 MED ORDER — ACETAMINOPHEN 500 MG PO TABS
ORAL_TABLET | ORAL | Status: AC
  Filled 2016-01-10: qty 2

## 2016-01-10 MED ORDER — GABAPENTIN 300 MG PO CAPS: 300.0000 mg | ORAL_CAPSULE | ORAL | Status: DC

## 2016-01-10 MED ORDER — LIDOCAINE HCL (CARDIAC) 20 MG/ML IV SOLN
INTRAVENOUS | Status: DC | PRN
  Administered 2016-01-10: 30 mg via INTRAVENOUS

## 2016-01-10 MED ORDER — SCOPOLAMINE 1 MG/3DAYS TD PT72: 1.0000 | MEDICATED_PATCH | Freq: Once | TRANSDERMAL | Status: DC | PRN

## 2016-01-10 MED ORDER — LACTATED RINGERS IV SOLN
INTRAVENOUS | Status: DC
  Administered 2016-01-10 (×2): via INTRAVENOUS

## 2016-01-10 MED ORDER — PROPOFOL 10 MG/ML IV BOLUS
INTRAVENOUS | Status: DC | PRN
Start: 2016-01-10 — End: 2016-01-10
  Administered 2016-01-10: 100 mg via INTRAVENOUS
  Administered 2016-01-10: 200 mg via INTRAVENOUS

## 2016-01-10 MED ORDER — GLYCOPYRROLATE 0.2 MG/ML IJ SOLN: 0.2000 mg | Freq: Once | INTRAMUSCULAR | Status: DC | PRN

## 2016-01-10 MED ORDER — CHLORHEXIDINE GLUCONATE CLOTH 2 % EX PADS: 6.0000 | MEDICATED_PAD | Freq: Once | CUTANEOUS | Status: DC

## 2016-01-10 MED ORDER — DEXAMETHASONE SODIUM PHOSPHATE 4 MG/ML IJ SOLN
INTRAMUSCULAR | Status: DC | PRN
  Administered 2016-01-10: 10 mg via INTRAVENOUS

## 2016-01-10 MED ORDER — FENTANYL CITRATE (PF) 100 MCG/2ML IJ SOLN
INTRAMUSCULAR | Status: AC
  Filled 2016-01-10: qty 2

## 2016-01-10 MED ORDER — MIDAZOLAM HCL 2 MG/2ML IJ SOLN
INTRAMUSCULAR | Status: AC
  Filled 2016-01-10: qty 2

## 2016-01-10 MED ORDER — FENTANYL CITRATE (PF) 100 MCG/2ML IJ SOLN: 25.0000 ug | INTRAMUSCULAR | Status: DC | PRN

## 2016-01-10 MED ORDER — FENTANYL CITRATE (PF) 100 MCG/2ML IJ SOLN: 50.0000 ug | INTRAMUSCULAR | Status: DC | PRN

## 2016-01-10 MED ORDER — EPHEDRINE 5 MG/ML INJ
INTRAVENOUS | Status: AC
  Filled 2016-01-10: qty 10

## 2016-01-10 MED ORDER — ACETAMINOPHEN 500 MG PO TABS
1000.0000 mg | ORAL_TABLET | ORAL | Status: AC
  Administered 2016-01-10: 1000 mg via ORAL

## 2016-01-10 MED ORDER — MIDAZOLAM HCL 5 MG/5ML IJ SOLN
INTRAMUSCULAR | Status: DC | PRN
  Administered 2016-01-10: 2 mg via INTRAVENOUS

## 2016-01-10 MED ORDER — BUPIVACAINE-EPINEPHRINE 0.25% -1:200000 IJ SOLN
INTRAMUSCULAR | Status: DC | PRN
Start: 2016-01-10 — End: 2016-01-10
  Administered 2016-01-10: 30 mL

## 2016-01-10 SURGICAL SUPPLY — 52 items
APL SKNCLS STERI-STRIP NONHPOA (GAUZE/BANDAGES/DRESSINGS)
BENZOIN TINCTURE PRP APPL 2/3 (GAUZE/BANDAGES/DRESSINGS) IMPLANT
BINDER BREAST LRG (GAUZE/BANDAGES/DRESSINGS) ×2 IMPLANT
BINDER BREAST MEDIUM (GAUZE/BANDAGES/DRESSINGS) IMPLANT
BINDER BREAST XLRG (GAUZE/BANDAGES/DRESSINGS) IMPLANT
BINDER BREAST XXLRG (GAUZE/BANDAGES/DRESSINGS) IMPLANT
BLADE HEX COATED 2.75 (ELECTRODE) IMPLANT
BLADE SURG 10 STRL SS (BLADE) ×2 IMPLANT
BLADE SURG 15 STRL LF DISP TIS (BLADE) IMPLANT
BLADE SURG 15 STRL SS (BLADE)
CANISTER SUC SOCK COL 7IN (MISCELLANEOUS) IMPLANT
CANISTER SUCT 1200ML W/VALVE (MISCELLANEOUS) ×2 IMPLANT
CHLORAPREP W/TINT 26ML (MISCELLANEOUS) ×2 IMPLANT
CLIP TI WIDE RED SMALL 6 (CLIP) ×2 IMPLANT
COVER BACK TABLE 60X90IN (DRAPES) ×2 IMPLANT
COVER MAYO STAND STRL (DRAPES) ×2 IMPLANT
COVER PROBE W GEL 5X96 (DRAPES) ×2 IMPLANT
DECANTER SPIKE VIAL GLASS SM (MISCELLANEOUS) IMPLANT
DEVICE DUBIN W/COMP PLATE 8390 (MISCELLANEOUS) ×2 IMPLANT
DRAPE LAPAROTOMY 100X72 PEDS (DRAPES) ×2 IMPLANT
DRAPE UTILITY XL STRL (DRAPES) ×2 IMPLANT
DRSG PAD ABDOMINAL 8X10 ST (GAUZE/BANDAGES/DRESSINGS) IMPLANT
ELECT COATED BLADE 2.86 ST (ELECTRODE) ×2 IMPLANT
ELECT REM PT RETURN 9FT ADLT (ELECTROSURGICAL) ×2
ELECTRODE REM PT RTRN 9FT ADLT (ELECTROSURGICAL) ×1 IMPLANT
GLOVE BIOGEL PI IND STRL 7.0 (GLOVE) ×1 IMPLANT
GLOVE BIOGEL PI INDICATOR 7.0 (GLOVE) ×1
GLOVE ECLIPSE 6.5 STRL STRAW (GLOVE) ×2 IMPLANT
GLOVE EXAM NITRILE EXT CUFF MD (GLOVE) ×2 IMPLANT
GLOVE SURG SIGNA 7.5 PF LTX (GLOVE) ×4 IMPLANT
GOWN STRL REUS W/ TWL LRG LVL3 (GOWN DISPOSABLE) ×1 IMPLANT
GOWN STRL REUS W/ TWL XL LVL3 (GOWN DISPOSABLE) ×1 IMPLANT
GOWN STRL REUS W/TWL LRG LVL3 (GOWN DISPOSABLE) ×2
GOWN STRL REUS W/TWL XL LVL3 (GOWN DISPOSABLE) ×2
KIT MARKER MARGIN INK (KITS) ×2 IMPLANT
LIQUID BAND (GAUZE/BANDAGES/DRESSINGS) ×2 IMPLANT
NEEDLE HYPO 25X1 1.5 SAFETY (NEEDLE) ×2 IMPLANT
NS IRRIG 1000ML POUR BTL (IV SOLUTION) ×2 IMPLANT
PACK BASIN DAY SURGERY FS (CUSTOM PROCEDURE TRAY) ×2 IMPLANT
PENCIL BUTTON HOLSTER BLD 10FT (ELECTRODE) ×2 IMPLANT
SHEET MEDIUM DRAPE 40X70 STRL (DRAPES) IMPLANT
SLEEVE SCD COMPRESS KNEE MED (MISCELLANEOUS) ×2 IMPLANT
SPONGE GAUZE 4X4 12PLY STER LF (GAUZE/BANDAGES/DRESSINGS) ×2 IMPLANT
SPONGE LAP 18X18 X RAY DECT (DISPOSABLE) ×2 IMPLANT
STRIP CLOSURE SKIN 1/4X4 (GAUZE/BANDAGES/DRESSINGS) IMPLANT
SUT MNCRL AB 4-0 PS2 18 (SUTURE) ×2 IMPLANT
SUT VICRYL 3-0 CR8 SH (SUTURE) ×2 IMPLANT
SYR CONTROL 10ML LL (SYRINGE) ×2 IMPLANT
TOWEL OR 17X24 6PK STRL BLUE (TOWEL DISPOSABLE) ×2 IMPLANT
TOWEL OR NON WOVEN STRL DISP B (DISPOSABLE) IMPLANT
TUBE CONNECTING 20X1/4 (TUBING) ×2 IMPLANT
YANKAUER SUCT BULB TIP NO VENT (SUCTIONS) ×2 IMPLANT

## 2016-01-10 NOTE — Anesthesia Procedure Notes (Deleted)
Procedure Name: LMA Insertion Date/Time: 01/10/2016 11:14 AM Performed by: Toula Moos L Pre-anesthesia Checklist: Patient identified, Emergency Drugs available, Suction available, Patient being monitored and Timeout performed Patient Re-evaluated:Patient Re-evaluated prior to inductionOxygen Delivery Method: Circle system utilized Preoxygenation: Pre-oxygenation with 100% oxygen Intubation Type: IV induction Ventilation: Mask ventilation without difficulty LMA: LMA inserted LMA Size: 3.0 Number of attempts: 1 Airway Equipment and Method: Bite block Placement Confirmation: positive ETCO2 Tube secured with: Tape Dental Injury: Teeth and Oropharynx as per pre-operative assessment

## 2016-01-10 NOTE — Discharge Instructions (Signed)
[facility] - DISCHARGE INSTRUCTIONS TO PATIENT  Work:   I have attached a note to be out of work until 01/20/2016.  Activity:  Driving - May drive in 2 or three days, if doing well   Lifting - No lifting more than 15 pounds for 3 days, then no limit  Wound Care:   Leave bandage on for 2 days, then remove and shower.  Diet:  As tolerated  Follow up appointment:  Call Dr. Pollie Friar office [facility]) at [phone] for an appointment in 2 to 3 weeks.    Post Anesthesia Home Care Instructions  Activity: Get plenty of rest for the remainder of the day. A responsible adult should stay with you for 24 hours following the procedure.  For the next 24 hours, DO NOT: -Drive a car -Paediatric nurse -Drink alcoholic beverages -Take any medication unless instructed by your physician -Make any legal decisions or sign important papers.  Meals: Start with liquid foods such as gelatin or soup. Progress to regular foods as tolerated. Avoid greasy, spicy, heavy foods. If nausea and/or vomiting occur, drink only clear liquids until the nausea and/or vomiting subsides. Call your physician if vomiting continues.  Special Instructions/Symptoms: Your throat may feel dry or sore from the anesthesia or the breathing tube placed in your throat during surgery. If this causes discomfort, gargle with warm salt water. The discomfort should disappear within 24 hours.  If you had a scopolamine patch placed behind your ear for the management of post- operative nausea and/or vomiting:  1. The medication in the patch is effective for 72 hours, after which it should be removed.  Wrap patch in a tissue and discard in the trash. Wash hands thoroughly with soap and water. 2. You may remove the patch earlier than 72 hours if you experience unpleasant side effects which may include dry mouth, dizziness or visual disturbances. 3. Avoid touching the patch. Wash your hands with soap and water after  contact with the patch.     Medications and dosages:  Resume your home medications.  You have a prescription for:  Vicodin  Call Dr. Lucia Gaskins or his office  [phone]) if you have:  Temperature greater than 100.4,  Persistent nausea and vomiting,  Severe uncontrolled pain,  Redness, tenderness, or signs of infection (pain, swelling, redness, odor or green/yellow discharge around the site),  Difficulty breathing, headache or visual disturbances,  Any other questions or concerns you may have after discharge.  In an emergency, call 911 or go to an Emergency Department at a nearby hospital.

## 2016-01-10 NOTE — Anesthesia Preprocedure Evaluation (Addendum)
Anesthesia Evaluation  Patient identified by MRN, date of birth, ID band Patient awake    Reviewed: Allergy & Precautions, NPO status , Patient's Chart, lab work & pertinent test results  History of Anesthesia Complications Negative for: history of anesthetic complications  Airway Mallampati: II  TM Distance: >3 FB Neck ROM: Limited   Comment: Limited neck extension and flexion Dental  (+) Teeth Intact, Dental Advisory Given   Pulmonary neg shortness of breath, neg sleep apnea, pneumonia (recurrent 08/23/2015), resolved, neg COPD, neg recent URI, former smoker,  Reactive airway disease   Pulmonary exam normal breath sounds clear to auscultation       Cardiovascular negative cardio ROS   Rhythm:Regular Rate:Normal     Neuro/Psych neg Seizures H/o cervical fusion in 2012    GI/Hepatic negative GI ROS, Neg liver ROS, Alkaline phosphatase deficiency   Endo/Other  negative endocrine ROS  Renal/GU negative Renal ROS     Musculoskeletal   Abdominal   Peds  Hematology negative hematology ROS (+)   Anesthesia Other Findings Breast cancer  Reproductive/Obstetrics                            Anesthesia Physical Anesthesia Plan  ASA: II  Anesthesia Plan: General   Post-op Pain Management:    Induction: Intravenous  Airway Management Planned: LMA  Additional Equipment:   Intra-op Plan:   Post-operative Plan: Extubation in OR  Informed Consent: I have reviewed the patients History and Physical, chart, labs and discussed the procedure including the risks, benefits and alternatives for the proposed anesthesia with the patient or authorized representative who has indicated his/her understanding and acceptance.   Dental advisory given  Plan Discussed with: CRNA  Anesthesia Plan Comments: (Risks of general anesthesia discussed including, but not limited to, sore throat, hoarse voice,  chipped/damaged teeth, injury to vocal cords, nausea and vomiting, allergic reactions, lung infection, heart attack, stroke, and death. All questions answered. )       Anesthesia Quick Evaluation

## 2016-01-10 NOTE — Anesthesia Procedure Notes (Addendum)
Procedure Name: LMA Insertion Date/Time: 01/10/2016 11:14 AM Performed by: Toula Moos L Pre-anesthesia Checklist: Patient identified, Emergency Drugs available, Suction available, Patient being monitored and Timeout performed Patient Re-evaluated:Patient Re-evaluated prior to inductionOxygen Delivery Method: Circle system utilized Preoxygenation: Pre-oxygenation with 100% oxygen Intubation Type: IV induction Ventilation: Mask ventilation without difficulty LMA: LMA inserted LMA Size: 3.0 Number of attempts: 1 Airway Equipment and Method: Bite block Placement Confirmation: positive ETCO2 Tube secured with: Tape Dental Injury: Teeth and Oropharynx as per pre-operative assessment

## 2016-01-10 NOTE — Op Note (Signed)
01/10/2016  12:19 PM  PATIENT:  Barbara Fuentes DOB: 1965-08-02 MRN: [id number]  PREOP DIAGNOSIS:  RIGHT BREAST CANCER  POSTOP DIAGNOSIS:   Right breast cancer, 8 o'clock position (Tis, N0)  PROCEDURE:   Procedure(s): Right BREAST LUMPECTOMY WITH RADIOACTIVE SEED LOCALIZATION  SURGEON:   [name], M.D.  ANESTHESIA:   general  CRNA: Marrianne Mood, CRNA  General  EBL:  minimal  ml  DRAINS: none   LOCAL MEDICATIONS USED:   30 cc 1/4% Marcaine  SPECIMEN:   Left breast lumpectomy (painted x 6), lateral margin (suture anterior and painted x 2)  COUNTS CORRECT:  YES  INDICATIONS FOR PROCEDURE:  [name] is a 50 y.o. (DOB: [birth year]) white female whose primary care physician is [name], MD and comes for right breast lumpectomy.   She [facility] with Drs. Philis Nettle.  Her biopsies showed only DCIS, so she is a candidate for right breast lumpectomy.   She elected to proceed with right breast lumpectomy.     The indications and potential complications of surgery were explained to the patient. Potential complications include, but are not limited to, bleeding, infection, the need for further surgery, and nerve injury.     She had a I131 seed placed on 01/08/2016 in her right breast at [facility].  I confirmed the presence of the I131 seed in the pre op area using the Neoprobe.  The seed is in the 8 o'clock position of the right breast.  OPERATIVE NOTE:   The patient was taken to room # 6 at [facility] where she underwent a general anesthesia  supervised by CRNA: Marrianne Mood, CRNA. Her right breast and axilla were prepped with  ChloraPrep and sterilely draped.    A time-out and the surgical check list was reviewed.    The cancer was about at the 8 o'clock position of the right breast.   I used the Neoprobe to identify the I131 seed.  I tried to excise an area around the tumor of at least 1 cm.    I excised this  block of breast tissue approximately 3 cm by 4 cm  in diameter.  I took the dissection down to the chest wall.   I painted the lumpectomy specimen with the 6 color paint kit and did a specimen mammogram which confirmed the mass, clip, and the seed were all in the right position in the specimen.  The specimen was sent to pathology who called back to confirm that they have the seed and the specimen.   The DCIS sat in a clump of fibrocystic breast, in fact I cut through several small cyst that contained greenish pastey material.  I excised the lateral margin, which was the remainder of the fibrocystic disease that I could see.  I had excised back to fatty tissue on all sides.   I then irrigated the wound with saline. I infiltrated approximately 30 mL of 1/4% marcaine in the incision.  I placed 4 clips to mark biopsy cavity, at 12, 3, 6, and 9 o'clock.   I then closed all the wounds in layers using 3-0 Vicryl sutures for the deep layer. At the skin, I closed the incisions with a 4-0 Monocryl suture. The incisions were then painted with LiquiBand.  She had gauze place over the wounds and placed in a breast binder.   The patient tolerated the procedure well, was transported to the recovery room  in good condition. Sponge and needle count were correct at the end of the case.   Final pathology is pending.   [name], MD, [facility] Pager: [phone] Office phone:  [phone]

## 2016-01-10 NOTE — Anesthesia Postprocedure Evaluation (Signed)
Anesthesia Post Note  Patient: PRANITA ARA  Procedure(s) Performed: Procedure(s) (LRB): BREAST LUMPECTOMY WITH RADIOACTIVE SEED LOCALIZATION (Right)  Patient location during evaluation: PACU Anesthesia Type: General Level of consciousness: awake and alert Pain management: pain level controlled Vital Signs Assessment: post-procedure vital signs reviewed and stable Respiratory status: spontaneous breathing, nonlabored ventilation and respiratory function stable Cardiovascular status: blood pressure returned to baseline and stable Postop Assessment: no signs of nausea or vomiting Anesthetic complications: no    Last Vitals:  Vitals:   01/10/16 1245 01/10/16 1344  BP: 110/71 116/61  Pulse: 69 79  Resp: 14   Temp:  37.1 C    Last Pain:  Vitals:   01/10/16 1328  TempSrc:   PainSc: 0-No pain                 Nilda Simmer

## 2016-01-10 NOTE — Interval H&P Note (Signed)
History and Physical Interval Note:  01/10/2016 10:47 AM  Barbara Fuentes  has presented today for surgery, with the diagnosis of RIGHT BREAST CANCER  The various methods of treatment have been discussed with the patient and family. Seed in good location.  Her husband and daughter are in the room.  After consideration of risks, benefits and other options for treatment, the patient has consented to  Procedure(s): BREAST LUMPECTOMY WITH RADIOACTIVE SEED LOCALIZATION (Right) as a surgical intervention .  The patient's history has been reviewed, patient examined, no change in status, stable for surgery.  I have reviewed the patient's chart and labs.  Questions were answered to the patient's satisfaction.     Makhari Dovidio H

## 2016-01-10 NOTE — Transfer of Care (Signed)
Immediate Anesthesia Transfer of Care Note  Patient: Barbara Fuentes  Procedure(s) Performed: Procedure(s): BREAST LUMPECTOMY WITH RADIOACTIVE SEED LOCALIZATION (Right)  Patient Location: PACU  Anesthesia Type:General  Level of Consciousness: awake and patient cooperative  Airway & Oxygen Therapy: Patient Spontanous Breathing and Patient connected to face mask oxygen  Post-op Assessment: Report given to RN and Post -op Vital signs reviewed and stable  Post vital signs: Reviewed and stable  Last Vitals:  Vitals:   01/10/16 1026  BP: 100/66  Pulse: 71  Resp: 18  Temp: 36.7 C    Last Pain:  Vitals:   01/10/16 1026  TempSrc: Oral         Complications: No apparent anesthesia complications

## 2016-01-13 ENCOUNTER — Encounter (HOSPITAL_BASED_OUTPATIENT_CLINIC_OR_DEPARTMENT_OTHER): Payer: Self-pay | Admitting: Surgery

## 2016-01-13 ENCOUNTER — Ambulatory Visit: Payer: Self-pay | Admitting: Genetic Counselor

## 2016-01-13 DIAGNOSIS — Z1379 Encounter for other screening for genetic and chromosomal anomalies: Secondary | ICD-10-CM

## 2016-01-13 DIAGNOSIS — Z171 Estrogen receptor negative status [ER-]: Secondary | ICD-10-CM

## 2016-01-13 DIAGNOSIS — Z8042 Family history of malignant neoplasm of prostate: Secondary | ICD-10-CM

## 2016-01-13 DIAGNOSIS — Z800 Family history of malignant neoplasm of digestive organs: Secondary | ICD-10-CM

## 2016-01-13 DIAGNOSIS — C50511 Malignant neoplasm of lower-outer quadrant of right female breast: Secondary | ICD-10-CM

## 2016-01-14 ENCOUNTER — Other Ambulatory Visit: Payer: Self-pay | Admitting: Surgery

## 2016-01-17 ENCOUNTER — Encounter (HOSPITAL_COMMUNITY): Payer: Self-pay | Admitting: *Deleted

## 2016-01-19 NOTE — H&P (Signed)
Barbara Fuentes  Location: [facility] Patient #: [id number] DOB: 07-Dec-1965 Married / Language: English / Race: White Female  History of Present Illness   The patient is a 50 year old female who presents with a complaint of breast cancer.   The PCP is Dr. Frederik Pear (she was a patient of Dr. Linna Darner)  The patient was referred by Dr. Domenick Bookbinder.  She came through the Breast [facility] clinic with Drs. Philis Nettle.  She comes with her husband.   I did a right breast lumpectomy on her on 01/10/2016. 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 At the time of surgery, I thought the lateral margin was close, so I took additional lateral margin - the new lateral margin showed 2.5 cm more of DCIS with a positive lateral margin.  She came back with her husband today to discuss the path report. I think that she is still a candidate for breast conservation, though the tumor is larger than originally thought. I reviewed the pathology with her and we will go ahead with a re-excision. I the re-excision is still positive, we will proceed with mastectomy.  Her sister in law is going through breast cancer treatment - has a much worse prognosis - is getting chemotx and I think that this is weighing on her mind.  They have travel plans in Thanksgiving - the delay in getting clean margins may push rad tx into their travel plans.  History of breast cancer: She gets annual mammograms. She has had some soreness in her right breast, but noticed nothing else unusual. [name] underwent a mammogram at [facility] [place] on 12/18/2015 which showed right breast microca++ in the LOQ. The microca++ are about 2.0 cm. 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 She is not on hormone tx. She has had an endometrial ablation. She has no fam hx of breast  cancer.  Plan - 1) Wider excision of margins, 2) rad tx, 3) antihormal tx, maybe  Past Medical History: 1. Cervical fusion - 07/30/2010 - Dr. Vertell Limber 2. Quit smoking in 2012 3. Questions about her LFT that Huey Bienenstock was following - but the labs today look good. 4. Has not had a colonoscopy  Social History: Married. Her husband was in [place] at the time of our original visit at the [facility]. She has two children: Barbara Fuentes 55 yo son, Barbara Fuentes 69 yo daughter, and 4 step children. 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   She comes with her friend, Wilmon Pali. Susa Raring told me that she read bad things about our practice on [employer]. She complained about Jeannie who interviewed her.]   Other Problems Shann Medal, MD; 01/16/2016 5:03 PM) Arthritis Bladder Problems Hemorrhoids  Past Surgical History Shann Medal, MD; 01/16/2016 5:03 PM) Breast Biopsy Right.  Allergies Elbert Ewings, [place]; 01/16/2016 4:56 PM) Codeine Sulfate *ANALGESICS - OPIOID*  Medication History Elbert Ewings, CMA; 01/16/2016 4:56 PM) No Current Medications Medications Reconciled  Vitals Elbert Ewings CMA; 01/16/2016 4:57 PM) 01/16/2016 4:56 PM Weight: 150 lb Height: 67in Body Surface Area: 1.79 m Body Mass Index: 23.49 kg/m  Temp.: 97.72F(Temporal)  Pulse: 57 (Regular)  BP: 122/82 (Sitting, Left Arm, Standard)  Physical Exam  General: WN WF alert and generally healthy appearing. Skin: Inspection and palpation of the skin unremarkable.  Eyes: Conjunctivae white, pupils equal. Face, ears, nose,  mouth, and throat: Face - normal. Normal ears and nose. Lips and teeth normal.  Neck: Supple. No mass. Trachea midline. No thyroid mass. She has a scar from a left anterior cervical approach. Lymph Nodes: No supraclavicular, cervical, or axillary adenopathy.  Lungs: Normal respiratory effort. Clear to auscultation and symmetric breath  sounds. Cardiovascular: Regular rate and rythm. Normal auscultation of the heart. No murmur or rub. Normal carotid pulse.  Breasts: Right - LOQ incision looks good - some loss of tissue. The wound is healing well. Left - No mass or nodule.   Assessment & Plan  1.  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   Right breast lumpectomy on her on 01/10/2016. 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 At the time of surgery, I thought the lateral margin was close, so I took additional lateral margin - the new lateral margin showed 2.5 cm more of DCIS with a positive lateral margin.   [place] - Drs. Philis Nettle  Plan:   1) Re-excision of right breast lumpecotmy  2. Cervical fusion - 07/30/2010 - Dr. Vertell Limber 3. Quit smoking in 2012  [name], MD, [facility] Pager: [phone] Office phone:  [phone]

## 2016-01-20 ENCOUNTER — Ambulatory Visit (HOSPITAL_COMMUNITY): Payer: BLUE CROSS/BLUE SHIELD | Admitting: Anesthesiology

## 2016-01-20 ENCOUNTER — Ambulatory Visit (HOSPITAL_COMMUNITY)
Admission: RE | Admit: 2016-01-20 | Discharge: 2016-01-20 | Disposition: A | Discharge status: Home / Self Care | Payer: BLUE CROSS/BLUE SHIELD | Source: Ambulatory Visit | Attending: Surgery | Admitting: Surgery

## 2016-01-20 ENCOUNTER — Encounter (HOSPITAL_COMMUNITY)
Admission: RE | Disposition: A | Discharge status: Home / Self Care | Payer: Self-pay | Source: Ambulatory Visit | Attending: Surgery

## 2016-01-20 ENCOUNTER — Encounter (HOSPITAL_COMMUNITY): Payer: Self-pay | Admitting: *Deleted

## 2016-01-20 DIAGNOSIS — Z79899 Other long term (current) drug therapy: Secondary | ICD-10-CM | POA: Insufficient documentation

## 2016-01-20 DIAGNOSIS — Z87891 Personal history of nicotine dependence: Secondary | ICD-10-CM | POA: Diagnosis not present

## 2016-01-20 DIAGNOSIS — D0591 Unspecified type of carcinoma in situ of right breast: Secondary | ICD-10-CM | POA: Diagnosis not present

## 2016-01-20 DIAGNOSIS — N6091 Unspecified benign mammary dysplasia of right breast: Secondary | ICD-10-CM | POA: Diagnosis not present

## 2016-01-20 HISTORY — PX: BREAST LUMPECTOMY: SHX2

## 2016-01-20 HISTORY — DX: Unspecified osteoarthritis, unspecified site: M19.90

## 2016-01-20 HISTORY — DX: Pneumonia, unspecified organism: J18.9

## 2016-01-20 LAB — HCG, SERUM, QUALITATIVE: Preg, Serum: NEGATIVE

## 2016-01-20 SURGERY — BREAST LUMPECTOMY
Anesthesia: General | Laterality: Right

## 2016-01-20 MED ORDER — OXYCODONE HCL 5 MG/5ML PO SOLN
5.0000 mg | Freq: Once | ORAL | Status: DC | PRN
  Filled 2016-01-20: qty 5

## 2016-01-20 MED ORDER — FENTANYL CITRATE (PF) 100 MCG/2ML IJ SOLN
INTRAMUSCULAR | Status: AC
  Filled 2016-01-20: qty 2

## 2016-01-20 MED ORDER — ONDANSETRON HCL 4 MG/2ML IJ SOLN
INTRAMUSCULAR | Status: AC
  Filled 2016-01-20: qty 2

## 2016-01-20 MED ORDER — MIDAZOLAM HCL 5 MG/5ML IJ SOLN
INTRAMUSCULAR | Status: DC | PRN
  Administered 2016-01-20 (×2): 1 mg via INTRAVENOUS

## 2016-01-20 MED ORDER — CHLORHEXIDINE GLUCONATE CLOTH 2 % EX PADS: 6.0000 | MEDICATED_PAD | Freq: Once | CUTANEOUS | Status: DC

## 2016-01-20 MED ORDER — PROPOFOL 10 MG/ML IV BOLUS
INTRAVENOUS | Status: DC | PRN
  Administered 2016-01-20: 150 mg via INTRAVENOUS

## 2016-01-20 MED ORDER — 0.9 % SODIUM CHLORIDE (POUR BTL) OPTIME
TOPICAL | Status: DC | PRN
  Administered 2016-01-20: 1000 mL

## 2016-01-20 MED ORDER — EPINEPHRINE HCL 0.1 MG/ML IJ SOSY
PREFILLED_SYRINGE | INTRAMUSCULAR | Status: AC
  Filled 2016-01-20: qty 10

## 2016-01-20 MED ORDER — DEXAMETHASONE SODIUM PHOSPHATE 10 MG/ML IJ SOLN
INTRAMUSCULAR | Status: AC
  Filled 2016-01-20: qty 1

## 2016-01-20 MED ORDER — ONDANSETRON HCL 4 MG/2ML IJ SOLN
INTRAMUSCULAR | Status: DC | PRN
  Administered 2016-01-20: 4 mg via INTRAVENOUS

## 2016-01-20 MED ORDER — CEFAZOLIN SODIUM-DEXTROSE 2-4 GM/100ML-% IV SOLN
INTRAVENOUS | Status: AC
  Filled 2016-01-20: qty 100

## 2016-01-20 MED ORDER — FENTANYL CITRATE (PF) 100 MCG/2ML IJ SOLN
25.0000 ug | INTRAMUSCULAR | Status: DC | PRN
  Administered 2016-01-20: 50 ug via INTRAVENOUS

## 2016-01-20 MED ORDER — ACETAMINOPHEN 500 MG PO TABS
1000.0000 mg | ORAL_TABLET | ORAL | Status: AC
  Administered 2016-01-20: 1000 mg via ORAL
  Filled 2016-01-20: qty 2

## 2016-01-20 MED ORDER — PROPOFOL 10 MG/ML IV BOLUS
INTRAVENOUS | Status: AC
  Filled 2016-01-20: qty 20

## 2016-01-20 MED ORDER — ACETAMINOPHEN 160 MG/5ML PO SOLN: 325.0000 mg | ORAL | Status: DC | PRN

## 2016-01-20 MED ORDER — MIDAZOLAM HCL 2 MG/2ML IJ SOLN
INTRAMUSCULAR | Status: AC
  Filled 2016-01-20: qty 2

## 2016-01-20 MED ORDER — OXYCODONE HCL 5 MG PO TABS: 5.0000 mg | ORAL_TABLET | Freq: Once | ORAL | Status: DC | PRN

## 2016-01-20 MED ORDER — BUPIVACAINE-EPINEPHRINE 0.5% -1:200000 IJ SOLN
INTRAMUSCULAR | Status: AC
  Filled 2016-01-20: qty 1

## 2016-01-20 MED ORDER — LIDOCAINE 2% (20 MG/ML) 5 ML SYRINGE
INTRAMUSCULAR | Status: AC
  Filled 2016-01-20: qty 5

## 2016-01-20 MED ORDER — BUPIVACAINE-EPINEPHRINE 0.5% -1:200000 IJ SOLN
INTRAMUSCULAR | Status: DC | PRN
  Administered 2016-01-20: 20 mL

## 2016-01-20 MED ORDER — CEFAZOLIN SODIUM-DEXTROSE 2-4 GM/100ML-% IV SOLN
2.0000 g | INTRAVENOUS | Status: AC
  Administered 2016-01-20: 2 g via INTRAVENOUS
  Filled 2016-01-20: qty 100

## 2016-01-20 MED ORDER — LIDOCAINE 2% (20 MG/ML) 5 ML SYRINGE
INTRAMUSCULAR | Status: DC | PRN
  Administered 2016-01-20: 50 mg via INTRAVENOUS

## 2016-01-20 MED ORDER — LACTATED RINGERS IV SOLN
INTRAVENOUS | Status: DC
  Administered 2016-01-20: 13:00:00 via INTRAVENOUS

## 2016-01-20 MED ORDER — ACETAMINOPHEN 325 MG PO TABS: 325.0000 mg | ORAL_TABLET | ORAL | Status: DC | PRN

## 2016-01-20 MED ORDER — DEXAMETHASONE SODIUM PHOSPHATE 10 MG/ML IJ SOLN
INTRAMUSCULAR | Status: DC | PRN
  Administered 2016-01-20: 10 mg via INTRAVENOUS

## 2016-01-20 MED ORDER — FENTANYL CITRATE (PF) 100 MCG/2ML IJ SOLN
INTRAMUSCULAR | Status: DC | PRN
  Administered 2016-01-20: 50 ug via INTRAVENOUS
  Administered 2016-01-20 (×2): 25 ug via INTRAVENOUS

## 2016-01-20 MED ORDER — GABAPENTIN 300 MG PO CAPS
300.0000 mg | ORAL_CAPSULE | ORAL | Status: DC
  Filled 2016-01-20: qty 1

## 2016-01-20 SURGICAL SUPPLY — 37 items
BANDAGE ACE 6X5 VEL STRL LF (GAUZE/BANDAGES/DRESSINGS) ×2 IMPLANT
BINDER BREAST LRG (GAUZE/BANDAGES/DRESSINGS) ×2 IMPLANT
BLADE SURG 15 STRL LF DISP TIS (BLADE) ×3 IMPLANT
BLADE SURG 15 STRL SS (BLADE) ×6
CLEANER TIP ELECTROSURG 2X2 (MISCELLANEOUS) ×2 IMPLANT
COVER SURGICAL LIGHT HANDLE (MISCELLANEOUS) ×2 IMPLANT
DECANTER SPIKE VIAL GLASS SM (MISCELLANEOUS) ×2 IMPLANT
DRAPE LAPAROTOMY TRNSV 102X78 (DRAPE) ×2 IMPLANT
ELECT COATED BLADE 2.86 ST (ELECTRODE) ×2 IMPLANT
ELECT PENCIL ROCKER SW 15FT (MISCELLANEOUS) ×2 IMPLANT
ELECT REM PT RETURN 9FT ADLT (ELECTROSURGICAL) ×2
ELECTRODE REM PT RTRN 9FT ADLT (ELECTROSURGICAL) ×1 IMPLANT
GAUZE SPONGE 4X4 12PLY STRL (GAUZE/BANDAGES/DRESSINGS) ×2 IMPLANT
GAUZE SPONGE 4X4 16PLY XRAY LF (GAUZE/BANDAGES/DRESSINGS) ×2 IMPLANT
GLOVE BIOGEL PI IND STRL 7.0 (GLOVE) ×1 IMPLANT
GLOVE BIOGEL PI INDICATOR 7.0 (GLOVE) ×1
GLOVE SURG SIGNA 7.5 PF LTX (GLOVE) ×2 IMPLANT
GOWN SPEC L4 XLG W/TWL (GOWN DISPOSABLE) ×2 IMPLANT
GOWN STRL REUS W/ TWL XL LVL3 (GOWN DISPOSABLE) ×3 IMPLANT
GOWN STRL REUS W/TWL LRG LVL3 (GOWN DISPOSABLE) ×2 IMPLANT
GOWN STRL REUS W/TWL XL LVL3 (GOWN DISPOSABLE) ×6
KIT BASIN OR (CUSTOM PROCEDURE TRAY) ×2 IMPLANT
KIT MARKER MARGIN INK (KITS) ×2 IMPLANT
MARKER SKIN DUAL TIP RULER LAB (MISCELLANEOUS) ×2 IMPLANT
NEEDLE HYPO 22GX1.5 SAFETY (NEEDLE) ×2 IMPLANT
NEEDLE HYPO 25X1 1.5 SAFETY (NEEDLE) ×2 IMPLANT
PACK BASIC VI WITH GOWN DISP (CUSTOM PROCEDURE TRAY) ×2 IMPLANT
SOL PREP POV-IOD 4OZ 10% (MISCELLANEOUS) ×2 IMPLANT
SPONGE LAP 4X18 X RAY DECT (DISPOSABLE) ×2 IMPLANT
STRIP CLOSURE SKIN 1/2X4 (GAUZE/BANDAGES/DRESSINGS) ×4 IMPLANT
SUT VIC AB 3-0 SH 18 (SUTURE) ×2 IMPLANT
SUT VIC AB 5-0 P-3 18XBRD (SUTURE) ×1 IMPLANT
SUT VIC AB 5-0 P3 18 (SUTURE) ×2
SYR BULB IRRIGATION 50ML (SYRINGE) ×2 IMPLANT
SYR CONTROL 10ML LL (SYRINGE) ×2 IMPLANT
TOWEL OR 17X26 10 PK STRL BLUE (TOWEL DISPOSABLE) ×2 IMPLANT
YANKAUER SUCT BULB TIP 10FT TU (MISCELLANEOUS) ×2 IMPLANT

## 2016-01-20 NOTE — Op Note (Signed)
01/20/2016  3:35 PM  PATIENT:  Barbara Fuentes DOB: 1965/09/10 MRN: [id number]  PREOP DIAGNOSIS:  RIGHT BREAST CANCER  POSTOP DIAGNOSIS:   RIGHT BREAST CANCER, DCIS, 7 o'clock position (Tis, N0)  PROCEDURE:   Procedure(s):  RIGHT BREAST LUMPECTOMY, re-excision of lateral margin and deep margin (2 specimens)  SURGEON:   [name], M.D.  ANESTHESIA:   general  Anesthesiologist: Oleta Mouse, MD; Effie Berkshire, MD CRNA: Dione Booze, CRNA  General  EBL:  minimal  ml  DRAINS: none   LOCAL MEDICATIONS USED:   20 1/4% marcaine  SPECIMEN:   Lateral margin and deep margin  COUNTS CORRECT:  YES  INDICATIONS FOR PROCEDURE:  Barbara Fuentes is a 50 y.o. (DOB: 01-02-1966) white female whose primary care physician is [name], MD and comes for right breast lumpectomy , wider excision of lateral margin.   She was seen at the [facility] with Drs. Philis Nettle.  Her biopsies showed only DCIS, so she is a candidate for right breast lumpectomy.   She elected to proceed with right breast lumpectomy.    She had a right lumpectomy on 01/10/2016, but the final pathology showed s widely positive lateral margin.  So she returns to re-excise the lateral margin.    The indications and potential complications of surgery were explained to the patient. Potential complications include, but are not limited to, bleeding, infection, the need for further surgery, and nerve injury.    OPERATIVE NOTE:   The patient was taken to room # 4 at [place] where she underwent a general anesthesia  supervised by Anesthesiologist: Oleta Mouse, MD; Effie Berkshire, MD CRNA: Dione Booze, CRNA. Her right breast and axilla were prepped with  ChloraPrep and sterilely draped.    A time-out and the surgical check list was reviewed.    I excised the skin of the lateral aspect of her former incision.  I extended the incision another 2.0 cm laterally.  I then excised the  lateral 1/2 of her prior biopsy cavity.  I thought I got at least an additional 0.8 cm of breast thickness, but ran into the skin laterally.   I excised this block of breast tissue approximately 5 cm x 4 cm x 1 cm.   I painted the lumpectomy specimen with the color paint kit.  There was a piece of the skin which should orient it to the anterior aspect of the biopsy.    There was also some fat over the pectoralis and chest wall, so I excised a 0.5 cm thickness of remaining breast fat at the base of the lumpectomy bed.   I then irrigated the wound with 1,500 saline. I infiltrated approximately 20 mL of 1/4% marcaine in the incision.  I then closed all the wounds in layers using 3-0 Vicryl sutures for the deep layer. At the skin, I closed the incisions with a 4-0 Monocryl suture. The incisions were then painted with LiquiBand.  She had gauze place over the wounds and placed in a breast binder.   The patient tolerated the procedure well, was transported to the recovery room in good condition. Sponge and needle count were correct at the end of the case.   Final pathology is pending.   [name], MD, [facility] Pager: [phone] Office phone:  [phone]

## 2016-01-20 NOTE — Interval H&P Note (Signed)
History and Physical Interval Note:  01/20/2016 1:51 PM  Barbara Fuentes  has presented today for surgery, with the diagnosis of RIGHT BREAST CANCER  The various methods of treatment have been discussed with the patient and family.   Husband is here with her.  After consideration of risks, benefits and other options for treatment, the patient has consented to  Procedure(s): RIGHT BREAST LUMPECTOMY (Right) as a surgical intervention .  The patient's history has been reviewed, patient examined, no change in status, stable for surgery.  I have reviewed the patient's chart and labs.  Questions were answered to the patient's satisfaction.     Falana Clagg H

## 2016-01-20 NOTE — Anesthesia Postprocedure Evaluation (Signed)
Anesthesia Post Note  Patient: Barbara Fuentes  Procedure(s) Performed: Procedure(s) (LRB): RIGHT BREAST LUMPECTOMY (Right)  Patient location during evaluation: PACU Anesthesia Type: General Level of consciousness: awake and alert Pain management: pain level controlled Vital Signs Assessment: post-procedure vital signs reviewed and stable Respiratory status: spontaneous breathing, nonlabored ventilation, respiratory function stable and patient connected to nasal cannula oxygen Cardiovascular status: blood pressure returned to baseline and stable Postop Assessment: no signs of nausea or vomiting Anesthetic complications: no    Last Vitals:  Vitals:   01/20/16 1630 01/20/16 1652  BP: 114/70 105/63  Pulse: 64 64  Resp: 12 12  Temp: 37.2 C 37.4 C    Last Pain:  Vitals:   01/20/16 1655  TempSrc:   PainSc: Doctor Phillips Kenroy Timberman

## 2016-01-20 NOTE — Discharge Instructions (Signed)
[facility] - DISCHARGE INSTRUCTIONS TO PATIENT  Activity:  Driving - May drive tomorrow, if doing well.   Lifting - No lifting more than 15 pounds for 5 days, then no limit  Wound Care:   Leave binder on for 2 days, then remove and shower.  Diet:  As tolerated.  Follow up appointment:  Call Dr. Pollie Friar office [facility]) at [phone] for an appointment in 1 - 2 weeks.  Medications and dosages:  Resume your home medications.  Call Dr. Lucia Gaskins or his office  [phone]) if you have:  Temperature greater than 100.4,  Persistent nausea and vomiting,  Severe uncontrolled pain,  Redness, tenderness, or signs of infection (pain, swelling, redness, odor or green/yellow discharge around the site),  Difficulty breathing, headache or visual disturbances,  Any other questions or concerns you may have after discharge.  In an emergency, call 911 or go to an Emergency Department at a nearby hospital. Deep breathing 10 times every hour

## 2016-01-20 NOTE — Transfer of Care (Signed)
Immediate Anesthesia Transfer of Care Note  Patient: Barbara Fuentes  Procedure(s) Performed: Procedure(s): RIGHT BREAST LUMPECTOMY (Right)  Patient Location: PACU  Anesthesia Type:General  Level of Consciousness: awake, alert , oriented and patient cooperative  Airway & Oxygen Therapy: Patient Spontanous Breathing and Patient connected to face mask oxygen  Post-op Assessment: Report given to RN and Post -op Vital signs reviewed and stable  Post vital signs: Reviewed and stable  Last Vitals:  Vitals:   01/20/16 1220  BP: 109/67  Pulse: (!) 58  Resp: 16  Temp: 36.6 C    Last Pain:  Vitals:   01/20/16 1220  TempSrc: Oral         Complications: No apparent anesthesia complications

## 2016-01-20 NOTE — Anesthesia Preprocedure Evaluation (Signed)
Anesthesia Evaluation  Patient identified by MRN, date of birth, ID band Patient awake    Reviewed: Allergy & Precautions, NPO status , Patient's Chart, lab work & pertinent test results  History of Anesthesia Complications Negative for: history of anesthetic complications  Airway Mallampati: II  TM Distance: >3 FB Neck ROM: Limited   Comment: Limited neck extension and flexion Dental  (+) Teeth Intact, Dental Advisory Given   Pulmonary neg shortness of breath, neg sleep apnea, Pneumonia: recurrent 08/23/2015., neg COPD, neg recent URI, former smoker,  Reactive airway disease   Pulmonary exam normal breath sounds clear to auscultation       Cardiovascular negative cardio ROS   Rhythm:Regular Rate:Normal     Neuro/Psych neg Seizures H/o cervical fusion in 2012    GI/Hepatic negative GI ROS, Neg liver ROS, Alkaline phosphatase deficiency   Endo/Other  negative endocrine ROS  Renal/GU negative Renal ROS     Musculoskeletal  (+) Arthritis ,   Abdominal   Peds  Hematology negative hematology ROS (+)   Anesthesia Other Findings   Reproductive/Obstetrics                             Anesthesia Physical Anesthesia Plan  ASA: II  Anesthesia Plan: General   Post-op Pain Management:    Induction: Intravenous  Airway Management Planned: LMA  Additional Equipment: None  Intra-op Plan:   Post-operative Plan: Extubation in OR  Informed Consent: I have reviewed the patients History and Physical, chart, labs and discussed the procedure including the risks, benefits and alternatives for the proposed anesthesia with the patient or authorized representative who has indicated his/her understanding and acceptance.   Dental advisory given  Plan Discussed with: CRNA and Surgeon  Anesthesia Plan Comments:         Anesthesia Quick Evaluation

## 2016-01-20 NOTE — Anesthesia Procedure Notes (Signed)
Procedure Name: LMA Insertion Date/Time: 01/20/2016 2:25 PM Performed by: Dione Booze Pre-anesthesia Checklist: Patient identified, Emergency Drugs available, Suction available and Patient being monitored Patient Re-evaluated:Patient Re-evaluated prior to inductionOxygen Delivery Method: Circle system utilized Preoxygenation: Pre-oxygenation with 100% oxygen Intubation Type: IV induction Ventilation: Mask ventilation without difficulty LMA: LMA inserted LMA Size: 3.0 Number of attempts: 1 Placement Confirmation: positive ETCO2 Tube secured with: Tape Dental Injury: Teeth and Oropharynx as per pre-operative assessment

## 2016-01-22 NOTE — Progress Notes (Signed)
Location of Breast Cancer: Right Breast  Histology per Pathology Report:  12/19/15 Diagnosis Breast, right, needle core biopsy, calcs in 12, center, 6 and 9 o'clock, LOQ - DUCTAL CARCINOMA IN SITU WITH CALCIFICATIONS. - STROMAL CALCIFICATIONS. - COMPLEX SCLEROSING LESION.  Receptor Status: ER(NEG), PR (NEG)  01/10/16 Diagnosis 1. Breast, lumpectomy, Right w/seed - DUCTAL CARCINOMA IN SITU WITH NECROSIS AND CALCIFICATIONS, 3 CM. - DUCTAL CARCINOMA IN SITU FOCALLY LESS THAN 0.1 CM FROM ANTERIOR MARGIN - FIBROCYSTIC CHANGES WITH CALCIFICATIONS. - COMPLEX SCLEROSING LESION WITH CALCIFICATIONS. - PREVIOUS BIOPSY SITE. 2. Breast, excision, Right Lateral Margin - DUCTAL CARCINOMA IN SITU WITH CALCIFICATIONS AND NECROSIS, 2.5 CM. - DUCTAL CARCINOMA IN SITU BROADLY INVOLVES THE MEDIAL AND LATERAL MARGINS. - FIBROCYSTIC CHANGES.  01/20/16 Diagnosis 1. Breast, excision, right USUAL DUCTAL HYPERPLASIA FIBROCYSTIC CHANGES PREVIOUS RESECTION SITE CHANGES ALL MARGINS OF RESECTION ARE NEGATIVE FOR CARCINOMA 2. Breast, excision, right posterior BENIGN FIBROADIPOSE MUSCULAR TISSUE PREVIOUS RESECTION SITE CHANGES NO EVIDENCE OF RESIDUAL CARCINOMA   Did patient present with symptoms or was this found on screening mammography?: It was found on a screening mammogram.   Past/Anticipated interventions by surgeon, if any: 01/10/16 PROCEDURE:   Procedure(s): Right BREAST LUMPECTOMY WITH RADIOACTIVE SEED LOCALIZATION SURGEON:   [name], M.D.  01/20/16 PROCEDURE:   Procedure(s):  RIGHT BREAST LUMPECTOMY, re-excision of lateral margin and deep margin (2 specimens) SURGEON:   [name], M.D.  Past/Anticipated interventions by medical oncology, if any:  Dr. Lindi Adie saw in the Breast Clinic. He saw her again 01/24/16.   Lymphedema issues, if any:  No  Pain issues, if any:No  SAFETY ISSUES:  Prior radiation? No  Pacemaker/ICD? No  Possible current pregnancy? No  Is the patient on  methotrexate? No  Menarche 3, G4,P2, BC 20 years, HRT No No family history of breast cancer Wt Readings from Last 3 Encounters:  01/27/16 152 lb (68.9 kg)  01/24/16 150 lb (68 kg)  01/20/16 148 lb 2 oz (67.2 kg)  BP 107/70 (BP Location: Left Arm, Patient Position: Sitting, Cuff Size: Normal)   Pulse 68   Temp 98.3 F (36.8 C) (Oral)   Resp 18   Ht 5\' 7"  (1.702 m)   Wt 152 lb (68.9 kg)   LMP 04/25/2015 Comment: Uterine ablations  SpO2 100%   BMI 23.81 kg/m     Current Complaints / other details:      [place]elt, Stephani Police, RN 01/22/2016,4:32 PM

## 2016-01-24 ENCOUNTER — Ambulatory Visit (HOSPITAL_BASED_OUTPATIENT_CLINIC_OR_DEPARTMENT_OTHER): Payer: BLUE CROSS/BLUE SHIELD | Admitting: Hematology and Oncology

## 2016-01-24 ENCOUNTER — Encounter: Payer: Self-pay | Admitting: Hematology and Oncology

## 2016-01-24 DIAGNOSIS — C50511 Malignant neoplasm of lower-outer quadrant of right female breast: Secondary | ICD-10-CM

## 2016-01-24 DIAGNOSIS — D0511 Intraductal carcinoma in situ of right breast: Secondary | ICD-10-CM

## 2016-01-24 DIAGNOSIS — Z171 Estrogen receptor negative status [ER-]: Secondary | ICD-10-CM | POA: Diagnosis not present

## 2016-01-24 NOTE — Progress Notes (Signed)
Patient Care Team: [name], MD as PCP - General (Family Medicine) Brien Few, MD as Consulting Physician (Obstetrics and Gynecology) Willow Ora as Consulting Physician (Optometry) Nani Ravens, MD as Consulting Physician (Dermatology) [name], MD as Consulting Physician (General Surgery) Nicholas Lose, MD as Consulting Physician (Hematology and Oncology) Eppie Gibson, MD as Attending Physician (Radiation Oncology)  DIAGNOSIS: Breast cancer of lower-outer quadrant of right female breast [facility])   Staging form: Breast, AJCC 7th Edition   - Clinical stage from 12/25/2015: Stage 0 (Tis (DCIS), N0, M0) - Unsigned  SUMMARY OF ONCOLOGIC HISTORY:   Breast cancer of lower-outer quadrant of right female breast ([place])   12/18/2015 Mammogram    Right breast: 2 cm group of suspicious calcifications within the outer right breast      12/19/2015 Initial Diagnosis    Right breast DCIS with calcifications, complex sclerosing lesion, ER 0%, PR 0%, Tis N0 stage 0      01/20/2016 Surgery    Right breast excision: UDH, fibrocystic changes; right posterior excision: Benign, no evidence of carcinoma       CHIEF COMPLIANT: Follow-up after recent reexcision  INTERVAL HISTORY: Barbara Fuentes is a 50 year old with above-mentioned history of DCIS right breast ER/PR negative who underwent reexcision on 01/20/2016 and is here today to discuss the results. She is healing very well from the recent surgery.  REVIEW OF SYSTEMS:   Constitutional: Denies fevers, chills or abnormal weight loss Eyes: Denies blurriness of vision Ears, nose, mouth, throat, and face: Denies mucositis or sore throat Respiratory: Denies cough, dyspnea or wheezes Cardiovascular: Denies palpitation, chest discomfort Gastrointestinal:  Denies nausea, heartburn or change in bowel habits Skin: Denies abnormal skin rashes Lymphatics: Denies new lymphadenopathy or easy bruising Neurological:Denies numbness, tingling or  new weaknesses Behavioral/Psych: Mood is stable, no new changes  Extremities: No lower extremity edema Breast: Recent reexcision lumpectomy All other systems were reviewed with the patient and are negative.  I have reviewed the past medical history, past surgical history, social history and family history with the patient and they are unchanged from previous note.  ALLERGIES:  is allergic to other; codeine; and procaine hcl.  MEDICATIONS:  Current Outpatient Prescriptions  Medication Sig Dispense Refill  . Calcium-Magnesium-Vitamin D (CALCIUM 500 PO) Take 1 tablet by mouth daily.    . cetirizine (ZYRTEC) 10 MG tablet Take 10 mg by mouth daily.    . Cholecalciferol (VITAMIN D PO) Take 2,000 Units by mouth.    . Cyanocobalamin (VITAMIN B 12 PO) Take by mouth.    Marland Kitchen HYDROcodone-acetaminophen (NORCO/VICODIN) 5-325 MG tablet Take 1-2 tablets by mouth every 6 (six) hours as needed. (Patient not taking: Reported on 01/17/2016) 20 tablet 0  . Multiple Vitamin (MULTIVITAMIN) tablet Take 1 tablet by mouth daily.    . vitamin C (ASCORBIC ACID) 250 MG tablet Take 250 mg by mouth daily.     No current facility-administered medications for this visit.     PHYSICAL EXAMINATION: ECOG PERFORMANCE STATUS: 1 - Symptomatic but completely ambulatory  Vitals:   01/24/16 1016  BP: (!) 108/59  Pulse: 71  Resp: 17  Temp: 98.1 F (36.7 C)   Filed Weights   01/24/16 1016  Weight: 150 lb (68 kg)    GENERAL:alert, no distress and comfortable SKIN: skin color, texture, turgor are normal, no rashes or significant lesions EYES: normal, Conjunctiva are pink and non-injected, sclera clear OROPHARYNX:no exudate, no erythema and lips, buccal mucosa, and tongue normal  NECK: supple, thyroid  normal size, non-tender, without nodularity LYMPH:  no palpable lymphadenopathy in the cervical, axillary or inguinal LUNGS: clear to auscultation and percussion with normal breathing effort HEART: regular rate & rhythm  and no murmurs and no lower extremity edema ABDOMEN:abdomen soft, non-tender and normal bowel sounds MUSCULOSKELETAL:no cyanosis of digits and no clubbing  NEURO: alert & oriented x 3 with fluent speech, no focal motor/sensory deficits EXTREMITIES: No lower extremity edema  LABORATORY DATA:  I have reviewed the data as listed   Chemistry      Component Value Date/Time   NA 139 12/25/2015 0830   K 4.2 12/25/2015 0830   CL 102 10/31/2015 0805   CO2 23 12/25/2015 0830   BUN 10.5 12/25/2015 0830   CREATININE 0.7 12/25/2015 0830      Component Value Date/Time   CALCIUM 9.5 12/25/2015 0830   ALKPHOS 32 (L) 12/25/2015 0830   AST 19 12/25/2015 0830   ALT 20 12/25/2015 0830   BILITOT 0.34 12/25/2015 0830       Lab Results  Component Value Date   WBC 7.7 12/25/2015   HGB 13.2 12/25/2015   HCT 39.4 12/25/2015   MCV 90.8 12/25/2015   PLT 323 12/25/2015   NEUTROABS 4.5 12/25/2015     ASSESSMENT & PLAN:  Breast cancer of lower-outer quadrant of right female breast ([place]) 12/19/2015: Right breast DCIS with calcifications, complex sclerosing lesion, ER 0%, PR 0%, Tis N0 stage 0 Right breast excision 01/20/2016: UDH, fibrocystic changes; right posterior excision: Benign, no evidence of carcinoma  Pathology counseling: I discussed the final pathology report of the patient provided  a copy of this report. I discussed the margins. We also discussed the final staging along with previously performed ER/PR testing. There is no further evidence of DCIS on the excision. It is likely that the DCIS was extremely small that it was removed through the biopsy itself.  Recommendation: Adjuvant radiation therapy followed by observation surveillance since the initial DCIS was ER/PR negative. Patient will follow with her primary care physician/OB/GYN annually for mammograms and breast exams.   No orders of the defined types were placed in this encounter.  The patient has a good understanding of the  overall plan. she agrees with it. she will call with any problems that may develop before the next visit here.   Rulon Eisenmenger, MD 01/24/16

## 2016-01-24 NOTE — Assessment & Plan Note (Addendum)
12/19/2015: Right breast DCIS with calcifications, complex sclerosing lesion, ER 0%, PR 0%, Tis N0 stage 0 Right breast excision 01/20/2016: UDH, fibrocystic changes; right posterior excision: Benign, no evidence of carcinoma  Pathology counseling: I discussed the final pathology report of the patient provided  a copy of this report. I discussed the margins. We also discussed the final staging along with previously performed ER/PR testing. There is no further evidence of DCIS on the excision. It is likely that the DCIS was extremely small that it was removed through the biopsy itself.  Recommendation: Adjuvant radiation therapy followed by observation surveillance since the initial DCIS was ER/PR negative. I recommended once a year follow-up with survivorship clinic.

## 2016-01-27 ENCOUNTER — Ambulatory Visit
Admission: RE | Admit: 2016-01-27 | Discharge: 2016-01-27 | Disposition: A | Discharge status: Home / Self Care | Payer: BLUE CROSS/BLUE SHIELD | Source: Ambulatory Visit | Attending: Radiation Oncology | Admitting: Radiation Oncology

## 2016-01-27 VITALS — BP 107/70 | HR 68 | Temp 98.3°F | Resp 18 | Ht 67.0 in | Wt 152.0 lb

## 2016-01-27 DIAGNOSIS — Z171 Estrogen receptor negative status [ER-]: Secondary | ICD-10-CM | POA: Insufficient documentation

## 2016-01-27 DIAGNOSIS — Z510 Encounter for antineoplastic radiation therapy: Secondary | ICD-10-CM | POA: Insufficient documentation

## 2016-01-27 DIAGNOSIS — C50511 Malignant neoplasm of lower-outer quadrant of right female breast: Secondary | ICD-10-CM | POA: Insufficient documentation

## 2016-01-27 NOTE — Progress Notes (Signed)
Radiation Oncology         [phone] ________________________________  Name: Barbara Fuentes MRN: [id number]  Date: 01/27/2016  DOB: 14-Oct-1965  Follow-Up Visit Note  Outpatient  CC: [name], MD  Nicholas Lose, MD  Diagnosis:      ICD-9-CM ICD-10-CM   1. 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 Since consultation, she underwent on 01/10/16 Breast, lumpectomy, Right revealing - DUCTAL CARCINOMA IN SITU WITH NECROSIS AND CALCIFICATIONS, at least 3 CM, with residual disease at reexcision and persistent positive margins.  On 9/25/17she underwent Breast, re-excision, right with NO EVIDENCE OF RESIDUAL CARCINOMA.  Past/Anticipated interventions by medical oncology, if any:  Dr. Lindi Adie saw in the Breast Clinic. He saw her again 01/24/16 and released her for radiotherapy.  Lymphedema issues, if any:  No  Pain issues, if any:No  SAFETY ISSUES:  Prior radiation? No  Pacemaker/ICD? No  Possible current pregnancy? No - she states she is absolutely certain  Is the patient on methotrexate? No  Menarche 44, G4,P2, BC 20 years, HRT No No family history of breast cancer             ALLERGIES:  is allergic to other; codeine; and procaine hcl.  Meds: Current Outpatient Prescriptions  Medication Sig Dispense Refill  . Calcium-Magnesium-Vitamin D (CALCIUM 500 PO) Take 1 tablet by mouth daily.    . cetirizine (ZYRTEC) 10 MG tablet Take 10 mg by mouth daily.    . Cholecalciferol (VITAMIN D PO) Take 2,000 Units by mouth.    . Cyanocobalamin (VITAMIN B 12 PO) Take by mouth.    . Multiple Vitamin (MULTIVITAMIN) tablet Take 1 tablet by mouth daily.    . vitamin C (ASCORBIC ACID) 250 MG tablet Take 250 mg by mouth daily.    Marland Kitchen HYDROcodone-acetaminophen  (NORCO/VICODIN) 5-325 MG tablet Take 1-2 tablets by mouth every 6 (six) hours as needed. (Patient not taking: Reported on 01/27/2016) 20 tablet 0   No current facility-administered medications for this encounter.     Physical Findings:  height is 5' 7"  (1.702 m) and weight is 152 lb (68.9 kg). Her oral temperature is 98.3 F (36.8 C). Her blood pressure is 107/70 and her pulse is 68. Her respiration is 18 and oxygen saturation is 100%. .     General: Alert and oriented, in no acute distress HEENT: Head is normocephalic. Extraocular movements are intact. Oropharynx is clear. Neck: Neck is supple, no palpable cervical or supraclavicular lymphadenopathy. Heart: Regular in rate and rhythm with no murmurs, rubs, or gallops. Chest: Clear to auscultation bilaterally, with no rhonchi, wheezes, or rales. Extremities: No cyanosis or edema. Lymphatics: see Neck Exam Neurologic: No obvious focalities. Speech is fluent.  Psychiatric: Judgment and insight are intact. Affect is appropriate. Breast exam reveals healing outer right breast lumpectomy scar, no palpable left breast/axillary lesions Skin: no concerning lesions MSK: no atrophy; she is ambulatory   Lab Findings: Lab Results  Component Value Date   WBC 7.7 12/25/2015   HGB 13.2 12/25/2015   HCT 39.4 12/25/2015   MCV 90.8 12/25/2015   PLT 323 12/25/2015      Radiographic Findings: Mm Breast Surgical Specimen  Result Date: 01/10/2016 CLINICAL DATA:  Recently diagnosed right breast high-grade ductal carcinoma in  situ and complex sclerosing lesion. EXAM: SPECIMEN RADIOGRAPH OF THE RIGHT BREAST COMPARISON:  Previous exam(s). FINDINGS: Status post excision of the right breast. The radioactive seed and biopsy marker clip are present, completely intact, and were marked for pathology. Multiple calcifications are also present in the specimen. IMPRESSION: Specimen radiograph of the right breast. Electronically Signed   By: Claudie Revering M.D.   On:  01/10/2016 15:33   Mm Rt Radioactive Seed Loc Mammo Guide  Result Date: 01/08/2016 CLINICAL DATA:  Patient with right breast DCIS scheduled for lumpectomy requiring preoperative radioactive seed localization. EXAM: MAMMOGRAPHIC GUIDED RADIOACTIVE SEED LOCALIZATION OF THE RIGHT BREAST COMPARISON:  Previous exam(s). FINDINGS: Patient presents for radioactive seed localization prior to lumpectomy. I met with the patient and we discussed the procedure of seed localization including benefits and alternatives. We discussed the high likelihood of a successful procedure. We discussed the risks of the procedure including infection, bleeding, tissue injury and further surgery. We discussed the low dose of radioactivity involved in the procedure. Informed, written consent was given. The usual time-out protocol was performed immediately prior to the procedure. Using mammographic guidance, sterile technique, 1% lidocaine and an I-125 radioactive seed, the biopsy clip within the outer right breast was localized using a lateral approach. The follow-up mammogram images confirm the seed in the expected location and were marked for Dr. Lucia Gaskins. Follow-up survey of the patient confirms presence of the radioactive seed. Order number of I-125 seed:  [id number]. Total activity:  0.932 millicuries  Reference Date: 12/04/2015 The patient tolerated the procedure well and was released from the [facility]. She was given instructions regarding seed removal. IMPRESSION: Radioactive seed localization right breast. No apparent complications. Electronically Signed   By: Franki Cabot M.D.   On: 01/08/2016 15:39    Impression/Plan: DCIS, right breast, ER- We discussed adjuvant radiotherapy today.  I recommend right breat radiotherapy over 4 weeks in order to reduce risk of local recurrence by half.  The risks, benefits and side effects of this treatment were discussed in detail.  She understands that radiotherapy is associated with skin  irritation and fatigue in the acute setting. Late effects can include cosmetic changes and rare injury to internal organs.   She is enthusiastic about proceeding with treatment. A consent form has been signed and placed in her chart. I also spoke with radiology, they recommend post op mammogram. This was ordered for Oct 26 or 27; sim will follow at the week of Oct 30. _____________________________________   Eppie Gibson, MD

## 2016-01-27 NOTE — Addendum Note (Signed)
Encounter addended by: Malena Edman, RN on: 01/27/2016 11:28 AM<BR>    Actions taken: Charge Capture section accepted

## 2016-01-28 ENCOUNTER — Other Ambulatory Visit: Payer: Self-pay | Admitting: Radiation Oncology

## 2016-01-28 ENCOUNTER — Telehealth: Payer: Self-pay | Admitting: *Deleted

## 2016-01-28 ENCOUNTER — Encounter: Payer: Self-pay | Admitting: Obstetrics & Gynecology

## 2016-01-28 DIAGNOSIS — Z171 Estrogen receptor negative status [ER-]: Principal | ICD-10-CM

## 2016-01-28 DIAGNOSIS — C50511 Malignant neoplasm of lower-outer quadrant of right female breast: Secondary | ICD-10-CM

## 2016-01-28 NOTE — Telephone Encounter (Signed)
CALLED PATIENT TO INFORM OF MAMMOGRAM ON 02-20-16- ARRIVAL TIME - 8:10 AM @ [place], SPOKE WITH PATIENT AND SHE  IS AWARE OF THIS APPT.

## 2016-02-20 ENCOUNTER — Ambulatory Visit
Admission: RE | Admit: 2016-02-20 | Discharge: 2016-02-20 | Disposition: A | Discharge status: Home / Self Care | Payer: BLUE CROSS/BLUE SHIELD | Source: Ambulatory Visit | Attending: Radiation Oncology | Admitting: Radiation Oncology

## 2016-02-20 DIAGNOSIS — Z171 Estrogen receptor negative status [ER-]: Principal | ICD-10-CM

## 2016-02-20 DIAGNOSIS — C50511 Malignant neoplasm of lower-outer quadrant of right female breast: Secondary | ICD-10-CM

## 2016-02-24 DIAGNOSIS — Z1379 Encounter for other screening for genetic and chromosomal anomalies: Secondary | ICD-10-CM | POA: Insufficient documentation

## 2016-02-24 NOTE — Progress Notes (Signed)
GENETIC TEST RESULT  HPI: Ms. Ruehl was previously seen in the [place] clinic due to a personal history of ER/PR- DCIS diagnosed at 62, family history of cancer, and concerns regarding a hereditary predisposition to cancer. Please refer to our prior cancer genetics clinic note from December 26, 2015 for more information regarding Ms. Paye's medical, social and family histories, and our assessment and recommendations, at the time. Ms. Krasinski recent genetic test results were disclosed to her, as were recommendations warranted by these results. These results and recommendations are discussed in more detail below.  GENETIC TEST RESULTS: At the time of Ms. Leavitt's visit on 12/26/15, we recommended she pursue genetic testing of the BRCA1/2 Ashkenazi Founder Panel with reflex to the 20-gene Breast/Ovarian Cancer Panel if negative. The BRCA1/2 Ashkenazi Founder Panel was negative, so testing proceeded via 20-gene Breast/Ovarian Cancer Panel through [facility].  The Breast/Ovarian Cancer Panel offered by [facility] Hope Pigeon, MD) includes sequencing and deletion/duplication analysis for the following 19 genes:  ATM, BARD1, BRCA1, BRCA2, BRIP1, CDH1, CHEK2, FANCC, MLH1, MSH2, MSH6, NBN, PALB2, PMS2, PTEN, RAD51C, RAD51D, TP53, and XRCC2.  This panel also includes deletion/duplication analysis (without sequencing) for one gene, EPCAM. Those results are now back, the report date for which is January 06, 2016.  Genetic testing was normal, and did not reveal a deleterious mutation in these genes.  Additionally, no variants of uncertain significance (VUSes) were found.  The test report will be scanned into EPIC and will be located under the Results Review tab in the Pathology>Molecular Pathology section.   We discussed with Ms. Delira that since the current genetic testing is not perfect, it is possible there may be a gene mutation in one of these genes that current  testing cannot detect, but that chance is small. We also discussed, that it is possible that another gene that has not yet been discovered, or that we have not yet tested, is responsible for the cancer diagnoses in the family, and it is, therefore, important to remain in touch with cancer genetics in the future so that we can continue to offer Ms. Reas the most up-to-date genetic testing.   CANCER SCREENING RECOMMENDATIONS: This result is reassuring and indicates that Ms. Hagan likely does not have an increased risk for a future cancer due to a mutation in one of these genes. This normal test also suggests that Ms. Honaker's cancer was most likely not due to an inherited predisposition associated with one of these genes.  Most cancers happen by chance and this negative test suggests that her cancer falls into this category.  We, therefore, recommended she continue to follow the cancer management and screening guidelines provided by her oncology and primary healthcare providers.   RECOMMENDATIONS FOR FAMILY MEMBERS: Women in this family might be at some increased risk of developing cancer, over the general population risk, simply due to the family history of cancer. We recommended women in this family have a yearly mammogram beginning at age 54, or 38 years younger than the earliest onset of cancer, an annual clinical breast exam, and perform monthly breast self-exams. Women in this family should also have a gynecological exam as recommended by their primary provider. All family members should have a colonoscopy by age 41.  FOLLOW-UP: Lastly, we discussed with Ms. Theroux that cancer genetics is a rapidly advancing field and it is possible that new genetic tests will be appropriate for her and/or her family members in the future. We encouraged her  to remain in contact with cancer genetics on an annual basis so we can update her personal and family histories and let her know of advances in cancer  genetics that may benefit this family.   Our contact number was provided. Ms. Prowell questions were answered to her satisfaction, and she knows she is welcome to call us at anytime with additional questions or concerns.   Jeanine Luz, MS, [facility] Certified Genetic Counselor [email or web address] Phone: [phone]

## 2016-02-26 ENCOUNTER — Ambulatory Visit
Admission: RE | Admit: 2016-02-26 | Discharge: 2016-02-26 | Disposition: A | Discharge status: Home / Self Care | Payer: BLUE CROSS/BLUE SHIELD | Source: Ambulatory Visit | Attending: Radiation Oncology | Admitting: Radiation Oncology

## 2016-02-26 DIAGNOSIS — Z171 Estrogen receptor negative status [ER-]: Principal | ICD-10-CM

## 2016-02-26 DIAGNOSIS — Z510 Encounter for antineoplastic radiation therapy: Secondary | ICD-10-CM | POA: Diagnosis not present

## 2016-02-26 DIAGNOSIS — C50511 Malignant neoplasm of lower-outer quadrant of right female breast: Secondary | ICD-10-CM

## 2016-02-26 NOTE — Progress Notes (Signed)
  Radiation Oncology         [phone] ________________________________  Name: Barbara Fuentes MRN: [id number]  Date: 02/26/2016  DOB: 11-Nov-1965  SIMULATION AND TREATMENT PLANNING NOTE    Outpatient  DIAGNOSIS:     ICD-9-CM ICD-10-CM   1. Malignant neoplasm of lower-outer quadrant of right breast of female, estrogen receptor negative ([place]) 174.5 C50.511    V86.1 Z17.1     NARRATIVE:  The patient was brought to the [facility].  Identity was confirmed.  All relevant records and images related to the planned course of therapy were reviewed.  The patient freely provided informed written consent to proceed with treatment after reviewing the details related to the planned course of therapy. The consent form was witnessed and verified by the simulation staff.    Then, the patient was set-up in a stable reproducible supine position for radiation therapy with her ipsilateral arm over her head, and her upper body secured in a custom-made Vac-lok device.  CT images were obtained.  Surface markings were placed.  The CT images were loaded into the planning software.    TREATMENT PLANNING NOTE: Treatment planning then occurred.  The radiation prescription was entered and confirmed.     A total of 3 medically necessary complex treatment devices were fabricated and supervised by me: 2 fields with MLCs for custom blocks to protect heart, and lungs;  and, a Vac-lok. MORE COMPLEX DEVICES MAY BE MADE IN DOSIMETRY FOR FIELD IN FIELD BEAMS FOR DOSE HOMOGENEITY.  I have requested : 3D Simulation  I have requested a DVH of the following structures: lungs, heart, lumpectomy cavity.    The patient will receive 40.05 Gy in 15 fractions to the right breast with 2 tangential fields.  This will be followed by a boost.  Optical Surface Tracking Plan:  Since intensity modulated radiotherapy (IMRT) and 3D conformal radiation treatment methods are predicated on accurate and precise positioning for  treatment, intrafraction motion monitoring is medically necessary to ensure accurate and safe treatment delivery. The ability to quantify intrafraction motion without excessive ionizing radiation dose can only be performed with optical surface tracking. Accordingly, surface imaging offers the opportunity to obtain 3D measurements of patient position throughout IMRT and 3D treatments without excessive radiation exposure. I am ordering optical surface tracking for this patient's upcoming course of radiotherapy.  ________________________________   Reference:  Ursula Alert, J, et al. Surface imaging-based analysis of intrafraction motion for breast radiotherapy patients.Journal of [place], n. 6, nov. 2014. ISSN [id number].  Available at: <http://www.jacmp.org/index.php/jacmp/article/view/4957>.    -----------------------------------  Eppie Gibson, MD

## 2016-03-02 DIAGNOSIS — Z510 Encounter for antineoplastic radiation therapy: Secondary | ICD-10-CM | POA: Diagnosis not present

## 2016-03-04 ENCOUNTER — Ambulatory Visit
Admission: RE | Admit: 2016-03-04 | Discharge: 2016-03-04 | Disposition: A | Discharge status: Home / Self Care | Payer: BLUE CROSS/BLUE SHIELD | Source: Ambulatory Visit | Attending: Radiation Oncology | Admitting: Radiation Oncology

## 2016-03-04 DIAGNOSIS — Z510 Encounter for antineoplastic radiation therapy: Secondary | ICD-10-CM | POA: Diagnosis not present

## 2016-03-05 ENCOUNTER — Ambulatory Visit
Admission: RE | Admit: 2016-03-05 | Discharge: 2016-03-05 | Disposition: A | Discharge status: Home / Self Care | Payer: BLUE CROSS/BLUE SHIELD | Source: Ambulatory Visit | Attending: Radiation Oncology | Admitting: Radiation Oncology

## 2016-03-05 DIAGNOSIS — Z510 Encounter for antineoplastic radiation therapy: Secondary | ICD-10-CM | POA: Diagnosis not present

## 2016-03-06 ENCOUNTER — Ambulatory Visit
Admission: RE | Admit: 2016-03-06 | Discharge: 2016-03-06 | Disposition: A | Discharge status: Home / Self Care | Payer: BLUE CROSS/BLUE SHIELD | Source: Ambulatory Visit | Attending: Radiation Oncology | Admitting: Radiation Oncology

## 2016-03-06 DIAGNOSIS — Z510 Encounter for antineoplastic radiation therapy: Secondary | ICD-10-CM | POA: Diagnosis not present

## 2016-03-09 ENCOUNTER — Ambulatory Visit
Admission: RE | Admit: 2016-03-09 | Discharge: 2016-03-09 | Disposition: A | Discharge status: Home / Self Care | Payer: BLUE CROSS/BLUE SHIELD | Source: Ambulatory Visit | Attending: Radiation Oncology | Admitting: Radiation Oncology

## 2016-03-09 ENCOUNTER — Encounter: Payer: Self-pay | Admitting: Radiation Oncology

## 2016-03-09 VITALS — BP 98/75 | HR 66 | Temp 98.0°F | Ht 67.0 in | Wt 154.4 lb

## 2016-03-09 DIAGNOSIS — C50511 Malignant neoplasm of lower-outer quadrant of right female breast: Secondary | ICD-10-CM | POA: Insufficient documentation

## 2016-03-09 DIAGNOSIS — Z510 Encounter for antineoplastic radiation therapy: Secondary | ICD-10-CM | POA: Diagnosis not present

## 2016-03-09 DIAGNOSIS — Z171 Estrogen receptor negative status [ER-]: Secondary | ICD-10-CM | POA: Diagnosis not present

## 2016-03-09 DIAGNOSIS — Z923 Personal history of irradiation: Secondary | ICD-10-CM | POA: Insufficient documentation

## 2016-03-09 DIAGNOSIS — Z5189 Encounter for other specified aftercare: Secondary | ICD-10-CM | POA: Diagnosis not present

## 2016-03-09 MED ORDER — RADIAPLEXRX EX GEL
Freq: Once | CUTANEOUS | Status: AC
  Administered 2016-03-09: 17:00:00 via TOPICAL

## 2016-03-09 MED ORDER — ALRA NON-METALLIC DEODORANT (RAD-ONC)
1.0000 "application " | Freq: Once | TOPICAL | Status: AC
  Administered 2016-03-09: 1 via TOPICAL

## 2016-03-09 NOTE — Progress Notes (Signed)
Barbara Fuentes presents for her 3rd fraction of radiation to her Right Breast. She denies pain or fatigue. She reports normal skin at her radiation site except some mild edema. She was provided education today, and she will begin using the Radiaplex cream today.   BP 98/75   Pulse 66   Temp 98 F (36.7 C)   Ht 5\' 7"  (1.702 m)   Wt 154 lb 6.4 oz (70 kg)   SpO2 100% Comment: room air  BMI 24.18 kg/m    Wt Readings from Last 3 Encounters:  03/09/16 154 lb 6.4 oz (70 kg)  01/27/16 152 lb (68.9 kg)  01/24/16 150 lb (68 kg)

## 2016-03-09 NOTE — Progress Notes (Signed)
   Weekly Management Note:  Outpatient    ICD-9-CM ICD-10-CM   1. Malignant neoplasm of lower-outer quadrant of right breast of female, estrogen receptor negative ([facility]) 174.5 C50.511 hyaluronate sodium (RADIAPLEXRX) gel   [id number] non-metallic deodorant (ALRA) 1 application    Current Dose:  8.01 Gy  Projected Dose: 50.05 Gy   Narrative:  The patient presents for routine under treatment assessment.  CBCT/MVCT images/Port film x-rays were reviewed.  The chart was checked. Doing well  Physical Findings:  height is 5\' 7"  (1.702 m) and weight is 154 lb 6.4 oz (70 kg). Her temperature is 98 F (36.7 C). Her blood pressure is 98/75 and her pulse is 66. Her oxygen saturation is 100%.   Wt Readings from Last 3 Encounters:  03/09/16 154 lb 6.4 oz (70 kg)  01/27/16 152 lb (68.9 kg)  01/24/16 150 lb (68 kg)   Bluish appearance of right nipple, no skin changes from RT  Impression:  The patient is tolerating radiotherapy.  Plan:  Continue radiotherapy as planned. Patient instructed to apply Radiplex to intact skin in treatment fields.      ________________________________   Eppie Gibson, M.D.

## 2016-03-09 NOTE — Progress Notes (Signed)

## 2016-03-10 ENCOUNTER — Ambulatory Visit
Admission: RE | Admit: 2016-03-10 | Discharge: 2016-03-10 | Disposition: A | Discharge status: Home / Self Care | Payer: BLUE CROSS/BLUE SHIELD | Source: Ambulatory Visit | Attending: Radiation Oncology | Admitting: Radiation Oncology

## 2016-03-10 DIAGNOSIS — Z510 Encounter for antineoplastic radiation therapy: Secondary | ICD-10-CM | POA: Diagnosis not present

## 2016-03-11 ENCOUNTER — Ambulatory Visit
Admission: RE | Admit: 2016-03-11 | Discharge: 2016-03-11 | Disposition: A | Discharge status: Home / Self Care | Payer: BLUE CROSS/BLUE SHIELD | Source: Ambulatory Visit | Attending: Radiation Oncology | Admitting: Radiation Oncology

## 2016-03-11 DIAGNOSIS — Z510 Encounter for antineoplastic radiation therapy: Secondary | ICD-10-CM | POA: Diagnosis not present

## 2016-03-12 ENCOUNTER — Ambulatory Visit
Admission: RE | Admit: 2016-03-12 | Discharge: 2016-03-12 | Disposition: A | Discharge status: Home / Self Care | Payer: BLUE CROSS/BLUE SHIELD | Source: Ambulatory Visit | Attending: Radiation Oncology | Admitting: Radiation Oncology

## 2016-03-12 ENCOUNTER — Telehealth: Payer: Self-pay | Admitting: *Deleted

## 2016-03-12 DIAGNOSIS — Z510 Encounter for antineoplastic radiation therapy: Secondary | ICD-10-CM | POA: Diagnosis not present

## 2016-03-12 NOTE — Telephone Encounter (Signed)
  Oncology Nurse Navigator Documentation  Navigator Location: [facility] (03/12/16 1400)   )Navigator Encounter Type: Telephone (03/12/16 1400) Telephone: [place] Call (03/12/16 1400)                   Patient Visit Type: [id number] (03/12/16 1400) Treatment Phase: First Radiation Tx (03/12/16 1400)                            Time Spent with Patient: 15 (03/12/16 1400)

## 2016-03-13 ENCOUNTER — Ambulatory Visit
Admission: RE | Admit: 2016-03-13 | Discharge: 2016-03-13 | Disposition: A | Discharge status: Home / Self Care | Payer: BLUE CROSS/BLUE SHIELD | Source: Ambulatory Visit | Attending: Radiation Oncology | Admitting: Radiation Oncology

## 2016-03-13 DIAGNOSIS — Z510 Encounter for antineoplastic radiation therapy: Secondary | ICD-10-CM | POA: Diagnosis not present

## 2016-03-15 ENCOUNTER — Ambulatory Visit
Admission: RE | Admit: 2016-03-15 | Discharge: 2016-03-15 | Disposition: A | Discharge status: Home / Self Care | Payer: BLUE CROSS/BLUE SHIELD | Source: Ambulatory Visit | Attending: Radiation Oncology | Admitting: Radiation Oncology

## 2016-03-15 DIAGNOSIS — Z510 Encounter for antineoplastic radiation therapy: Secondary | ICD-10-CM | POA: Diagnosis not present

## 2016-03-16 ENCOUNTER — Ambulatory Visit
Admission: RE | Admit: 2016-03-16 | Discharge: 2016-03-16 | Disposition: A | Discharge status: Home / Self Care | Payer: BLUE CROSS/BLUE SHIELD | Source: Ambulatory Visit | Attending: Radiation Oncology | Admitting: Radiation Oncology

## 2016-03-16 DIAGNOSIS — Z510 Encounter for antineoplastic radiation therapy: Secondary | ICD-10-CM | POA: Diagnosis not present

## 2016-03-16 DIAGNOSIS — Z171 Estrogen receptor negative status [ER-]: Principal | ICD-10-CM

## 2016-03-16 DIAGNOSIS — C50511 Malignant neoplasm of lower-outer quadrant of right female breast: Secondary | ICD-10-CM

## 2016-03-16 NOTE — Progress Notes (Signed)
Ms. Hnat presents for her 9th fraction of radiation to her Right Breast. She reports a "deep tissue ache" in her Right Breast with movement. She questions if she can take ibuprofen for this pain. She has some mild fatigue. Her Right Breast is slightly red, she is using radiaplex cream twice daily.   BP 117/78   Pulse 71   Temp 98.5 F (36.9 C)   Ht 5\' 7"  (1.702 m)   Wt 153 lb 3.2 oz (69.5 kg)   SpO2 100% Comment: room air  BMI 23.99 kg/m    Wt Readings from Last 3 Encounters:  03/09/16 154 lb 6.4 oz (70 kg)  03/16/16 153 lb 3.2 oz (69.5 kg)  01/27/16 152 lb (68.9 kg)

## 2016-03-16 NOTE — Progress Notes (Addendum)
   Weekly Management Note:  Outpatient    ICD-9-CM ICD-10-CM   1. Malignant neoplasm of lower-outer quadrant of right breast of female, estrogen receptor negative ([facility]) 174.5 C50.511    V86.1 Z17.1     Current Dose:  24.03 Gy  Projected Dose: 50.05 Gy   Narrative:  The patient presents for routine under treatment assessment.  CBCT/MVCT images/Port film x-rays were reviewed.  The chart was checked. Doing well with some soreness of right breast  Physical Findings:  vitals were not taken for this visit.  Wt Readings from Last 3 Encounters:  03/09/16 154 lb 6.4 oz (70 kg)  03/16/16 153 lb 3.2 oz (69.5 kg)  01/27/16 152 lb (68.9 kg)   Slight erythema, right breast  Impression:  The patient is tolerating radiotherapy.  Plan:  Continue radiotherapy as planned. Patient instructed to apply Radiplex to intact skin in treatment fields. May take ibuprofen for pain/soreness, take with food.    ________________________________   Eppie Gibson, M.D.

## 2016-03-17 ENCOUNTER — Ambulatory Visit
Admission: RE | Admit: 2016-03-17 | Discharge: 2016-03-17 | Disposition: A | Discharge status: Home / Self Care | Payer: BLUE CROSS/BLUE SHIELD | Source: Ambulatory Visit | Attending: Radiation Oncology | Admitting: Radiation Oncology

## 2016-03-17 DIAGNOSIS — Z510 Encounter for antineoplastic radiation therapy: Secondary | ICD-10-CM | POA: Diagnosis not present

## 2016-03-18 ENCOUNTER — Ambulatory Visit
Admission: RE | Admit: 2016-03-18 | Discharge: 2016-03-18 | Disposition: A | Discharge status: Home / Self Care | Payer: BLUE CROSS/BLUE SHIELD | Source: Ambulatory Visit | Attending: Radiation Oncology | Admitting: Radiation Oncology

## 2016-03-18 DIAGNOSIS — Z510 Encounter for antineoplastic radiation therapy: Secondary | ICD-10-CM | POA: Diagnosis not present

## 2016-03-20 ENCOUNTER — Ambulatory Visit: Payer: BLUE CROSS/BLUE SHIELD

## 2016-03-23 ENCOUNTER — Encounter: Payer: Self-pay | Admitting: Radiation Oncology

## 2016-03-23 ENCOUNTER — Ambulatory Visit
Admission: RE | Admit: 2016-03-23 | Discharge: 2016-03-23 | Disposition: A | Discharge status: Home / Self Care | Payer: BLUE CROSS/BLUE SHIELD | Source: Ambulatory Visit | Attending: Radiation Oncology | Admitting: Radiation Oncology

## 2016-03-23 VITALS — BP 114/68 | HR 63 | Temp 98.0°F | Ht 67.0 in | Wt 153.8 lb

## 2016-03-23 DIAGNOSIS — C50511 Malignant neoplasm of lower-outer quadrant of right female breast: Secondary | ICD-10-CM

## 2016-03-23 DIAGNOSIS — Z171 Estrogen receptor negative status [ER-]: Principal | ICD-10-CM

## 2016-03-23 DIAGNOSIS — Z510 Encounter for antineoplastic radiation therapy: Secondary | ICD-10-CM | POA: Diagnosis not present

## 2016-03-23 NOTE — Progress Notes (Signed)
Ms. Haymes is here for her 12th fraction of radiation to her Right Breast. She denies pain. She does have some mild fatigue. Her Right Breast is hyperpigmented. She reports burning and itching to her Left Breast, with increased tenderness to her nipple area. She has dermatitis visible to the upper inner quadrant of her Right Breast, and there is a small open area to the upper inner quadrant. She reports itching to this area. She has been using Radiaplex twice daily.  She plans to begin using hydrocortisone and neosporin to her Right Breast today.   BP 114/68   Pulse 63   Temp 98 F (36.7 C)   Ht 5\' 7"  (1.702 m)   Wt 153 lb 12.8 oz (69.8 kg)   SpO2 100% Comment: room air  BMI 24.09 kg/m    Wt Readings from Last 3 Encounters:  03/23/16 153 lb 12.8 oz (69.8 kg)  03/16/16 153 lb 3.2 oz (69.5 kg)  03/09/16 154 lb 6.4 oz (70 kg)

## 2016-03-23 NOTE — Progress Notes (Signed)
   Weekly Management Note:  Outpatient    ICD-9-CM ICD-10-CM   1. Malignant neoplasm of lower-outer quadrant of right breast of female, estrogen receptor negative ([facility]) 174.5 C50.511    V86.1 Z17.1     Current Dose:  32.04 Gy  Projected Dose: 50.05 Gy   Narrative:  The patient presents for routine under treatment assessment.  CBCT/MVCT images/Port film x-rays were reviewed.  The chart was checked. Doing well with some itching and continued soreness of right breast  Physical Findings:  height is 5\' 7"  (1.702 m) and weight is 153 lb 12.8 oz (69.8 kg). Her temperature is 98 F (36.7 C). Her blood pressure is 114/68 and her pulse is 63. Her oxygen saturation is 100%.   Wt Readings from Last 3 Encounters:  03/23/16 153 lb 12.8 oz (69.8 kg)  03/16/16 153 lb 3.2 oz (69.5 kg)  03/09/16 154 lb 6.4 oz (70 kg)   moderate erythema, right breast with punctate bleeding in UIQ where she scratched  Impression:  The patient is tolerating radiotherapy.  Plan:  Continue radiotherapy as planned. Patient instructed to apply Radiplex to intact skin in treatment fields. May take ibuprofen for pain/soreness, take with food. Do not scratch. Apply hydrocortisone 1% cream in itching areas     ________________________________   Eppie Gibson, M.D.

## 2016-03-24 ENCOUNTER — Ambulatory Visit
Admission: RE | Admit: 2016-03-24 | Discharge: 2016-03-24 | Disposition: A | Discharge status: Home / Self Care | Payer: BLUE CROSS/BLUE SHIELD | Source: Ambulatory Visit | Attending: Radiation Oncology | Admitting: Radiation Oncology

## 2016-03-24 DIAGNOSIS — Z510 Encounter for antineoplastic radiation therapy: Secondary | ICD-10-CM | POA: Diagnosis not present

## 2016-03-25 ENCOUNTER — Encounter: Payer: Self-pay | Admitting: Radiation Oncology

## 2016-03-25 ENCOUNTER — Ambulatory Visit
Admission: RE | Admit: 2016-03-25 | Discharge: 2016-03-25 | Disposition: A | Discharge status: Home / Self Care | Payer: BLUE CROSS/BLUE SHIELD | Source: Ambulatory Visit | Attending: Radiation Oncology | Admitting: Radiation Oncology

## 2016-03-25 DIAGNOSIS — Z510 Encounter for antineoplastic radiation therapy: Secondary | ICD-10-CM | POA: Diagnosis not present

## 2016-03-25 NOTE — Progress Notes (Signed)
[employer] trader Note OUTPATIENT  Diagnosis: Breast Cancer  Malignant neoplasm of lower-outer quadrant of right breast of female, estrogen receptor negative ([facility]) 174.5 C50.511 hyaluronate sodium (RADIAPLEXRX) gel    The patient's CT images from her free-breathing simulation were reviewed to plan her boost treatment to her right breast  lumpectomy cavity.  The boost to the lumpectomy cavity will be delivered with 2 photon fields using MLCs for custom blocks again heart and lungs, with 15 MV photon energy.  This constitutes 2 complex treatment devices. Isodose plan was reviewed and approved. 10 Gy in 5 fractions prescribed.  -----------------------------------  Eppie Gibson, MD

## 2016-03-26 ENCOUNTER — Ambulatory Visit
Admission: RE | Admit: 2016-03-26 | Discharge: 2016-03-26 | Disposition: A | Discharge status: Home / Self Care | Payer: BLUE CROSS/BLUE SHIELD | Source: Ambulatory Visit | Attending: Radiation Oncology | Admitting: Radiation Oncology

## 2016-03-26 DIAGNOSIS — Z510 Encounter for antineoplastic radiation therapy: Secondary | ICD-10-CM | POA: Diagnosis not present

## 2016-03-27 ENCOUNTER — Ambulatory Visit
Admission: RE | Admit: 2016-03-27 | Discharge: 2016-03-27 | Disposition: A | Discharge status: Home / Self Care | Payer: BLUE CROSS/BLUE SHIELD | Source: Ambulatory Visit | Attending: Radiation Oncology | Admitting: Radiation Oncology

## 2016-03-27 DIAGNOSIS — Z510 Encounter for antineoplastic radiation therapy: Secondary | ICD-10-CM | POA: Diagnosis not present

## 2016-03-30 ENCOUNTER — Ambulatory Visit
Admission: RE | Admit: 2016-03-30 | Discharge: 2016-03-30 | Disposition: A | Discharge status: Home / Self Care | Payer: BLUE CROSS/BLUE SHIELD | Source: Ambulatory Visit | Attending: Radiation Oncology | Admitting: Radiation Oncology

## 2016-03-30 ENCOUNTER — Encounter: Payer: Self-pay | Admitting: Radiation Oncology

## 2016-03-30 VITALS — BP 95/81 | HR 69 | Temp 98.0°F | Ht 67.0 in | Wt 157.8 lb

## 2016-03-30 DIAGNOSIS — Z171 Estrogen receptor negative status [ER-]: Secondary | ICD-10-CM | POA: Insufficient documentation

## 2016-03-30 DIAGNOSIS — Z510 Encounter for antineoplastic radiation therapy: Secondary | ICD-10-CM | POA: Diagnosis not present

## 2016-03-30 DIAGNOSIS — C50511 Malignant neoplasm of lower-outer quadrant of right female breast: Secondary | ICD-10-CM | POA: Insufficient documentation

## 2016-03-30 MED ORDER — RADIAPLEXRX EX GEL
Freq: Once | CUTANEOUS | Status: AC
  Administered 2016-03-30: 11:00:00 via TOPICAL

## 2016-03-30 NOTE — Addendum Note (Signed)
Encounter addended by: Ernst Spell, RN on: 03/30/2016 10:57 AM<BR>    Actions taken: MAR administration accepted

## 2016-03-30 NOTE — Progress Notes (Signed)
Ms. Prins presents for her 17th fraction to her Right Breast. She reports soreness to her Right Breast. She has some fatigue. Her Right Breast is red, with dermatitis noted in the upper inner quadrant. She reports itching to this area. She is using hydrocortisone cream and neosporin to the upper inner quadrant of her Right Breast. She was provided with another tube of radiaplex today and a follow up appointment for one month.   BP 95/81   Pulse 69   Temp 98 F (36.7 C)   Ht 5\' 7"  (1.702 m)   Wt 157 lb 12.8 oz (71.6 kg)   SpO2 100% Comment: room air  BMI 24.71 kg/m    Wt Readings from Last 3 Encounters:  03/30/16 157 lb 12.8 oz (71.6 kg)  03/23/16 153 lb 12.8 oz (69.8 kg)  03/16/16 153 lb 3.2 oz (69.5 kg)

## 2016-03-30 NOTE — Progress Notes (Signed)
   Weekly Management Note:  Outpatient    ICD-9-CM ICD-10-CM   1. Malignant neoplasm of lower-outer quadrant of right breast of female, estrogen receptor negative ([facility]) 174.5 C50.511 hyaluronate sodium (RADIAPLEXRX) gel   V86.1 Z17.1     Current Dose:  44.05 Gy  Projected Dose: 50.05 Gy   Narrative:  The patient presents for routine under treatment assessment.  CBCT/MVCT images/Port film x-rays were reviewed.  The chart was checked. Very sore nipple, right  Physical Findings:  height is 5\' 7"  (1.702 m) and weight is 157 lb 12.8 oz (71.6 kg). Her temperature is 98 F (36.7 C). Her blood pressure is 95/81 and her pulse is 69. Her oxygen saturation is 100%.   Wt Readings from Last 3 Encounters:  03/30/16 157 lb 12.8 oz (71.6 kg)  03/23/16 153 lb 12.8 oz (69.8 kg)  03/16/16 153 lb 3.2 oz (69.5 kg)   moderate erythema, right breast with punctate bleeding in UIQ where she scratched. Dry skin of right breast with peeling (dry) over nipple  Impression:  The patient is tolerating radiotherapy.  Plan:  Continue radiotherapy as planned. Patient instructed to apply Radiplex to intact skin in treatment fields. May take ibuprofen for pain/soreness, take with food. Do not scratch. Apply hydrocortisone 1% cream in itching areas. Neosporin in peeling areas.  Hydrogel pads given for sore nipple.  F/u in 16mo    ________________________________   Eppie Gibson, M.D.

## 2016-03-31 ENCOUNTER — Telehealth: Payer: Self-pay | Admitting: Hematology and Oncology

## 2016-03-31 ENCOUNTER — Ambulatory Visit
Admission: RE | Admit: 2016-03-31 | Discharge: 2016-03-31 | Disposition: A | Discharge status: Home / Self Care | Payer: BLUE CROSS/BLUE SHIELD | Source: Ambulatory Visit | Attending: Radiation Oncology | Admitting: Radiation Oncology

## 2016-03-31 DIAGNOSIS — Z510 Encounter for antineoplastic radiation therapy: Secondary | ICD-10-CM | POA: Diagnosis not present

## 2016-03-31 NOTE — Telephone Encounter (Signed)
FAXED OFFICE NOTES TO [facility] RELEASE ID [id number]

## 2016-04-01 ENCOUNTER — Ambulatory Visit
Admission: RE | Admit: 2016-04-01 | Discharge: 2016-04-01 | Disposition: A | Discharge status: Home / Self Care | Payer: BLUE CROSS/BLUE SHIELD | Source: Ambulatory Visit | Attending: Radiation Oncology | Admitting: Radiation Oncology

## 2016-04-01 DIAGNOSIS — Z510 Encounter for antineoplastic radiation therapy: Secondary | ICD-10-CM | POA: Diagnosis not present

## 2016-04-02 ENCOUNTER — Encounter: Payer: Self-pay | Admitting: Radiation Oncology

## 2016-04-02 ENCOUNTER — Ambulatory Visit
Admission: RE | Admit: 2016-04-02 | Discharge: 2016-04-02 | Disposition: A | Discharge status: Home / Self Care | Payer: BLUE CROSS/BLUE SHIELD | Source: Ambulatory Visit | Attending: Radiation Oncology | Admitting: Radiation Oncology

## 2016-04-02 ENCOUNTER — Telehealth: Payer: Self-pay | Admitting: *Deleted

## 2016-04-02 DIAGNOSIS — Z510 Encounter for antineoplastic radiation therapy: Secondary | ICD-10-CM | POA: Diagnosis not present

## 2016-04-02 NOTE — Telephone Encounter (Signed)
  Oncology Nurse Navigator Documentation  Navigator Location: [facility] (04/02/16 1400)   )Navigator Encounter Type: Telephone (04/02/16 1400) Telephone: [place] Call (04/02/16 1400)     Surgery Date: 01/10/16 (04/02/16 1400) Genetic Counseling Date: 12/26/15 (04/02/16 1400) Genetic Counseling Type: Urgent (04/02/16 1400) Plastic Surgery Consult Date:  (None) (04/02/16 1400) Multidisiplinary Clinic Date: 12/25/15 (04/02/16 1400) Multidisiplinary Clinic Type: Breast (04/02/16 1400) Treatment Initiated Date: 01/10/16 (04/02/16 1400) Patient Visit Type: [id number] (04/02/16 1400) Treatment Phase: Final Radiation Tx (04/02/16 1400) Barriers/Navigation Needs: No barriers at this time;No Questions;No Needs (04/02/16 1400)   Interventions: None required (04/02/16 1400)            Acuity: Level 1 (04/02/16 1400)         Time Spent with Patient: 15 (04/02/16 1400)

## 2016-04-10 NOTE — Progress Notes (Signed)
  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 Malignant neoplasm of lower-outer quadrant of right breast of female, estrogen receptor negative (HCC) 174.5 C50.511 hyaluronate sodium (RADIAPLEXRX) gel   V86.1 Z17.1     Indication for treatment:  Curative       Radiation treatment dates:   03/05/2016 to 04/02/2016  Site/dose:    1. The Right breast was treated to 40.05 Gy in 15 fractions at 2.67 Gy per fraction.  2. The Right breast was boosted to 10 Gy in 5 fractions at 2 Gy per fraction.   Beams/energy:    1. 3D // 6X 2. Isodose plan // 15X  Narrative: The patient tolerated radiation treatment relatively well.   She reports very sore right nipple. The patient developed moderate erythema in the treatment field. The right breast was noted to have punctate bleeding in UIQ where she scratched. Dry skin of right breast with peeling (dry) over nipple.   Plan: The patient has completed radiation treatment. The patient will return to radiation oncology clinic for routine followup in one month. Hydrogel pads were given to the patient for sore nipple and she was instructed on the use of Radiaplex and hydrocortisone 1% cream. I advised them to call or return sooner if they have any questions or concerns related to their recovery or treatment.  -----------------------------------  Eppie Gibson, MD   This document serves as a record of services personally performed by Eppie Gibson, MD. It was created on her behalf by Arlyce Harman, a trained medical scribe. The creation of this record is based on the scribe's personal observations and the provider's statements to them. This document has been checked and approved by the attending  provider.

## 2016-05-01 ENCOUNTER — Encounter: Payer: Self-pay | Admitting: Radiation Oncology

## 2016-05-04 NOTE — Assessment & Plan Note (Signed)
12/19/2015: Right breast DCIS with calcifications, complex sclerosing lesion, ER 0%, PR 0%, Tis N0 stage 0 Right breast excision 01/20/2016: UDH, fibrocystic changes; right posterior excision: Benign, no evidence of carcinoma Adj XRT 03/05/16 to 04/02/16  Plan: observation surveillance since the initial DCIS was ER/PR negative. Patient will follow with her primary care physician/OB/GYN annually for mammograms and breast exams.  RTC as needed

## 2016-05-05 ENCOUNTER — Ambulatory Visit (HOSPITAL_BASED_OUTPATIENT_CLINIC_OR_DEPARTMENT_OTHER): Payer: BLUE CROSS/BLUE SHIELD | Admitting: Hematology and Oncology

## 2016-05-05 ENCOUNTER — Encounter: Payer: Self-pay | Admitting: Hematology and Oncology

## 2016-05-05 DIAGNOSIS — D0511 Intraductal carcinoma in situ of right breast: Secondary | ICD-10-CM | POA: Diagnosis not present

## 2016-05-05 DIAGNOSIS — Z171 Estrogen receptor negative status [ER-]: Secondary | ICD-10-CM

## 2016-05-05 DIAGNOSIS — C50511 Malignant neoplasm of lower-outer quadrant of right female breast: Secondary | ICD-10-CM

## 2016-05-05 NOTE — Progress Notes (Signed)
Patient Care Team: [name], MD as PCP - General (Family Medicine) Brien Few, MD as Consulting Physician (Obstetrics and Gynecology) Willow Ora as Consulting Physician (Optometry) Nani Ravens, MD as Consulting Physician (Dermatology) [name], MD as Consulting Physician (General Surgery) Nicholas Lose, MD as Consulting Physician (Hematology and Oncology) Eppie Gibson, MD as Attending Physician (Radiation Oncology)  DIAGNOSIS:  Encounter Diagnosis  Name Primary?  . Malignant neoplasm of lower-outer quadrant of right breast of female, estrogen receptor negative ([place])     SUMMARY OF ONCOLOGIC HISTORY:   Breast cancer of lower-outer quadrant of right female breast ([place])   12/18/2015 Mammogram    Right breast: 2 cm group of suspicious calcifications within the outer right breast      12/19/2015 Initial Diagnosis    Right breast DCIS with calcifications, complex sclerosing lesion, ER 0%, PR 0%, Tis N0 stage 0      01/20/2016 Surgery    Right breast excision: UDH, fibrocystic changes; right posterior excision: Benign, no evidence of carcinoma      03/05/2016 - 04/02/2016 Radiation Therapy    Adj XRT       CHIEF COMPLIANT: Follow-up of adjuvant radiation therapy  INTERVAL HISTORY: Barbara Fuentes is a 23 year with above-mentioned history of right breast DCIS who underwent lumpectomy followed by adjuvant radiation. She is here to discuss a complete adjuvant treatment plan. She has recovered very well from the surgery. She reports that she was very happy and content with the care that she received. She felt a sense of calm and peace since she arrived at our office.  REVIEW OF SYSTEMS:   Constitutional: Denies fevers, chills or abnormal weight loss Eyes: Denies blurriness of vision Ears, nose, mouth, throat, and face: Denies mucositis or sore throat Respiratory: Denies cough, dyspnea or wheezes Cardiovascular: Denies palpitation, chest  discomfort Gastrointestinal:  Denies nausea, heartburn or change in bowel habits Skin: Denies abnormal skin rashes Lymphatics: Denies new lymphadenopathy or easy bruising Neurological:Denies numbness, tingling or new weaknesses Behavioral/Psych: Mood is stable, no new changes  Extremities: No lower extremity edema Breast:  denies any pain or lumps or nodules in either breasts All other systems were reviewed with the patient and are negative.  I have reviewed the past medical history, past surgical history, social history and family history with the patient and they are unchanged from previous note.  ALLERGIES:  is allergic to other; codeine; and procaine hcl.  MEDICATIONS:  Current Outpatient Prescriptions  Medication Sig Dispense Refill  . Calcium-Magnesium-Vitamin D (CALCIUM 500 PO) Take 1 tablet by mouth daily.    . cetirizine (ZYRTEC) 10 MG tablet Take 10 mg by mouth daily.    . Cholecalciferol (VITAMIN D PO) Take 2,000 Units by mouth.    . Cyanocobalamin (VITAMIN B 12 PO) Take by mouth.    Marland Kitchen HYDROcodone-acetaminophen (NORCO/VICODIN) 5-325 MG tablet Take 1-2 tablets by mouth every 6 (six) hours as needed. (Patient not taking: Reported on 03/30/2016) 20 tablet 0  . Multiple Vitamin (MULTIVITAMIN) tablet Take 1 tablet by mouth daily.    . vitamin C (ASCORBIC ACID) 250 MG tablet Take 250 mg by mouth daily.     No current facility-administered medications for this visit.     PHYSICAL EXAMINATION: ECOG PERFORMANCE STATUS: 1 - Symptomatic but completely ambulatory  Vitals:   05/05/16 1016  BP: 113/68  Pulse: 67  Resp: 17  Temp: 97.7 F (36.5 C)   Filed Weights   05/05/16 1016  Weight: 160 lb  3.2 oz (72.7 kg)    GENERAL:alert, no distress and comfortable SKIN: skin color, texture, turgor are normal, no rashes or significant lesions EYES: normal, Conjunctiva are pink and non-injected, sclera clear OROPHARYNX:no exudate, no erythema and lips, buccal mucosa, and tongue normal   NECK: supple, thyroid normal size, non-tender, without nodularity LYMPH:  no palpable lymphadenopathy in the cervical, axillary or inguinal LUNGS: clear to auscultation and percussion with normal breathing effort HEART: regular rate & rhythm and no murmurs and no lower extremity edema ABDOMEN:abdomen soft, non-tender and normal bowel sounds MUSCULOSKELETAL:no cyanosis of digits and no clubbing  NEURO: alert & oriented x 3 with fluent speech, no focal motor/sensory deficits EXTREMITIES: No lower extremity edema  LABORATORY DATA:  I have reviewed the data as listed   Chemistry      Component Value Date/Time   NA 139 12/25/2015 0830   K 4.2 12/25/2015 0830   CL 102 10/31/2015 0805   CO2 23 12/25/2015 0830   BUN 10.5 12/25/2015 0830   CREATININE 0.7 12/25/2015 0830      Component Value Date/Time   CALCIUM 9.5 12/25/2015 0830   ALKPHOS 32 (L) 12/25/2015 0830   AST 19 12/25/2015 0830   ALT 20 12/25/2015 0830   BILITOT 0.34 12/25/2015 0830       Lab Results  Component Value Date   WBC 7.7 12/25/2015   HGB 13.2 12/25/2015   HCT 39.4 12/25/2015   MCV 90.8 12/25/2015   PLT 323 12/25/2015   NEUTROABS 4.5 12/25/2015    ASSESSMENT & PLAN:  Breast cancer of lower-outer quadrant of right female breast ([place]) 12/19/2015: Right breast DCIS with calcifications, complex sclerosing lesion, ER 0%, PR 0%, Tis N0 stage 0 Right breast excision 01/20/2016: UDH, fibrocystic changes; right posterior excision: Benign, no evidence of carcinoma Adj XRT 03/05/16 to 04/02/16  Plan: observation surveillance since the initial DCIS was ER/PR negative. Patient will follow with her primary care physician/OB/GYN annually for mammograms and breast exams.  RTC as needed  I spent 15 minutes talking to the patient of which more than half was spent in counseling and coordination of care.  No orders of the defined types were placed in this encounter.  The patient has a good understanding of the overall  plan. she agrees with it. she will call with any problems that may develop before the next visit here.   Rulon Eisenmenger, MD 05/05/16

## 2016-05-08 ENCOUNTER — Ambulatory Visit
Admission: RE | Admit: 2016-05-08 | Payer: BLUE CROSS/BLUE SHIELD | Source: Ambulatory Visit | Admitting: Radiation Oncology

## 2016-05-08 HISTORY — DX: Personal history of irradiation: Z92.3

## 2016-05-15 ENCOUNTER — Ambulatory Visit
Admission: RE | Admit: 2016-05-15 | Payer: BLUE CROSS/BLUE SHIELD | Source: Ambulatory Visit | Admitting: Radiation Oncology

## 2016-05-22 ENCOUNTER — Encounter: Payer: Self-pay | Admitting: Radiation Oncology

## 2016-05-22 ENCOUNTER — Ambulatory Visit
Admission: RE | Admit: 2016-05-22 | Discharge: 2016-05-22 | Disposition: A | Discharge status: Home / Self Care | Payer: BLUE CROSS/BLUE SHIELD | Source: Ambulatory Visit | Attending: Radiation Oncology | Admitting: Radiation Oncology

## 2016-05-22 DIAGNOSIS — D0511 Intraductal carcinoma in situ of right breast: Secondary | ICD-10-CM | POA: Diagnosis not present

## 2016-05-22 DIAGNOSIS — Z888 Allergy status to other drugs, medicaments and biological substances status: Secondary | ICD-10-CM | POA: Insufficient documentation

## 2016-05-22 DIAGNOSIS — Z923 Personal history of irradiation: Secondary | ICD-10-CM | POA: Insufficient documentation

## 2016-05-22 DIAGNOSIS — Z171 Estrogen receptor negative status [ER-]: Secondary | ICD-10-CM | POA: Insufficient documentation

## 2016-05-22 DIAGNOSIS — C50511 Malignant neoplasm of lower-outer quadrant of right female breast: Secondary | ICD-10-CM | POA: Diagnosis present

## 2016-05-22 DIAGNOSIS — Z885 Allergy status to narcotic agent status: Secondary | ICD-10-CM | POA: Insufficient documentation

## 2016-05-22 NOTE — Progress Notes (Signed)
Radiation Oncology         (336) [phone] ________________________________  Name: Barbara Fuentes MRN: [id number]  Date: 05/22/2016  DOB: 1965-11-14  Follow-Up Visit Note  Outpatient  CC: Barbara Homans, MD  Barbara Overall, MD  Diagnosis and Prior Radiotherapy:    ICD-9-CM ICD-10-CM   1. Malignant neoplasm of lower-outer quadrant of right breast of female, estrogen receptor negative ([place]) 174.5 C50.511 Amb Referral to Survivorship Long term   V86.1 Z17.1     Stage 0 TisN0M0 Right Breast Ductal Carcinoma In Situ ER/PR Negative, High Grade 03/05/16 - 04/02/16 : Right Breast treated to 40.05 Gy, then Boosted an additional 10 Gy  CHIEF COMPLAINT: Here for follow-up and surveillance of Right Breast cancer  Narrative:  The patient returns today for routine follow-up of radiation completed on 04/02/16.  On review of systems, the patient denies pain. She reports her fatigue has improved. She reports her right nipple is lighter than it was before. She reports she is using her regular lotion to her right breast which contains Vitamin E oil.                    ALLERGIES:  is allergic to other; codeine; and procaine hcl.  Meds: Current Outpatient Prescriptions  Medication Sig Dispense Refill  . Calcium-Magnesium-Vitamin D (CALCIUM 500 PO) Take 1 tablet by mouth daily.    . Cholecalciferol (VITAMIN D PO) Take 2,000 Units by mouth.    . Cyanocobalamin (VITAMIN B 12 PO) Take by mouth.    . Multiple Vitamin (MULTIVITAMIN) tablet Take 1 tablet by mouth daily.    . vitamin C (ASCORBIC ACID) 250 MG tablet Take 250 mg by mouth daily.     No current facility-administered medications for this encounter.     Physical Findings: The patient is in no acute distress. Patient is alert and oriented.  height is 5\' 7"  (1.702 m) and weight is 158 lb 9.6 oz (71.9 kg). Her temperature is 98.2 F (36.8 C). Her blood pressure is 117/65 and her pulse is 71. Her oxygen saturation is 100%.   On examination of the  right breast, there is some superficial nipple dryness. The rest of the skin has healed very well with minimal redness.   Lab Findings: Lab Results  Component Value Date   WBC 7.7 12/25/2015   HGB 13.2 12/25/2015   HCT 39.4 12/25/2015   MCV 90.8 12/25/2015   PLT 323 12/25/2015    Radiographic Findings: No results found.  Impression/Plan: Stage 0 TisN0M0 Right Breast Ductal Carcinoma In Situ ER/PR Negative, High Grade, post RT. Healing well, good energy.  I advised the patient she could use some Aquaphor to the nipple area to relieve some of her skin dryness.  I discussed the Survivorship program with the patient, and will have them see her in early Sept for follow up, post mammogram. I encouraged the patient to continue receiving yearly mammograms. She will continue to follow with Dr. Lindi Adie PRN. The patient will follow up with me as needed. She is pleased with this plan.  I spent 15 minutes face to face with the patient and more than 50% of that time was spent in counseling and/or coordination of care. _____________________________________   Eppie Gibson, MD  This document serves as a record of services personally performed by Eppie Gibson, MD. It was created on her behalf by Maryla Morrow, a trained medical scribe. The creation of this record is based on the scribe's personal observations and the  provider's statements to them. This document has been checked and approved by the attending provider.

## 2016-05-22 NOTE — Progress Notes (Signed)
Ms. Didonna presents for follow up of radiation completed 04/02/16 to her Right Breast. She denies pain, and her fatigue has improved. Her Right Breast has healed, she tells me that her Right nipple is lighter than it was before. She is using her regular lotion to her Right Breast which contains vitamin E.   BP 117/65   Pulse 71   Temp 98.2 F (36.8 C)   Ht 5\' 7"  (1.702 m)   Wt 158 lb 9.6 oz (71.9 kg)   SpO2 100% Comment: room air  BMI 24.84 kg/m    Wt Readings from Last 3 Encounters:  05/22/16 158 lb 9.6 oz (71.9 kg)  05/05/16 160 lb 3.2 oz (72.7 kg)  03/30/16 157 lb 12.8 oz (71.6 kg)

## 2016-06-24 ENCOUNTER — Telehealth: Payer: Self-pay | Admitting: Adult Health

## 2016-06-24 NOTE — Telephone Encounter (Signed)
Called patient and made them aware of SCP scheduled in April Per Charlestine Massed, NP.

## 2016-07-20 ENCOUNTER — Ambulatory Visit (INDEPENDENT_AMBULATORY_CARE_PROVIDER_SITE_OTHER): Payer: BLUE CROSS/BLUE SHIELD | Admitting: Pediatrics

## 2016-07-20 ENCOUNTER — Encounter: Payer: Self-pay | Admitting: Pediatrics

## 2016-07-20 VITALS — BP 104/68 | HR 73 | Temp 98.6°F | Resp 16 | Ht 66.75 in | Wt 163.0 lb

## 2016-07-20 DIAGNOSIS — J4520 Mild intermittent asthma, uncomplicated: Secondary | ICD-10-CM

## 2016-07-20 DIAGNOSIS — J301 Allergic rhinitis due to pollen: Secondary | ICD-10-CM

## 2016-07-20 DIAGNOSIS — J683 Other acute and subacute respiratory conditions due to chemicals, gases, fumes and vapors: Secondary | ICD-10-CM

## 2016-07-20 DIAGNOSIS — K588 Other irritable bowel syndrome: Secondary | ICD-10-CM | POA: Diagnosis not present

## 2016-07-20 DIAGNOSIS — J3089 Other allergic rhinitis: Secondary | ICD-10-CM

## 2016-07-20 DIAGNOSIS — Z171 Estrogen receptor negative status [ER-]: Secondary | ICD-10-CM | POA: Diagnosis not present

## 2016-07-20 DIAGNOSIS — C50511 Malignant neoplasm of lower-outer quadrant of right female breast: Secondary | ICD-10-CM | POA: Diagnosis not present

## 2016-07-20 MED ORDER — FLUTICASONE PROPIONATE 50 MCG/ACT NA SUSP: 2.0000 | Freq: Every day | NASAL | 5 refills | Status: DC

## 2016-07-20 MED ORDER — MONTELUKAST SODIUM 10 MG PO TABS: 10.0000 mg | ORAL_TABLET | Freq: Every day | ORAL | 5 refills | Status: DC

## 2016-07-20 NOTE — Progress Notes (Signed)
[place] [address] Dept: [phone]  New Patient Note  Patient ID: Barbara Fuentes, female    DOB: Jun 30, 1965  Age: 51 y.o. MRN: [id number] Date of Office Visit: 07/20/2016 Referring provider: [name], MD [address] [place], [place] [address]    Chief Complaint: Allergies  HPI Barbara Fuentes presents for evaluation of perennial nasal congestion for about 10 months. She has had allergic symptoms since childhood. She moved into a 52 year old house about a year ago and her symptoms have been worse since then. She has aggravation of her symptoms on exposure to dust, tree pollen, cats and dogs. She had one asthma attack in college and occasionally coughs but has never had any wheezing or shortness of breath. She does not have any difficulties with exercise.In  childhood , strawberries give her fullness in the throat and she has not had  strawberries since then. She does not have a history of eczema. She had a rhinoplasty. Zyrtec was not very helpful. She takes Benadryl at night. She is around horses frequently  Review of Systems  Constitutional: Negative.   HENT:       Perennial nasal congestion for several years. History of a rhinoplasty.  Eyes: Negative.   Respiratory:       Occasional cough when she is having allergy attacks  Cardiovascular: Negative.   Gastrointestinal:       IBS  Genitourinary:       One year ago she had a lumpectomy with radiation. Stage 0. Hormone negative  Musculoskeletal: Negative.   Skin: Negative.   Neurological:       Cervical spinal fusion  Endo/Heme/Allergies:       No diabetes or thyroid disease  Psychiatric/Behavioral: Negative.     Outpatient Encounter Prescriptions as of 07/20/2016  Medication Sig  . Calcium-Magnesium-Vitamin D (CALCIUM 500 PO) Take 1 tablet by mouth daily.  . cetirizine (ZYRTEC) 10 MG tablet Take 10 mg by mouth daily.  . Cholecalciferol (VITAMIN D PO) Take 2,000 Units by mouth.  .  Cyanocobalamin (VITAMIN B 12 PO) Take by mouth.  . Multiple Vitamin (MULTIVITAMIN) tablet Take 1 tablet by mouth daily.  . vitamin C (ASCORBIC ACID) 250 MG tablet Take 250 mg by mouth daily.  . fluticasone (FLONASE) 50 MCG/ACT nasal spray Place 2 sprays into both nostrils daily.  . montelukast (SINGULAIR) 10 MG tablet Take 1 tablet (10 mg total) by mouth daily.   No facility-administered encounter medications on file as of 07/20/2016.      Drug Allergies:  Allergies  Allergen Reactions  . Strawberry (Diagnostic) Anaphylaxis  . Codeine     REACTION: nausea & vomiting  . Procaine Hcl     REACTION: tachycardia    Family History: Adora's family history includes Alcohol abuse in her father, paternal grandfather, and paternal grandmother; Arthritis in her mother; Bronchitis in her paternal grandmother; COPD in her paternal grandmother; Colon cancer in her maternal grandfather; Depression in her brother and father; Diabetes in her father; Emphysema in her paternal grandmother; HIV in her brother; Heart attack (age of onset: 23) in her paternal grandfather; Heart disease in her mother and paternal grandfather; Other in her maternal grandmother; Prostate cancer (age of onset: 54) in her father..She has a son with allergic rhinitis and sinus problems and eczema. There is no family history of hives, food allergies  Social environmental there are 2 dogs in the home. They have a rabbit . Her daughter rides horses.  She is not exposed to cigarette smoking. She smoked cigarettes from 1983-2012 . She works indoors. Her symptoms are not worse at work. 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  Physical Exam: BP 104/68   Pulse 73   Temp 98.6 F (37 C) (Oral)   Resp 16   Ht 5' 6.75" (1.695 m)   Wt 163 lb (73.9 kg)   SpO2 97%   BMI 25.72 kg/m    Physical Exam  Constitutional: She is oriented to person, place, and time. She appears well-developed and well-nourished.  HENT:  Eyes normal. Ears normal.  Nose moderate swelling of nasal turbinates with clear nasal discharge. There was a small polyp in the left nostril. Pharynx normal.  Neck: Neck supple. No thyromegaly present.  Cardiovascular:  S1 and S2 normal no murmurs  Pulmonary/Chest:  Clear to percussion and auscultation  Abdominal: Soft. There is no tenderness (no hepatosplenomegaly).  Lymphadenopathy:    She has no cervical adenopathy.  Neurological: She is alert and oriented to person, place, and time.  Skin:  Clear  Psychiatric: She has a normal mood and affect. Her behavior is normal. Judgment and thought content normal.  Vitals reviewed.   Diagnostics: FVC 3.13 L FEV1 2.49 L. Predicted FVC 3.89 L predicted FEV1 3.07 L-the spirometry is in the normal range  Allergy skin tests were positive to a common indoor mold, cat, dog, horse, cockroach, mouse. She also had positive skin test to grass pollen and tree pollens on intradermal testing. Skin testing to strawberry was negative   Assessment  Assessment and Plan: 1. 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 Other allergic rhinitis   3. Malignant neoplasm of lower-outer quadrant of right breast of female, estrogen receptor negative ([place])   4. Other irritable bowel syndrome     Meds ordered this encounter  Medications  . fluticasone (FLONASE) 50 MCG/ACT nasal spray    Sig: Place 2 sprays into both nostrils daily.    Dispense:  16 g    Refill:  5  . montelukast (SINGULAIR) 10 MG tablet    Sig: Take 1 tablet (10 mg total) by mouth daily.    Dispense:  30 tablet    Refill:  5    Patient Instructions  Environmental control of dust and mold Allegra 180 mg in the morning and Benadryl 25 mg at night Fluticasone 2 sprays per nostril once a day for stuffy nose Montelukast  10 mg once a day for coughing or wheezing. It may help with nasal congestion Opcon-A one drop 3 times a day if needed for itchy eyes Prednisone 20 mg twice a day  for 3 days, 20 mg on the fourth day, 10 mg on the fifth day to bring your allergic symptoms under control Use a HEPA filter in her bedroom Keep the dogs out of her bedroom Call me if you are not doing better on this treatment plan If she wants to eat  strawberries, we could do a gradual oral challenge to strawberry Follow-up in 2 weeks to start her on allergy injections   Return in about 4 weeks (around 08/17/2016).   Thank you for the opportunity to care for this patient.  Please do not hesitate to contact me with questions.  Penne Lash, M.D.  [facility] [address] [place], [place] [address] [phone]

## 2016-07-20 NOTE — Patient Instructions (Addendum)
Environmental control of dust and mold Allegra 180 mg in the morning and Benadryl 25 mg at night Fluticasone 2 sprays per nostril once a day for stuffy nose Montelukast  10 mg once a day for coughing or wheezing. It may help with nasal congestion Opcon-A one drop 3 times a day if needed for itchy eyes Prednisone 20 mg twice a day for 3 days, 20 mg on the fourth day, 10 mg on the fifth day to bring your allergic symptoms under control Use a HEPA filter in her bedroom Keep the dogs out of her bedroom Call me if you are not doing better on this treatment plan If she wants to eat  strawberries, we could do a gradual oral challenge to strawberry Follow-up in 2 weeks to start her on allergy injections

## 2016-07-21 DIAGNOSIS — J301 Allergic rhinitis due to pollen: Secondary | ICD-10-CM | POA: Diagnosis not present

## 2016-07-21 NOTE — Addendum Note (Signed)
Addended by: Prince Solian A on: 07/21/2016 02:56 PM   Modules accepted: Orders

## 2016-07-22 DIAGNOSIS — J3089 Other allergic rhinitis: Secondary | ICD-10-CM | POA: Diagnosis not present

## 2016-07-29 ENCOUNTER — Encounter (HOSPITAL_COMMUNITY): Payer: Self-pay

## 2016-08-03 ENCOUNTER — Ambulatory Visit (INDEPENDENT_AMBULATORY_CARE_PROVIDER_SITE_OTHER): Payer: BLUE CROSS/BLUE SHIELD

## 2016-08-03 DIAGNOSIS — J309 Allergic rhinitis, unspecified: Secondary | ICD-10-CM | POA: Diagnosis not present

## 2016-08-03 MED ORDER — EPINEPHRINE 0.3 MG/0.3ML IJ SOAJ: INTRAMUSCULAR | 3 refills | Status: DC

## 2016-08-03 NOTE — Progress Notes (Signed)
Immunotherapy   Patient Details  Name: Barbara Fuentes MRN: [id number] Date of Birth: 12/22/1965  08/03/2016  Barbara Fuentes started injections for Blue 1 :100,000 [employer]-CR Following schedule: B  Frequency:1 time per week Epi-Pen:Epi-Pen Available  Consent signed and patient instructions given.    Orpah Greek 08/03/2016, 8:50 AM

## 2016-08-03 NOTE — Addendum Note (Signed)
Addended by: Orpah Greek D on: 08/03/2016 01:39 PM   Modules accepted: Orders

## 2016-08-10 ENCOUNTER — Ambulatory Visit (INDEPENDENT_AMBULATORY_CARE_PROVIDER_SITE_OTHER): Payer: BLUE CROSS/BLUE SHIELD

## 2016-08-10 DIAGNOSIS — J309 Allergic rhinitis, unspecified: Secondary | ICD-10-CM | POA: Diagnosis not present

## 2016-08-17 ENCOUNTER — Ambulatory Visit (INDEPENDENT_AMBULATORY_CARE_PROVIDER_SITE_OTHER): Payer: BLUE CROSS/BLUE SHIELD

## 2016-08-17 ENCOUNTER — Telehealth: Payer: Self-pay | Admitting: Adult Health

## 2016-08-17 DIAGNOSIS — J309 Allergic rhinitis, unspecified: Secondary | ICD-10-CM

## 2016-08-17 NOTE — Telephone Encounter (Signed)
Pt called to cxl SCP on 4/27 due to work schedule. Pt will call back in 2 weeks to r/s appt

## 2016-08-21 ENCOUNTER — Encounter: Payer: BLUE CROSS/BLUE SHIELD | Admitting: Adult Health

## 2016-08-25 ENCOUNTER — Ambulatory Visit (INDEPENDENT_AMBULATORY_CARE_PROVIDER_SITE_OTHER): Payer: BLUE CROSS/BLUE SHIELD | Admitting: *Deleted

## 2016-08-25 DIAGNOSIS — J309 Allergic rhinitis, unspecified: Secondary | ICD-10-CM

## 2016-08-27 ENCOUNTER — Ambulatory Visit (HOSPITAL_BASED_OUTPATIENT_CLINIC_OR_DEPARTMENT_OTHER): Payer: BLUE CROSS/BLUE SHIELD | Admitting: Adult Health

## 2016-08-27 VITALS — BP 104/70 | HR 77 | Temp 98.0°F | Resp 18 | Ht 66.75 in | Wt 162.2 lb

## 2016-08-27 DIAGNOSIS — C50511 Malignant neoplasm of lower-outer quadrant of right female breast: Secondary | ICD-10-CM | POA: Diagnosis not present

## 2016-08-27 DIAGNOSIS — Z171 Estrogen receptor negative status [ER-]: Secondary | ICD-10-CM | POA: Diagnosis not present

## 2016-08-27 NOTE — Progress Notes (Signed)
CLINIC:  Survivorship   REASON FOR VISIT:  Routine follow-up post-treatment for a recent history of breast cancer.  BRIEF ONCOLOGIC HISTORY:    Malignant neoplasm of lower-outer quadrant of right breast of female, estrogen receptor negative ([place])   12/18/2015 Mammogram    Right breast: 2 cm group of suspicious calcifications within the outer right breast      12/19/2015 Initial Diagnosis    Right breast DCIS with calcifications, complex sclerosing lesion, ER 0%, PR 0%, Tis N0 stage 0      12/26/2015 Genetic Testing    Genetic testing was normal, and did not reveal a deleterious mutation..  Additionally, no variants of uncertain significance (VUSes) were found. Genes tested include: ATM, BARD1, BRCA1, BRCA2, BRIP1, CDH1, CHEK2, FANCC, MLH1, MSH2, MSH6, NBN, PALB2, PMS2, PTEN, RAD51C, RAD51D, TP53, and XRCC2.  This panel also includes deletion/duplication analysis (without sequencing) for one gene, EPCAM.      01/10/2016 Surgery    Right Lumpectomy Lucia Gaskins): DCIS with necrosis and calcs, high grade, 3cm, fibrocystic changes, complex sclerosing lesion, medial and lateral margins +,       01/20/2016 Surgery    Right breast excision: UDH, fibrocystic changes; right posterior excision: Benign, no evidence of carcinoma      03/05/2016 - 04/02/2016 Radiation Therapy    Isidore Moos) 1. The Right breast was treated to 40.05 Gy in 15 fractions at 2.67 Gy per fraction. 2. The Right breast was boosted to 10 Gy in 5 fractions at 2 Gy per fraction.        INTERVAL HISTORY:  Barbara Fuentes presents to the [facility] today for our initial meeting to review her survivorship care plan detailing her treatment course for breast cancer, as well as monitoring long-term side effects of that treatment, education regarding health maintenance, screening, and overall wellness and health promotion.     Overall, Barbara Fuentes reports feeling quite well since completing her radiation therapy.  She is  experiencing a two week history of right breast itching, as if the feeling is coming back into her breasts.  She says she had a lot of numbness from her surgery.  She denies any new lumps, bumps, swelling, warmth, erythema, rash or any further concerns with her breasts.     REVIEW OF SYSTEMS:  Review of Systems  Constitutional: Negative for appetite change, chills, diaphoresis, fatigue, fever and unexpected weight change.  HENT:   Negative for hearing loss and lump/mass.   Eyes: Negative for eye problems and icterus.  Respiratory: Negative for chest tightness, cough, hemoptysis, shortness of breath and wheezing.   Cardiovascular: Negative for chest pain, leg swelling and palpitations.  Gastrointestinal: Negative for abdominal distention and abdominal pain.  Endocrine: Positive for hot flashes.  Genitourinary: Negative for difficulty urinating.   Musculoskeletal: Negative for arthralgias and gait problem.  Skin: Positive for itching. Negative for rash.  Neurological: Negative for dizziness, gait problem and headaches.  Hematological: Negative for adenopathy. Does not bruise/bleed easily.  Psychiatric/Behavioral: Negative for depression. The patient is not nervous/anxious.   Breast: Denies any new nodularity, masses, tenderness, nipple changes, or nipple discharge.       ONCOLOGY TREATMENT TEAM:  1. Surgeon:  Dr. Lucia Gaskins at [facility] 2. Medical Oncologist: Dr. Lindi Adie 3. Radiation Oncologist: Dr. Isidore Moos    PAST MEDICAL/SURGICAL HISTORY:  Past Medical History:  Diagnosis Date  . Alkaline phosphatase deficiency    28 in 9/11  . Allergic state 08/23/2015  . Arthritis    hip  .  Breast cancer of lower-outer quadrant of right female breast ([place]) 12/20/2015  . Eustachian tube dysfunction 02/2014  . H/O recurrent pneumonia 08/23/2015  . History of chicken pox 08/23/2015  . History of radiation therapy 03/05/16- 04/02/16   Right Breast treated to 40.05 Gy in 15 fractions &  Right Breast boost to 10 Gy in 5 fractions.   . Low BP   . Perimenopause 08/23/2015  . Pneumonia   . Preventative health care 08/23/2015  . RAD (reactive airway disease)     FORMER smoker , RAD with RTI's, cat or allergen exposure  . Sacral pain 2014   trauma ( fall)  . Serous otitis media 02/2014   Past Surgical History:  Procedure Laterality Date  . BREAST LUMPECTOMY Right 01/20/2016   Procedure: RIGHT BREAST LUMPECTOMY;  Surgeon: [name], MD;  Location: [facility];  Service: General;  Laterality: Right;  . BREAST LUMPECTOMY WITH RADIOACTIVE SEED LOCALIZATION Right 01/10/2016   Procedure: BREAST LUMPECTOMY WITH RADIOACTIVE SEED LOCALIZATION;  Surgeon: [name], MD;  Location: [place];  Service: General;  Laterality: Right;  . CERVICAL FUSION  07/30/2010   & Discectomy, Dr Vertell Limber ( @ 3 levels)  . SEPTOPLASTY  1983   for septal deviation  . TONSILLECTOMY    . uterine ablation  2008   Dr Ronita Hipps  . WISDOM TOOTH EXTRACTION       ALLERGIES:  Allergies  Allergen Reactions  . Codeine     REACTION: nausea & vomiting  . Procaine Hcl     REACTION: tachycardia     CURRENT MEDICATIONS:  Outpatient Encounter Prescriptions as of 08/27/2016  Medication Sig  . fexofenadine (ALLEGRA) 30 MG tablet Take 30 mg by mouth 2 (two) times daily.  . Calcium-Magnesium-Vitamin D (CALCIUM 500 PO) Take 1 tablet by mouth daily.  . Cholecalciferol (VITAMIN D PO) Take 2,000 Units by mouth.  . Cyanocobalamin (VITAMIN B 12 PO) Take by mouth.  . EPINEPHrine (EPIPEN 2-PAK) 0.3 mg/0.3 mL IJ SOAJ injection Use as directed for severe allergic reactions  . Multiple Vitamin (MULTIVITAMIN) tablet Take 1 tablet by mouth daily.  . vitamin C (ASCORBIC ACID) 250 MG tablet Take 250 mg by mouth daily.  . [DISCONTINUED] cetirizine (ZYRTEC) 10 MG tablet Take 10 mg by mouth daily.  . [DISCONTINUED] fluticasone (FLONASE) 50 MCG/ACT nasal spray Place 2 sprays into both nostrils daily.  . [DISCONTINUED]  montelukast (SINGULAIR) 10 MG tablet Take 1 tablet (10 mg total) by mouth daily.   No facility-administered encounter medications on file as of 08/27/2016.      ONCOLOGIC FAMILY HISTORY:  Family History  Problem Relation Age of Onset  . Diabetes Father     Type II, AO  . Alcohol abuse Father   . Depression Father   . Prostate cancer Father 28  . Depression Brother   . HIV Brother     AIDS  . Heart attack Paternal Grandfather 58  . Heart disease Paternal Grandfather   . Alcohol abuse Paternal Grandfather     smoker  . Colon cancer Maternal Grandfather     dx late 27s; w/ colostomy  . COPD Paternal Grandmother   . Alcohol abuse Paternal Grandmother     smoker  . Emphysema Paternal Grandmother   . Bronchitis Paternal Grandmother   . Heart disease Mother     cardiac rest, epinephrine with Novocaine  . Arthritis Mother   . Other Maternal Grandmother     issues w/ breast in her 90s--may have  been breast cancer  . Stroke Neg Hx   . Asthma Neg Hx   . Allergic rhinitis Neg Hx   . Angioedema Neg Hx   . Eczema Neg Hx   . Immunodeficiency Neg Hx   . Urticaria Neg Hx      GENETIC COUNSELING/TESTING: Genetic testing was normal, and did not reveal a deleterious mutation..  Additionally, no variants of uncertain significance (VUSes) were found. Genes tested include: ATM, BARD1, BRCA1, BRCA2, BRIP1, CDH1, CHEK2, FANCC, MLH1, MSH2, MSH6, NBN, PALB2, PMS2, PTEN, RAD51C, RAD51D, TP53, and XRCC2.  This panel also includes deletion/duplication analysis (without sequencing) for one gene, EPCAM.   SOCIAL HISTORY:  KYOMI HECTOR is married and lives with her husband in [place]. She denies any current or history of tobacco, alcohol, or illicit drug use.     PHYSICAL EXAMINATION:  Vital Signs:   Vitals:   08/27/16 1455  BP: 104/70  Pulse: 77  Resp: 18  Temp: 98 F (36.7 C)   Filed Weights   08/27/16 1455  Weight: 162 lb 3.2 oz (73.6 kg)   General:  Well-nourished, well-appearing female in no acute distress.  She is unaccompanied today.   HEENT: Head is normocephalic.  Pupils equal and reactive to light. Conjunctivae clear without exudate.  Sclerae anicteric. Oral mucosa is pink, moist.  Oropharynx is pink without lesions or erythema.  Lymph: No cervical, supraclavicular, or infraclavicular lymphadenopathy noted on palpation.  Cardiovascular: Regular rate and rhythm.Marland Kitchen Respiratory: Clear to auscultation bilaterally. Chest expansion symmetric; breathing non-labored.  Breast: right breast lumpectomy well healed, no tendenrness, no erythema, + dry skin, som peeling noted on right nipple that has been present since completing radiation, however is slowly resolving, left breast + dry skin, slight erythematous rash below left breast, but as a line, no vesicular lesions, no yeast like rash, not in fold, but about 2cm below.  No nodules, masses, skin, or nipple changes.   GI: Abdomen soft and round; non-tender, non-distended. Bowel sounds normoactive.  GU: Deferred.  Neuro: No focal deficits. Steady gait.  Psych: Mood and affect normal and appropriate for situation.  Extremities: No edema. Skin: Warm and dry.  LABORATORY DATA:  None for this visit.  DIAGNOSTIC IMAGING:  None for this visit.      ASSESSMENT AND PLAN:  Ms.. Fuentes is a pleasant 51 y.o. female with Stage 0 right breast DCIS, ER-/PR-/HER2-, diagnosed in 11/2015, treated with lumpectomy and adjuvant radiation therapy.  She presents to the [facility]orship [facility] for our initial meeting and routine follow-up post-completion of treatment for breast cancer.    1. Stage 0 right breast cancer:  Barbara Fuentes is continuing to recover from definitive treatment for breast cancer. She will follow-up with Dr. Lucia Gaskins in October, 2018.  Today, a comprehensive survivorship care plan and treatment summary was reviewed with the patient today detailing her breast cancer diagnosis, treatment course,  potential late/long-term effects of treatment, appropriate follow-up care with recommendations for the future, and patient education resources.  A copy of this summary, along with a letter will be sent to the patient's primary care provider via mail/fax/In Basket message after today's visit.    2. Cancer screening:  Due to Barbara Fuentes's history and her age, she should receive screening for skin cancers, colon cancer, and gynecologic cancers.  The information and recommendations are listed on the patient's comprehensive care plan/treatment summary and were reviewed in detail with the patient.    3. Health maintenance and wellness promotion: Ms.  Fuentes was encouraged to consume 5-7 servings of fruits and vegetables per day. We reviewed the "Nutrition Rainbow" handout, as well as the handout "Take Control of Your Health and Reduce Your Cancer Risk" from the [employer].  She was also encouraged to engage in moderate to vigorous exercise for 30 minutes per day most days of the week. We discussed the [employer] fitness program, which is designed for cancer survivors to help them become more physically fit after cancer treatments.  She was instructed to limit her alcohol consumption and continue to abstain from tobacco use.     4. Support services/counseling: It is not uncommon for this period of the patient's cancer care trajectory to be one of many emotions and stressors.  We discussed an opportunity for her to participate in the next session of [facility] ("Finding Your New Normal") support group series designed for patients after they have completed treatment.   Barbara Fuentes was encouraged to take advantage of our many other support services programs, support groups, and/or counseling in coping with her new life as a cancer survivor after completing anti-cancer treatment.  She was offered support today through active listening and expressive supportive counseling.  She was given information regarding  our available services and encouraged to contact me with any questions or for help enrolling in any of our support group/programs.    Dispo:   -Follow up with Dr. Lucia Gaskins in October, 2018.   -LTS follow up in April, 2019   A total of (30) minutes of face-to-face time was spent with this patient with greater than 50% of that time in counseling and care-coordination.   Gardenia Phlegm, [place] [phone]   Note: PRIMARY CARE PROVIDER [name], [place] [phone]

## 2016-09-01 ENCOUNTER — Ambulatory Visit (INDEPENDENT_AMBULATORY_CARE_PROVIDER_SITE_OTHER): Payer: BLUE CROSS/BLUE SHIELD

## 2016-09-01 DIAGNOSIS — J309 Allergic rhinitis, unspecified: Secondary | ICD-10-CM

## 2016-09-02 ENCOUNTER — Telehealth: Payer: Self-pay | Admitting: *Deleted

## 2016-09-02 NOTE — Telephone Encounter (Signed)
As noted below by Wilber Bihari, NP, I informed patient that she needs to follow up with Dr. Lucia Gaskins in October, 2018 and with Mendel Ryder, NP, in April 2019. She needs to have a diagnostic mammogram at the [facility] or [place]. Instructed her to call [facility] if she had any questions or concerns.

## 2016-09-02 NOTE — Telephone Encounter (Signed)
-----   Message from Gardenia Phlegm, NP sent at 09/02/2016  8:25 AM EDT ----- Marykay Lex, Can you please call patient and tell her that I heard from Dr. Lucia Gaskins.  She should follow up with him in October, 2018 and with me in April, 2019.  Can you put in schedule message?  Thanks,   Mendel Ryder ----- Message ----- From: [name], MD Sent: 09/01/2016   4:00 PM To: Gardenia Phlegm, NP  Mendel Ryder,  Yes, she was ER/PR negative. I last saw her in October 2017 - so she is about 7 months out. I would like to see her back, but alternating is fine.  Thanks, Shanon Brow  ----- Message ----- From: Gardenia Phlegm, NP Sent: 08/27/2016   3:24 PM To: [name], MD  Hi Dr. Lucia Gaskins,  Just wanted to touch base with you about this patient.  She hasn't seen you recently.  I saw her today and did a breast exam.  Do you want to see her in 6 months and alternate for a few years?  Any thoughts?  Dr. Lindi Adie is not seeing her.    Thanks,  Mendel Ryder

## 2016-09-03 ENCOUNTER — Encounter: Payer: Self-pay | Admitting: Adult Health

## 2016-09-04 ENCOUNTER — Telehealth: Payer: Self-pay | Admitting: Adult Health

## 2016-09-04 NOTE — Telephone Encounter (Signed)
Confirmed April 2019 appt w pt per sch msg

## 2016-09-08 ENCOUNTER — Ambulatory Visit (INDEPENDENT_AMBULATORY_CARE_PROVIDER_SITE_OTHER): Payer: BLUE CROSS/BLUE SHIELD | Admitting: *Deleted

## 2016-09-08 DIAGNOSIS — J309 Allergic rhinitis, unspecified: Secondary | ICD-10-CM | POA: Diagnosis not present

## 2016-09-15 ENCOUNTER — Ambulatory Visit (INDEPENDENT_AMBULATORY_CARE_PROVIDER_SITE_OTHER): Payer: BLUE CROSS/BLUE SHIELD | Admitting: *Deleted

## 2016-09-15 DIAGNOSIS — J309 Allergic rhinitis, unspecified: Secondary | ICD-10-CM

## 2016-09-23 ENCOUNTER — Ambulatory Visit (INDEPENDENT_AMBULATORY_CARE_PROVIDER_SITE_OTHER): Payer: BLUE CROSS/BLUE SHIELD

## 2016-09-23 DIAGNOSIS — J309 Allergic rhinitis, unspecified: Secondary | ICD-10-CM | POA: Diagnosis not present

## 2016-09-30 ENCOUNTER — Ambulatory Visit (INDEPENDENT_AMBULATORY_CARE_PROVIDER_SITE_OTHER): Payer: BLUE CROSS/BLUE SHIELD

## 2016-09-30 DIAGNOSIS — J309 Allergic rhinitis, unspecified: Secondary | ICD-10-CM

## 2016-10-07 ENCOUNTER — Ambulatory Visit (INDEPENDENT_AMBULATORY_CARE_PROVIDER_SITE_OTHER): Payer: BLUE CROSS/BLUE SHIELD

## 2016-10-07 DIAGNOSIS — J309 Allergic rhinitis, unspecified: Secondary | ICD-10-CM

## 2016-10-15 ENCOUNTER — Ambulatory Visit (INDEPENDENT_AMBULATORY_CARE_PROVIDER_SITE_OTHER): Payer: BLUE CROSS/BLUE SHIELD

## 2016-10-15 DIAGNOSIS — J309 Allergic rhinitis, unspecified: Secondary | ICD-10-CM | POA: Diagnosis not present

## 2016-10-27 ENCOUNTER — Ambulatory Visit (INDEPENDENT_AMBULATORY_CARE_PROVIDER_SITE_OTHER): Payer: BLUE CROSS/BLUE SHIELD | Admitting: *Deleted

## 2016-10-27 DIAGNOSIS — J309 Allergic rhinitis, unspecified: Secondary | ICD-10-CM

## 2016-11-04 ENCOUNTER — Encounter (HOSPITAL_COMMUNITY): Payer: Self-pay

## 2016-11-05 ENCOUNTER — Other Ambulatory Visit: Payer: Self-pay | Admitting: Obstetrics and Gynecology

## 2016-11-05 DIAGNOSIS — Z9889 Other specified postprocedural states: Secondary | ICD-10-CM

## 2016-11-05 DIAGNOSIS — Z853 Personal history of malignant neoplasm of breast: Secondary | ICD-10-CM

## 2016-11-16 ENCOUNTER — Ambulatory Visit (INDEPENDENT_AMBULATORY_CARE_PROVIDER_SITE_OTHER): Payer: BLUE CROSS/BLUE SHIELD

## 2016-11-16 DIAGNOSIS — J309 Allergic rhinitis, unspecified: Secondary | ICD-10-CM | POA: Diagnosis not present

## 2016-11-23 ENCOUNTER — Ambulatory Visit (INDEPENDENT_AMBULATORY_CARE_PROVIDER_SITE_OTHER): Payer: BLUE CROSS/BLUE SHIELD

## 2016-11-23 DIAGNOSIS — J309 Allergic rhinitis, unspecified: Secondary | ICD-10-CM

## 2016-12-01 ENCOUNTER — Ambulatory Visit (INDEPENDENT_AMBULATORY_CARE_PROVIDER_SITE_OTHER): Payer: BLUE CROSS/BLUE SHIELD

## 2016-12-01 DIAGNOSIS — J309 Allergic rhinitis, unspecified: Secondary | ICD-10-CM

## 2016-12-09 ENCOUNTER — Ambulatory Visit (INDEPENDENT_AMBULATORY_CARE_PROVIDER_SITE_OTHER): Payer: BLUE CROSS/BLUE SHIELD

## 2016-12-09 DIAGNOSIS — J309 Allergic rhinitis, unspecified: Secondary | ICD-10-CM | POA: Diagnosis not present

## 2016-12-16 ENCOUNTER — Ambulatory Visit (INDEPENDENT_AMBULATORY_CARE_PROVIDER_SITE_OTHER): Payer: BLUE CROSS/BLUE SHIELD

## 2016-12-16 DIAGNOSIS — J309 Allergic rhinitis, unspecified: Secondary | ICD-10-CM | POA: Diagnosis not present

## 2016-12-22 ENCOUNTER — Ambulatory Visit
Admission: RE | Admit: 2016-12-22 | Discharge: 2016-12-22 | Disposition: A | Discharge status: Home / Self Care | Payer: BLUE CROSS/BLUE SHIELD | Source: Ambulatory Visit | Attending: Obstetrics and Gynecology | Admitting: Obstetrics and Gynecology

## 2016-12-22 DIAGNOSIS — Z853 Personal history of malignant neoplasm of breast: Secondary | ICD-10-CM

## 2016-12-22 DIAGNOSIS — Z9889 Other specified postprocedural states: Secondary | ICD-10-CM

## 2016-12-23 ENCOUNTER — Ambulatory Visit (INDEPENDENT_AMBULATORY_CARE_PROVIDER_SITE_OTHER): Payer: BLUE CROSS/BLUE SHIELD | Admitting: *Deleted

## 2016-12-23 ENCOUNTER — Telehealth: Payer: Self-pay | Admitting: *Deleted

## 2016-12-23 DIAGNOSIS — J309 Allergic rhinitis, unspecified: Secondary | ICD-10-CM

## 2016-12-23 NOTE — Telephone Encounter (Signed)
I agree going to schedule A. If she reports any aggravation of her allergic symptoms, go down to 0.10 in the green vial and  use schedule A. She is to report any other allergic symptoms after an injection

## 2016-12-23 NOTE — Telephone Encounter (Signed)
Pt came in for allergy injections. She is on schedule B green vial mite-animals-cr-molds and grass-tree. She said she has been having a lot of systemic symptoms with this green vial, she has been congested, sneezing, itchy eyes, that is bad enough to the point of having to leave work early. Should we drop her down to schedule A? I did drop drop her dose down from 0.4 to 0.2 today.

## 2016-12-23 NOTE — Telephone Encounter (Signed)
Spoke with Dr Shaune Leeks if she does ok with 0.2cc today then we will advance per schedule A and if not we will go to 0.1cc and advance per schedule A.

## 2017-01-06 ENCOUNTER — Ambulatory Visit (INDEPENDENT_AMBULATORY_CARE_PROVIDER_SITE_OTHER): Payer: BLUE CROSS/BLUE SHIELD | Admitting: *Deleted

## 2017-01-06 DIAGNOSIS — J309 Allergic rhinitis, unspecified: Secondary | ICD-10-CM | POA: Diagnosis not present

## 2017-01-11 ENCOUNTER — Ambulatory Visit (INDEPENDENT_AMBULATORY_CARE_PROVIDER_SITE_OTHER): Payer: BLUE CROSS/BLUE SHIELD | Admitting: *Deleted

## 2017-01-11 DIAGNOSIS — J309 Allergic rhinitis, unspecified: Secondary | ICD-10-CM

## 2017-01-19 ENCOUNTER — Ambulatory Visit: Payer: Self-pay | Admitting: Pediatrics

## 2017-01-20 ENCOUNTER — Ambulatory Visit (INDEPENDENT_AMBULATORY_CARE_PROVIDER_SITE_OTHER): Payer: BLUE CROSS/BLUE SHIELD

## 2017-01-20 DIAGNOSIS — J309 Allergic rhinitis, unspecified: Secondary | ICD-10-CM

## 2017-01-27 ENCOUNTER — Ambulatory Visit (INDEPENDENT_AMBULATORY_CARE_PROVIDER_SITE_OTHER): Payer: BLUE CROSS/BLUE SHIELD

## 2017-01-27 DIAGNOSIS — J309 Allergic rhinitis, unspecified: Secondary | ICD-10-CM | POA: Diagnosis not present

## 2017-02-03 ENCOUNTER — Ambulatory Visit (INDEPENDENT_AMBULATORY_CARE_PROVIDER_SITE_OTHER): Payer: BLUE CROSS/BLUE SHIELD | Admitting: *Deleted

## 2017-02-03 DIAGNOSIS — J309 Allergic rhinitis, unspecified: Secondary | ICD-10-CM

## 2017-02-16 ENCOUNTER — Ambulatory Visit (INDEPENDENT_AMBULATORY_CARE_PROVIDER_SITE_OTHER): Payer: BLUE CROSS/BLUE SHIELD | Admitting: *Deleted

## 2017-02-16 DIAGNOSIS — J309 Allergic rhinitis, unspecified: Secondary | ICD-10-CM | POA: Diagnosis not present

## 2017-02-19 ENCOUNTER — Encounter: Payer: BLUE CROSS/BLUE SHIELD | Admitting: Family Medicine

## 2017-02-26 ENCOUNTER — Ambulatory Visit (INDEPENDENT_AMBULATORY_CARE_PROVIDER_SITE_OTHER): Payer: BLUE CROSS/BLUE SHIELD | Admitting: *Deleted

## 2017-02-26 DIAGNOSIS — J309 Allergic rhinitis, unspecified: Secondary | ICD-10-CM

## 2017-03-03 ENCOUNTER — Ambulatory Visit (INDEPENDENT_AMBULATORY_CARE_PROVIDER_SITE_OTHER): Payer: BLUE CROSS/BLUE SHIELD

## 2017-03-03 DIAGNOSIS — J309 Allergic rhinitis, unspecified: Secondary | ICD-10-CM | POA: Diagnosis not present

## 2017-03-10 ENCOUNTER — Ambulatory Visit (INDEPENDENT_AMBULATORY_CARE_PROVIDER_SITE_OTHER): Payer: BLUE CROSS/BLUE SHIELD

## 2017-03-10 DIAGNOSIS — J309 Allergic rhinitis, unspecified: Secondary | ICD-10-CM | POA: Diagnosis not present

## 2017-03-17 ENCOUNTER — Ambulatory Visit (INDEPENDENT_AMBULATORY_CARE_PROVIDER_SITE_OTHER): Payer: BLUE CROSS/BLUE SHIELD

## 2017-03-17 DIAGNOSIS — J309 Allergic rhinitis, unspecified: Secondary | ICD-10-CM

## 2017-03-25 ENCOUNTER — Ambulatory Visit (INDEPENDENT_AMBULATORY_CARE_PROVIDER_SITE_OTHER): Payer: BLUE CROSS/BLUE SHIELD | Admitting: *Deleted

## 2017-03-25 DIAGNOSIS — J309 Allergic rhinitis, unspecified: Secondary | ICD-10-CM | POA: Diagnosis not present

## 2017-03-31 ENCOUNTER — Ambulatory Visit (INDEPENDENT_AMBULATORY_CARE_PROVIDER_SITE_OTHER): Payer: BLUE CROSS/BLUE SHIELD

## 2017-03-31 DIAGNOSIS — J309 Allergic rhinitis, unspecified: Secondary | ICD-10-CM | POA: Diagnosis not present

## 2017-04-07 ENCOUNTER — Ambulatory Visit (INDEPENDENT_AMBULATORY_CARE_PROVIDER_SITE_OTHER): Payer: BLUE CROSS/BLUE SHIELD | Admitting: *Deleted

## 2017-04-07 DIAGNOSIS — J309 Allergic rhinitis, unspecified: Secondary | ICD-10-CM | POA: Diagnosis not present

## 2017-04-13 ENCOUNTER — Ambulatory Visit (INDEPENDENT_AMBULATORY_CARE_PROVIDER_SITE_OTHER): Payer: BLUE CROSS/BLUE SHIELD | Admitting: *Deleted

## 2017-04-13 DIAGNOSIS — J309 Allergic rhinitis, unspecified: Secondary | ICD-10-CM

## 2017-04-21 ENCOUNTER — Ambulatory Visit (INDEPENDENT_AMBULATORY_CARE_PROVIDER_SITE_OTHER): Payer: BLUE CROSS/BLUE SHIELD

## 2017-04-21 DIAGNOSIS — J309 Allergic rhinitis, unspecified: Secondary | ICD-10-CM | POA: Diagnosis not present

## 2017-04-26 ENCOUNTER — Ambulatory Visit (INDEPENDENT_AMBULATORY_CARE_PROVIDER_SITE_OTHER): Payer: BLUE CROSS/BLUE SHIELD

## 2017-04-26 DIAGNOSIS — J309 Allergic rhinitis, unspecified: Secondary | ICD-10-CM | POA: Diagnosis not present

## 2017-05-04 ENCOUNTER — Ambulatory Visit (INDEPENDENT_AMBULATORY_CARE_PROVIDER_SITE_OTHER): Payer: BLUE CROSS/BLUE SHIELD

## 2017-05-04 DIAGNOSIS — J309 Allergic rhinitis, unspecified: Secondary | ICD-10-CM | POA: Diagnosis not present

## 2017-05-10 ENCOUNTER — Ambulatory Visit (INDEPENDENT_AMBULATORY_CARE_PROVIDER_SITE_OTHER): Payer: BLUE CROSS/BLUE SHIELD

## 2017-05-10 DIAGNOSIS — J309 Allergic rhinitis, unspecified: Secondary | ICD-10-CM | POA: Diagnosis not present

## 2017-05-18 ENCOUNTER — Ambulatory Visit (INDEPENDENT_AMBULATORY_CARE_PROVIDER_SITE_OTHER): Payer: BLUE CROSS/BLUE SHIELD

## 2017-05-18 DIAGNOSIS — J309 Allergic rhinitis, unspecified: Secondary | ICD-10-CM | POA: Diagnosis not present

## 2017-05-25 ENCOUNTER — Ambulatory Visit (INDEPENDENT_AMBULATORY_CARE_PROVIDER_SITE_OTHER): Payer: BLUE CROSS/BLUE SHIELD | Admitting: *Deleted

## 2017-05-25 DIAGNOSIS — J309 Allergic rhinitis, unspecified: Secondary | ICD-10-CM | POA: Diagnosis not present

## 2017-05-31 ENCOUNTER — Encounter: Payer: Self-pay | Admitting: Family Medicine

## 2017-05-31 ENCOUNTER — Ambulatory Visit (INDEPENDENT_AMBULATORY_CARE_PROVIDER_SITE_OTHER): Payer: BLUE CROSS/BLUE SHIELD | Admitting: Family Medicine

## 2017-05-31 VITALS — BP 98/68 | HR 61 | Temp 98.2°F | Resp 18 | Ht 67.0 in | Wt 168.6 lb

## 2017-05-31 DIAGNOSIS — N951 Menopausal and female climacteric states: Secondary | ICD-10-CM | POA: Diagnosis not present

## 2017-05-31 DIAGNOSIS — Z0000 Encounter for general adult medical examination without abnormal findings: Secondary | ICD-10-CM | POA: Diagnosis not present

## 2017-05-31 DIAGNOSIS — J3089 Other allergic rhinitis: Secondary | ICD-10-CM | POA: Diagnosis not present

## 2017-05-31 DIAGNOSIS — Z800 Family history of malignant neoplasm of digestive organs: Secondary | ICD-10-CM

## 2017-05-31 DIAGNOSIS — Z1211 Encounter for screening for malignant neoplasm of colon: Secondary | ICD-10-CM

## 2017-05-31 LAB — LIPID PANEL
CHOLESTEROL: 162 mg/dL (ref 0–200)
HDL: 75.4 mg/dL (ref >39.00)
LDL Cholesterol: 74 mg/dL (ref 0–99)
NonHDL: 87.04
Total CHOL/HDL Ratio: 2
Triglycerides: 66 mg/dL (ref 0.0–149.0)
VLDL: 13.2 mg/dL (ref 0.0–40.0)

## 2017-05-31 LAB — CBC
HCT: 37.1 % (ref 36.0–46.0)
Hemoglobin: 12.5 g/dL (ref 12.0–15.0)
MCHC: 33.6 g/dL (ref 30.0–36.0)
MCV: 89.4 fl (ref 78.0–100.0)
PLATELETS: 327 10*3/uL (ref 150.0–400.0)
RBC: 4.15 Mil/uL (ref 3.87–5.11)
RDW: 13.3 % (ref 11.5–15.5)
WBC: 7.5 10*3/uL (ref 4.0–10.5)

## 2017-05-31 LAB — COMPREHENSIVE METABOLIC PANEL
ALBUMIN: 3.9 g/dL (ref 3.5–5.2)
ALK PHOS: 33 U/L — AB (ref 39–117)
ALT: 12 U/L (ref 0–35)
AST: 13 U/L (ref 0–37)
BILIRUBIN TOTAL: 0.5 mg/dL (ref 0.2–1.2)
BUN: 9 mg/dL (ref 6–23)
CALCIUM: 8.9 mg/dL (ref 8.4–10.5)
CO2: 25 mEq/L (ref 19–32)
CREATININE: 0.6 mg/dL (ref 0.40–1.20)
Chloride: 104 mEq/L (ref 96–112)
GFR: 111.34 mL/min (ref >60.00)
Glucose, Bld: 94 mg/dL (ref 70–99)
Potassium: 4.2 mEq/L (ref 3.5–5.1)
SODIUM: 137 meq/L (ref 135–145)
TOTAL PROTEIN: 6.8 g/dL (ref 6.0–8.3)

## 2017-05-31 LAB — TSH: TSH: 2.99 u[IU]/mL (ref 0.35–4.50)

## 2017-05-31 NOTE — Assessment & Plan Note (Signed)
Patient encouraged to maintain heart healthy diet, regular exercise, adequate sleep. Consider daily probiotics. Take medications as prescribed 

## 2017-05-31 NOTE — Patient Instructions (Addendum)
Vitamin D 2000 IU daily, multistrain probiotic, Krill or fish oil daily, Curcumen capsule daily  OB/GYN Dr Megan Salon   DASH or MIND diets  Shingrix is the new shot, 2 shots over 2-6 months, call insurance and confirm payment     Preventive Care 40-64 Years, Female Preventive care refers to lifestyle choices and visits with your health care provider that can promote health and wellness. What does preventive care include?  A yearly physical exam. This is also called an annual well check.  Dental exams once or twice a year.  Routine eye exams. Ask your health care provider how often you should have your eyes checked.  Personal lifestyle choices, including: ? Daily care of your teeth and gums. ? Regular physical activity. ? Eating a healthy diet. ? Avoiding tobacco and drug use. ? Limiting alcohol use. ? Practicing safe sex. ? Taking low-dose aspirin daily starting at age 21. ? Taking vitamin and mineral supplements as recommended by your health care provider. What happens during an annual well check? The services and screenings done by your health care provider during your annual well check will depend on your age, overall health, lifestyle risk factors, and family history of disease. Counseling Your health care provider may ask you questions about your:  Alcohol use.  Tobacco use.  Drug use.  Emotional well-being.  Home and relationship well-being.  Sexual activity.  Eating habits.  Work and work Statistician.  Method of birth control.  Menstrual cycle.  Pregnancy history.  Screening You may have the following tests or measurements:  Height, weight, and BMI.  Blood pressure.  Lipid and cholesterol levels. These may be checked every 5 years, or more frequently if you are over 80 years old.  Skin check.  Lung cancer screening. You may have this screening every year starting at age 26 if you have a 30-pack-year history of smoking and currently smoke or  have quit within the past 15 years.  Fecal occult blood test (FOBT) of the stool. You may have this test every year starting at age 68.  Flexible sigmoidoscopy or colonoscopy. You may have a sigmoidoscopy every 5 years or a colonoscopy every 10 years starting at age 42.  Hepatitis C blood test.  Hepatitis B blood test.  Sexually transmitted disease (STD) testing.  Diabetes screening. This is done by checking your blood sugar (glucose) after you have not eaten for a while (fasting). You may have this done every 1-3 years.  Mammogram. This may be done every 1-2 years. Talk to your health care provider about when you should start having regular mammograms. This may depend on whether you have a family history of breast cancer.  BRCA-related cancer screening. This may be done if you have a family history of breast, ovarian, tubal, or peritoneal cancers.  Pelvic exam and Pap test. This may be done every 3 years starting at age 47. Starting at age 47, this may be done every 5 years if you have a Pap test in combination with an HPV test.  Bone density scan. This is done to screen for osteoporosis. You may have this scan if you are at high risk for osteoporosis.  Discuss your test results, treatment options, and if necessary, the need for more tests with your health care provider. Vaccines Your health care provider may recommend certain vaccines, such as:  Influenza vaccine. This is recommended every year.  Tetanus, diphtheria, and acellular pertussis (Tdap, Td) vaccine. You may need a Td booster  every 10 years.  Varicella vaccine. You may need this if you have not been vaccinated.  Zoster vaccine. You may need this after age 13.  Measles, mumps, and rubella (MMR) vaccine. You may need at least one dose of MMR if you were born in 1957 or later. You may also need a second dose.  Pneumococcal 13-valent conjugate (PCV13) vaccine. You may need this if you have certain conditions and were not  previously vaccinated.  Pneumococcal polysaccharide (PPSV23) vaccine. You may need one or two doses if you smoke cigarettes or if you have certain conditions.  Meningococcal vaccine. You may need this if you have certain conditions.  Hepatitis A vaccine. You may need this if you have certain conditions or if you travel or work in places where you may be exposed to hepatitis A.  Hepatitis B vaccine. You may need this if you have certain conditions or if you travel or work in places where you may be exposed to hepatitis B.  Haemophilus influenzae type b (Hib) vaccine. You may need this if you have certain conditions.  Talk to your health care provider about which screenings and vaccines you need and how often you need them. This information is not intended to replace advice given to you by your health care provider. Make sure you discuss any questions you have with your health care provider. Document Released: 05/10/2015 Document Revised: 01/01/2016 Document Reviewed: 02/12/2015 Elsevier Interactive Patient Education  [employer].

## 2017-05-31 NOTE — Progress Notes (Signed)
Subjective:  I acted as a Education administrator for Dr. Charlett Blake. [place], [place]  Patient ID: Barbara Fuentes, female    DOB: 10/01/1965, 52 y.o.   MRN: [id number]  Chief Complaint  Patient presents with  . Annual Exam    HPI  Patient is in today for an annual exam and follow up on chronic medical concerns including allergies and asthma. She is following with OB/GYN Dr Si Raider for her gynecologic care. She is struggling with perimenopausal hot flashes, fatigue, brain fog and she is worried about memory loss. She is now following with allergist and getting allergy shots which have helped her congestion and asthma. These symptoms are feeling much better. Denies CP/palp/SOB/HA/congestion/fevers/GI or GU c/o. Taking meds as prescribed  Patient Care Team: [name], MD as PCP - General (Family Medicine) Brien Few, MD as Consulting Physician (Obstetrics and Gynecology) Willow Ora as Consulting Physician (Optometry) Hollar, Katharine Look, MD as Consulting Physician (Dermatology) [name], MD as Consulting Physician (General Surgery) Nicholas Lose, MD as Consulting Physician (Hematology and Oncology) Eppie Gibson, MD as Attending Physician (Radiation Oncology) Gardenia Phlegm, NP as Nurse Practitioner (Hematology and Oncology)   Past Medical History:  Diagnosis Date  . Alkaline phosphatase deficiency    29 in 9/11  . Allergic state 08/23/2015  . Arthritis    hip  . Breast cancer of lower-outer quadrant of right female breast ([facility]) 12/20/2015   right breast  . Eustachian tube dysfunction 02/2014  . H/O recurrent pneumonia 08/23/2015  . History of chicken pox 08/23/2015  . History of radiation therapy 03/05/16- 04/02/16   Right Breast treated to 40.05 Gy in 15 fractions & Right Breast boost to 10 Gy in 5 fractions.   . Low BP   . Perimenopause 08/23/2015  . Personal history of radiation therapy 2017   right breast  . Pneumonia   . Preventative health care 08/23/2015  . RAD  (reactive airway disease)     FORMER smoker , RAD with RTI's, cat or allergen exposure  . Sacral pain 2014   trauma ( fall)  . Serous otitis media 02/2014    Past Surgical History:  Procedure Laterality Date  . BREAST LUMPECTOMY Right 01/20/2016   Procedure: RIGHT BREAST LUMPECTOMY;  Surgeon: [name], MD;  Location: [facility];  Service: General;  Laterality: Right;  . BREAST LUMPECTOMY WITH RADIOACTIVE SEED LOCALIZATION Right 01/10/2016   Procedure: BREAST LUMPECTOMY WITH RADIOACTIVE SEED LOCALIZATION;  Surgeon: [name], MD;  Location: [facility];  Service: General;  Laterality: Right;  . CERVICAL FUSION  07/30/2010   & Discectomy, Dr Vertell Limber ( @ 3 levels)  . SEPTOPLASTY  1983   for septal deviation  . TONSILLECTOMY    . uterine ablation  2008   Dr Ronita Hipps  . WISDOM TOOTH EXTRACTION      Family History  Problem Relation Age of Onset  . Diabetes Father        Type II, AO  . Alcohol abuse Father   . Depression Father   . Prostate cancer Father 34  . Depression Brother   . HIV Brother        AIDS  . Heart attack Paternal Grandfather 12  . Heart disease Paternal Grandfather   . Alcohol abuse Paternal Grandfather        smoker  . Colon cancer Maternal Grandfather        dx late 18s; w/ colostomy  . COPD Paternal Grandmother   . Alcohol abuse Paternal  Grandmother        smoker  . Emphysema Paternal Grandmother   . Bronchitis Paternal Grandmother   . Heart disease Mother        cardiac rest, epinephrine with Novocaine  . Arthritis Mother   . Other Maternal Grandmother        issues w/ breast in her 90s--may have been breast cancer  . Stroke Neg Hx   . Asthma Neg Hx   . Allergic rhinitis Neg Hx   . Angioedema Neg Hx   . Eczema Neg Hx   . Immunodeficiency Neg Hx   . Urticaria Neg Hx     Social History   Socioeconomic History  . Marital status: Married    Spouse name: Not on file  . Number of children: Not on file  . Years of education: Not on file   . Highest education level: Not on file  Social Needs  . Financial resource strain: Not on file  . Food insecurity - worry: Not on file  . Food insecurity - inability: Not on file  . Transportation needs - medical: Not on file  . Transportation needs - non-medical: Not on file  Occupational History  . Occupation: Chemical engineer   Tobacco Use  . Smoking status: Former Smoker    Years: 28.00    Types: Cigarettes    Last attempt to quit: 06/18/2010    Years since quitting: 6.9  . Smokeless tobacco: Never Used  . Tobacco comment: smoked age [age] up to 1 ppd  Substance and Sexual Activity  . Alcohol use: Yes    Alcohol/week: 1.8 oz    Types: 3 Glasses of wine per week    Comment: socially/ weekly  . Drug use: No  . Sexual activity: No  Other Topics Concern  . Not on file  Social History Narrative  . Not on file    Outpatient Medications Prior to Visit  Medication Sig Dispense Refill  . Cyanocobalamin (VITAMIN B 12 PO) Take by mouth.    . EPINEPHrine (EPIPEN 2-PAK) 0.3 mg/0.3 mL IJ SOAJ injection Use as directed for severe allergic reactions 2 Device 3  . fexofenadine-pseudoephedrine (ALLEGRA-D 24) 180-240 MG 24 hr tablet Take 1 tablet by mouth daily.    . fexofenadine (ALLEGRA) 30 MG tablet Take 30 mg by mouth 2 (two) times daily.    . Calcium-Magnesium-Vitamin D (CALCIUM 500 PO) Take 1 tablet by mouth daily.    . Cholecalciferol (VITAMIN D PO) Take 2,000 Units by mouth.    . Multiple Vitamin (MULTIVITAMIN) tablet Take 1 tablet by mouth daily.    . vitamin C (ASCORBIC ACID) 250 MG tablet Take 250 mg by mouth daily.     No facility-administered medications prior to visit.     Allergies  Allergen Reactions  . Codeine     REACTION: nausea & vomiting  . Procaine Hcl     REACTION: tachycardia    Review of Systems  Constitutional: Negative for fever and malaise/fatigue.  HENT: Positive for congestion.   Eyes: Negative for blurred vision.  Respiratory: Negative for  shortness of breath.   Cardiovascular: Negative for chest pain, palpitations and leg swelling.  Gastrointestinal: Negative for abdominal pain, blood in stool and nausea.  Genitourinary: Negative for dysuria and frequency.  Musculoskeletal: Negative for falls.  Skin: Negative for rash.  Neurological: Negative for dizziness, loss of consciousness and headaches.  Endo/Heme/Allergies: Negative for environmental allergies.  Psychiatric/Behavioral: Positive for memory loss. Negative for depression. The patient is  nervous/anxious.        Objective:    Physical Exam  Constitutional: She is oriented to person, place, and time. She appears well-developed and well-nourished. No distress.  HENT:  Head: Normocephalic and atraumatic.  Nose: Nose normal.  Eyes: Right eye exhibits no discharge. Left eye exhibits no discharge.  Neck: Normal range of motion. Neck supple.  Cardiovascular: Normal rate and regular rhythm.  No murmur heard. Pulmonary/Chest: Effort normal and breath sounds normal.  Abdominal: Soft. Bowel sounds are normal. There is no tenderness.  Musculoskeletal: She exhibits no edema.  Neurological: She is alert and oriented to person, place, and time.  Skin: Skin is warm and dry.  Psychiatric: She has a normal mood and affect.  Nursing note and vitals reviewed.   BP 98/68 (BP Location: Left Arm, Patient Position: Sitting, Cuff Size: Normal)   Pulse 61   Temp 98.2 F (36.8 C) (Oral)   Resp 18   Ht 5' 7"  (1.702 m)   Wt 168 lb 9.6 oz (76.5 kg)   SpO2 98%   BMI 26.41 kg/m  Wt Readings from Last 3 Encounters:  05/31/17 168 lb 9.6 oz (76.5 kg)  08/27/16 162 lb 3.2 oz (73.6 kg)  07/20/16 163 lb (73.9 kg)   BP Readings from Last 3 Encounters:  05/31/17 98/68  08/27/16 104/70  07/20/16 104/68     Immunization History  Administered Date(s) Administered  . Influenza Split 02/23/2011  . Influenza Whole 01/07/2010  . Td 04/27/2001  . Tdap 05/25/2012    Health Maintenance    Topic Date Due  . HIV Screening  11/11/1979  . COLONOSCOPY  11/11/2014  . INFLUENZA VACCINE  01/31/2018 (Originally 11/25/2016)  . PAP SMEAR  07/26/2017  . MAMMOGRAM  12/23/2018  . TETANUS/TDAP  05/25/2022    Lab Results  Component Value Date   WBC 7.5 05/31/2017   HGB 12.5 05/31/2017   HCT 37.1 05/31/2017   PLT 327.0 05/31/2017   GLUCOSE 94 05/31/2017   CHOL 162 05/31/2017   TRIG 66.0 05/31/2017   HDL 75.40 05/31/2017   LDLCALC 74 05/31/2017   ALT 12 05/31/2017   AST 13 05/31/2017   NA 137 05/31/2017   K 4.2 05/31/2017   CL 104 05/31/2017   CREATININE 0.60 05/31/2017   BUN 9 05/31/2017   CO2 25 05/31/2017   TSH 2.99 05/31/2017   HGBA1C 5.4 08/10/2014    Lab Results  Component Value Date   TSH 2.99 05/31/2017   Lab Results  Component Value Date   WBC 7.5 05/31/2017   HGB 12.5 05/31/2017   HCT 37.1 05/31/2017   MCV 89.4 05/31/2017   PLT 327.0 05/31/2017   Lab Results  Component Value Date   NA 137 05/31/2017   K 4.2 05/31/2017   CHLORIDE 106 12/25/2015   CO2 25 05/31/2017   GLUCOSE 94 05/31/2017   BUN 9 05/31/2017   CREATININE 0.60 05/31/2017   BILITOT 0.5 05/31/2017   ALKPHOS 33 (L) 05/31/2017   AST 13 05/31/2017   ALT 12 05/31/2017   PROT 6.8 05/31/2017   ALBUMIN 3.9 05/31/2017   CALCIUM 8.9 05/31/2017   ANIONGAP 10 12/25/2015   EGFR >90 12/25/2015   GFR 111.34 05/31/2017   Lab Results  Component Value Date   CHOL 162 05/31/2017   Lab Results  Component Value Date   HDL 75.40 05/31/2017   Lab Results  Component Value Date   LDLCALC 74 05/31/2017   Lab Results  Component Value Date  TRIG 66.0 05/31/2017   Lab Results  Component Value Date   CHOLHDL 2 05/31/2017   Lab Results  Component Value Date   HGBA1C 5.4 08/10/2014         Assessment & Plan:   Problem List Items Addressed This Visit    Preventative health care    Patient encouraged to maintain heart healthy diet, regular exercise, adequate sleep. Consider daily  probiotics. Take medications as prescribed      Relevant Orders   CBC (Completed)   Comprehensive metabolic panel (Completed)   Lipid panel (Completed)   TSH (Completed)   Perimenopause    Encouraged adequate sleep, exercise, hydration, heart healthy diet with high protein and minimal carbs.       Family history of colon cancer    She agreed to referral to gastroenterology for colonoscopy      Other allergic rhinitis    Is following with an allergist and is taking Allegra. Dr Glenetta Hew is her allergist. Allergy shots       Other Visit Diagnoses    Colon cancer screening    -  Primary   Relevant Orders   Ambulatory referral to Gastroenterology      I have discontinued Shanielle H. Gacek's multivitamin, vitamin C, Cholecalciferol (VITAMIN D PO), Calcium-Magnesium-Vitamin D (CALCIUM 500 PO), and fexofenadine. I am also having her maintain her Cyanocobalamin (VITAMIN B 12 PO), EPINEPHrine, and fexofenadine-pseudoephedrine.  No orders of the defined types were placed in this encounter.   CMA served as Education administrator during this visit. History, Physical and Plan performed by medical provider. Documentation and orders reviewed and attested to.  [name], MD

## 2017-05-31 NOTE — Assessment & Plan Note (Addendum)
Is following with an allergist and is taking Allegra. Dr Glenetta Hew is her allergist. Allergy shots

## 2017-06-01 ENCOUNTER — Encounter: Payer: Self-pay | Admitting: Gastroenterology

## 2017-06-02 ENCOUNTER — Ambulatory Visit (INDEPENDENT_AMBULATORY_CARE_PROVIDER_SITE_OTHER): Payer: BLUE CROSS/BLUE SHIELD

## 2017-06-02 DIAGNOSIS — J309 Allergic rhinitis, unspecified: Secondary | ICD-10-CM | POA: Diagnosis not present

## 2017-06-06 NOTE — Assessment & Plan Note (Signed)
She agreed to referral to gastroenterology for colonoscopy

## 2017-06-06 NOTE — Assessment & Plan Note (Signed)
Encouraged adequate sleep, exercise, hydration, heart healthy diet with high protein and minimal carbs.

## 2017-06-08 ENCOUNTER — Ambulatory Visit (INDEPENDENT_AMBULATORY_CARE_PROVIDER_SITE_OTHER): Payer: BLUE CROSS/BLUE SHIELD

## 2017-06-08 DIAGNOSIS — J309 Allergic rhinitis, unspecified: Secondary | ICD-10-CM

## 2017-06-14 ENCOUNTER — Ambulatory Visit (INDEPENDENT_AMBULATORY_CARE_PROVIDER_SITE_OTHER): Payer: BLUE CROSS/BLUE SHIELD

## 2017-06-14 DIAGNOSIS — J309 Allergic rhinitis, unspecified: Secondary | ICD-10-CM

## 2017-06-22 ENCOUNTER — Ambulatory Visit (INDEPENDENT_AMBULATORY_CARE_PROVIDER_SITE_OTHER): Payer: BLUE CROSS/BLUE SHIELD

## 2017-06-22 DIAGNOSIS — J309 Allergic rhinitis, unspecified: Secondary | ICD-10-CM | POA: Diagnosis not present

## 2017-06-28 ENCOUNTER — Ambulatory Visit (INDEPENDENT_AMBULATORY_CARE_PROVIDER_SITE_OTHER): Payer: BLUE CROSS/BLUE SHIELD

## 2017-06-28 DIAGNOSIS — J309 Allergic rhinitis, unspecified: Secondary | ICD-10-CM

## 2017-07-05 ENCOUNTER — Ambulatory Visit (INDEPENDENT_AMBULATORY_CARE_PROVIDER_SITE_OTHER): Payer: BLUE CROSS/BLUE SHIELD

## 2017-07-05 DIAGNOSIS — J309 Allergic rhinitis, unspecified: Secondary | ICD-10-CM

## 2017-07-07 DIAGNOSIS — J301 Allergic rhinitis due to pollen: Secondary | ICD-10-CM | POA: Diagnosis not present

## 2017-07-07 NOTE — Progress Notes (Signed)
EXP. 07/08/18

## 2017-07-09 ENCOUNTER — Ambulatory Visit (AMBULATORY_SURGERY_CENTER): Payer: Self-pay

## 2017-07-09 ENCOUNTER — Encounter: Payer: Self-pay | Admitting: Gastroenterology

## 2017-07-09 ENCOUNTER — Telehealth: Payer: Self-pay | Admitting: *Deleted

## 2017-07-09 VITALS — Ht 67.0 in | Wt 166.0 lb

## 2017-07-09 DIAGNOSIS — Z1211 Encounter for screening for malignant neoplasm of colon: Secondary | ICD-10-CM

## 2017-07-09 MED ORDER — NA SULFATE-K SULFATE-MG SULF 17.5-3.13-1.6 GM/177ML PO SOLN: 1.0000 | Freq: Once | ORAL | 0 refills | Status: AC

## 2017-07-09 NOTE — Progress Notes (Signed)
Per pt, no allergies to soy or egg products.Pt not taking any weight loss meds or using  O2 at home.  Pt refused emmi video. 

## 2017-07-09 NOTE — Telephone Encounter (Signed)
Received call from the [facility] that pt is wanting to know if we have received any shingrix vaccine. Advised them to let pt know that since she is a new start (never had the 1st one) it will be sometime before we have any available. Med still on manufacturer backorder and we are getting a very limited supply. Current supply we receive is reserved for pt's that still need the 2nd vaccine in the series. Advised them to have pt check with different pharmacies as she may have better chances that way.

## 2017-07-14 ENCOUNTER — Ambulatory Visit (INDEPENDENT_AMBULATORY_CARE_PROVIDER_SITE_OTHER): Payer: BLUE CROSS/BLUE SHIELD | Admitting: *Deleted

## 2017-07-14 DIAGNOSIS — J309 Allergic rhinitis, unspecified: Secondary | ICD-10-CM | POA: Diagnosis not present

## 2017-07-20 ENCOUNTER — Ambulatory Visit (INDEPENDENT_AMBULATORY_CARE_PROVIDER_SITE_OTHER): Payer: BLUE CROSS/BLUE SHIELD

## 2017-07-20 DIAGNOSIS — J309 Allergic rhinitis, unspecified: Secondary | ICD-10-CM | POA: Diagnosis not present

## 2017-07-23 ENCOUNTER — Other Ambulatory Visit: Payer: Self-pay

## 2017-07-23 ENCOUNTER — Ambulatory Visit (AMBULATORY_SURGERY_CENTER): Payer: BLUE CROSS/BLUE SHIELD | Admitting: Gastroenterology

## 2017-07-23 ENCOUNTER — Encounter: Payer: Self-pay | Admitting: Gastroenterology

## 2017-07-23 VITALS — BP 103/54 | HR 74 | Temp 98.9°F | Resp 22 | Ht 67.0 in | Wt 168.0 lb

## 2017-07-23 DIAGNOSIS — D128 Benign neoplasm of rectum: Secondary | ICD-10-CM | POA: Diagnosis not present

## 2017-07-23 DIAGNOSIS — Z1212 Encounter for screening for malignant neoplasm of rectum: Secondary | ICD-10-CM | POA: Diagnosis not present

## 2017-07-23 DIAGNOSIS — D123 Benign neoplasm of transverse colon: Secondary | ICD-10-CM

## 2017-07-23 DIAGNOSIS — D126 Benign neoplasm of colon, unspecified: Secondary | ICD-10-CM | POA: Diagnosis not present

## 2017-07-23 DIAGNOSIS — Z1211 Encounter for screening for malignant neoplasm of colon: Secondary | ICD-10-CM

## 2017-07-23 DIAGNOSIS — D129 Benign neoplasm of anus and anal canal: Secondary | ICD-10-CM

## 2017-07-23 DIAGNOSIS — K621 Rectal polyp: Secondary | ICD-10-CM

## 2017-07-23 DIAGNOSIS — K635 Polyp of colon: Secondary | ICD-10-CM

## 2017-07-23 DIAGNOSIS — D120 Benign neoplasm of cecum: Secondary | ICD-10-CM | POA: Diagnosis not present

## 2017-07-23 HISTORY — PX: COLONOSCOPY: SHX174

## 2017-07-23 MED ORDER — SODIUM CHLORIDE 0.9 % IV SOLN: 500.0000 mL | Freq: Once | INTRAVENOUS | Status: DC

## 2017-07-23 NOTE — Progress Notes (Signed)
Pt's states no medical or surgical changes since previsit or office visit. 

## 2017-07-23 NOTE — Patient Instructions (Signed)
YOU HAD AN ENDOSCOPIC PROCEDURE TODAY AT THE [facility]:   Refer to the procedure report that was given to you for any specific questions about what was found during the examination.  If the procedure report does not answer your questions, please call your gastroenterologist to clarify.  If you requested that your care partner not be given the details of your procedure findings, then the procedure report has been included in a sealed envelope for you to review at your convenience later.  YOU SHOULD EXPECT: Some feelings of bloating in the abdomen. Passage of more gas than usual.  Walking can help get rid of the air that was put into your GI tract during the procedure and reduce the bloating. If you had a lower endoscopy (such as a colonoscopy or flexible sigmoidoscopy) you may notice spotting of blood in your stool or on the toilet paper. If you underwent a bowel prep for your procedure, you may not have a normal bowel movement for a few days.  Please Note:  You might notice some irritation and congestion in your nose or some drainage.  This is from the oxygen used during your procedure.  There is no need for concern and it should clear up in a day or so.  SYMPTOMS TO REPORT IMMEDIATELY:   Following lower endoscopy (colonoscopy or flexible sigmoidoscopy):  Excessive amounts of blood in the stool  Significant tenderness or worsening of abdominal pains  Swelling of the abdomen that is new, acute  Fever of 100F or higher   For urgent or emergent issues, a gastroenterologist can be reached at any hour by calling [phone].   DIET:  We do recommend a small meal at first, but then you may proceed to your regular diet.  Drink plenty of fluids but you should avoid alcoholic beverages for 24 hours.  ACTIVITY:  You should plan to take it easy for the rest of today and you should NOT DRIVE or use heavy machinery until tomorrow (because of the sedation medicines used during the test).     FOLLOW UP: Our staff will call the number listed on your records the next business day following your procedure to check on you and address any questions or concerns that you may have regarding the information given to you following your procedure. If we do not reach you, we will leave a message.  However, if you are feeling well and you are not experiencing any problems, there is no need to return our call.  We will assume that you have returned to your regular daily activities without incident.  If any biopsies were taken you will be contacted by phone or by letter within the next 1-3 weeks.  Please call us at [phone] if you have not heard about the biopsies in 3 weeks.    SIGNATURES/CONFIDENTIALITY: You and/or your care partner have signed paperwork which will be entered into your electronic medical record.  These signatures attest to the fact that that the information above on your After Visit Summary has been reviewed and is understood.  Full responsibility of the confidentiality of this discharge information lies with you and/or your care-partner.  Read all of the handouts given to you by your recovery room nurse. 

## 2017-07-23 NOTE — Progress Notes (Signed)
Report to PACU, RN, vss, BBS= Clear.  

## 2017-07-23 NOTE — Op Note (Signed)
[facility] Patient Name: Barbara Fuentes Procedure Date: 07/23/2017 11:41 AM MRN: [id number] Endoscopist: Remo Lipps P. Armbruster MD, MD Age: 52 Referring MD:  Date of Birth: 1965/09/14 Gender: Female Account #: [id number] Procedure:                Colonoscopy Indications:              Screening for colorectal malignant neoplasm, This                            is the patient's first colonoscopy Medicines:                Monitored Anesthesia Care Procedure:                Pre-Anesthesia Assessment:                           - Prior to the procedure, a History and Physical                            was performed, and patient medications and                            allergies were reviewed. The patient's tolerance of                            previous anesthesia was also reviewed. The risks                            and benefits of the procedure and the sedation                            options and risks were discussed with the patient.                            All questions were answered, and informed consent                            was obtained. Prior Anticoagulants: The patient has                            taken no previous anticoagulant or antiplatelet                            agents. ASA Grade Assessment: II - A patient with                            mild systemic disease. After reviewing the risks                            and benefits, the patient was deemed in                            satisfactory condition to undergo the procedure.  After obtaining informed consent, the colonoscope                            was passed under direct vision. Throughout the                            procedure, the patient's blood pressure, pulse, and                            oxygen saturations were monitored continuously. The                            Model PCF-[id number]) scope was introduced                            through the anus  and advanced to the the cecum,                            identified by appendiceal orifice and ileocecal                            valve. The colonoscopy was performed without                            difficulty. The patient tolerated the procedure                            well. The quality of the bowel preparation was                            good. The ileocecal valve, appendiceal orifice, and                            rectum were photographed. Scope In: 11:52:51 AM Scope Out: 12:27:37 PM Scope Withdrawal Time: 0 hours 29 minutes 1 second  Total Procedure Duration: 0 hours 34 minutes 46 seconds  Findings:                 The perianal and digital rectal examinations were                            normal.                           The colon was tortuous.                           A 8 mm polyp was found in the cecum. The polyp was                            sessile. The polyp was removed with a cold snare.                            Resection and retrieval were complete.  A 8 mm polyp was found in the transverse colon. The                            polyp was sessile. It was technically challenging                            to get good position for polypectomy in this area                            due to looping / tortousity, which prolonged the                            procedure. The polyp was removed with a cold snare.                            Resection and retrieval were complete.                           Two sessile polyps were found in the rectum. The                            polyps were 3 to 4 mm in size. These polyps were                            removed with a cold snare. Resection and retrieval                            were complete.                           A 4 mm polyp was found in the rectum. The polyp was                            pedunculated. The polyp was removed with a hot                            snare. Resection and  retrieval were complete.                           The exam was otherwise without abnormality on                            direct and retroflexion views. Complications:            No immediate complications. Estimated blood loss:                            Minimal. Estimated Blood Loss:     Estimated blood loss was minimal. Impression:               - Tortuous colon.                           - One 8 mm polyp in  the cecum, removed with a cold                            snare. Resected and retrieved.                           - One 8 mm polyp in the transverse colon, removed                            with a cold snare. Resected and retrieved.                           - Two 3 to 4 mm polyps in the rectum, removed with                            a cold snare. Resected and retrieved.                           - One 4 mm polyp in the rectum, removed with a hot                            snare. Resected and retrieved.                           - The examination was otherwise normal on direct                            and retroflexion views. Recommendation:           - Patient has a contact number available for                            emergencies. The signs and symptoms of potential                            delayed complications were discussed with the                            patient. Return to normal activities tomorrow.                            Written discharge instructions were provided to the                            patient.                           - Resume previous diet.                           - Continue present medications.                           - Await pathology results.                           -  Repeat colonoscopy is recommended for                            surveillance. The colonoscopy date will be                            determined after pathology results from today's                            exam become available for review. Remo Lipps P. Armbruster  MD, MD 07/23/2017 12:33:07 PM This report has been signed electronically.

## 2017-07-23 NOTE — Progress Notes (Signed)
Called to room to assist during endoscopic procedure.  Patient ID and intended procedure confirmed with present staff. Received instructions for my participation in the procedure from the performing physician.  

## 2017-07-26 ENCOUNTER — Telehealth: Payer: Self-pay

## 2017-07-26 ENCOUNTER — Ambulatory Visit (INDEPENDENT_AMBULATORY_CARE_PROVIDER_SITE_OTHER): Payer: BLUE CROSS/BLUE SHIELD | Admitting: *Deleted

## 2017-07-26 DIAGNOSIS — J309 Allergic rhinitis, unspecified: Secondary | ICD-10-CM

## 2017-07-26 NOTE — Telephone Encounter (Signed)
  Follow up Call-  Call back number 07/23/2017  Post procedure Call Back phone  # [phone]  Permission to leave phone message Yes  Some recent data might be hidden     Left message

## 2017-07-26 NOTE — Telephone Encounter (Signed)
  Follow up Call-  Call back number 07/23/2017  Post procedure Call Back phone  # [phone]  Permission to leave phone message Yes  Some recent data might be hidden     Patient questions:  Do you have a fever, pain , or abdominal swelling? No. Pain Score  0 *  Have you tolerated food without any problems? Yes.    Have you been able to return to your normal activities? Yes.    Do you have any questions about your discharge instructions: Diet   No. Medications  No. Follow up visit  No.  Do you have questions or concerns about your Care? No.  Actions: * If pain score is 4 or above: No action needed, pain <4.

## 2017-07-28 ENCOUNTER — Encounter: Payer: Self-pay | Admitting: Gastroenterology

## 2017-08-02 ENCOUNTER — Inpatient Hospital Stay: Payer: BLUE CROSS/BLUE SHIELD | Attending: Adult Health | Admitting: Adult Health

## 2017-08-02 ENCOUNTER — Encounter: Payer: Self-pay | Admitting: Adult Health

## 2017-08-02 VITALS — BP 118/67 | HR 95 | Temp 97.9°F | Resp 19 | Ht 67.0 in | Wt 162.5 lb

## 2017-08-02 DIAGNOSIS — Z171 Estrogen receptor negative status [ER-]: Secondary | ICD-10-CM

## 2017-08-02 DIAGNOSIS — Z86000 Personal history of in-situ neoplasm of breast: Secondary | ICD-10-CM | POA: Diagnosis not present

## 2017-08-02 DIAGNOSIS — C50511 Malignant neoplasm of lower-outer quadrant of right female breast: Secondary | ICD-10-CM

## 2017-08-02 NOTE — Assessment & Plan Note (Signed)
12/19/2015: Right breast DCIS with calcifications, complex sclerosing lesion, ER 0%, PR 0%, Tis N0 stage 0 Right breast excision 01/20/2016: UDH, fibrocystic changes; right posterior excision: Benign, no evidence of carcinoma Adj XRT 03/05/16 to 04/02/16  Plan: observation surveillance since the initial DCIS was ER/PR negative.  Barbara Fuentes will follow with her gynecologist, Dr. Ronita Hipps annually for mammograms and breast exams.  She is happy with this approach and I gave her a pink graduation tassel to congratulate her on the next step in her journey.

## 2017-08-02 NOTE — Progress Notes (Signed)
[facility] Follow up:    Barbara Lukes, MD [name] [id number]   DIAGNOSIS: Cancer Staging Malignant neoplasm of lower-outer quadrant of right breast of female, estrogen receptor negative (Barbara Fuentes) Staging form: Breast, AJCC 7th Edition - Clinical stage from 12/25/2015: Stage 0 (Tis (DCIS), N0, M0) - Unsigned   SUMMARY OF ONCOLOGIC HISTORY:   Malignant neoplasm of lower-outer quadrant of right breast of female, estrogen receptor negative (Barbara Fuentes)   12/18/2015 Mammogram    Right breast: 2 cm group of suspicious calcifications within the outer right breast      12/19/2015 Initial Diagnosis    Right breast DCIS with calcifications, complex sclerosing lesion, ER 0%, PR 0%, Tis N0 stage 0      12/26/2015 Genetic Testing    Genetic testing was normal, and did not reveal a deleterious mutation..  Additionally, no variants of uncertain significance (VUSes) were found. Genes tested include: ATM, BARD1, BRCA1, BRCA2, BRIP1, CDH1, CHEK2, FANCC, MLH1, MSH2, MSH6, NBN, PALB2, PMS2, PTEN, RAD51C, RAD51D, TP53, and XRCC2.  This panel also includes deletion/duplication analysis (without sequencing) for one gene, EPCAM.      01/10/2016 Surgery    Right Lumpectomy Lucia Gaskins): DCIS with necrosis and calcs, high grade, 3cm, fibrocystic changes, complex sclerosing lesion, medial and lateral margins +,       01/20/2016 Surgery    Right breast excision: UDH, fibrocystic changes; right posterior excision: Benign, no evidence of carcinoma      03/05/2016 - 04/02/2016 Radiation Therapy    Isidore Moos) 1. The Right breast was treated to 40.05 Gy in 15 fractions at 2.67 Gy per fraction. 2. The Right breast was boosted to 10 Gy in 5 fractions at 2 Gy per fraction.        CURRENT THERAPY: observation   INTERVAL HISTORY: Barbara Fuentes 52 y.o. female returns for follow up of her h/o breast cancer.  She is doing well today.  She denies any issues with her breasts.  She  wants to know if she can stop her routine appointments with [place] since she is receiving her annual mammograms and following with Dr. Ronita Hipps for her routine check up and breast exam.    Patient Active Problem List   Diagnosis Date Noted  . Other allergic rhinitis 07/20/2016  . 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 Genetic testing 02/24/2016  . Family history of colon cancer 12/27/2015  . Malignant neoplasm of lower-outer quadrant of right breast of female, estrogen receptor negative (Barbara Fuentes) 12/20/2015  . Preventative health care 08/23/2015  . Perimenopause 08/23/2015  . History of chicken pox 08/23/2015  . H/O recurrent pneumonia 08/23/2015  . Allergic state 08/23/2015  . 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 RAD (reactive airway disease) 05/25/2012  . Other irritable bowel syndrome 10/15/2008    is allergic to codeine and procaine hcl.  MEDICAL HISTORY: Past Medical History:  Diagnosis Date  . Alkaline phosphatase deficiency    29 in 9/11  . Allergic state 08/23/2015  . Arthritis    hip  . Breast cancer of lower-outer quadrant of right female breast (Barbara Fuentes) 12/20/2015   right breast  . Eustachian tube dysfunction 02/2014  . H/O recurrent pneumonia 08/23/2015  . History of chicken pox 08/23/2015  . History of radiation therapy 03/05/16- 04/02/16   Right Breast treated to 40.05 Gy in 15 fractions & Right Breast boost to 10 Gy in 5 fractions.   . Low BP   .  Perimenopause 08/23/2015  . Personal history of radiation therapy 2017   right breast/ had 20 radiation sessions  . Pneumonia   . Preventative health care 08/23/2015  . RAD (reactive airway disease)     FORMER smoker , RAD with RTI's, cat or allergen exposure  . Sacral pain 2014   trauma ( fall)  . Serous otitis media 02/2014    SURGICAL HISTORY: Past Surgical History:  Procedure Laterality Date  . BREAST LUMPECTOMY Right 01/20/2016   Procedure: RIGHT BREAST LUMPECTOMY;  Surgeon: [name], MD;   Location: [facility];  Service: General;  Laterality: Right;  . BREAST LUMPECTOMY WITH RADIOACTIVE SEED LOCALIZATION Right 01/10/2016   Procedure: BREAST LUMPECTOMY WITH RADIOACTIVE SEED LOCALIZATION;  Surgeon: [name], MD;  Location: [facility];  Service: General;  Laterality: Right;  . CERVICAL FUSION  07/30/2010   & Discectomy, Dr Vertell Limber ( @ 3 levels)/has plate and 6 screws  . SEPTOPLASTY  1983   for septal deviation  . TONSILLECTOMY    . uterine ablation  2008   Dr Ronita Hipps  . WISDOM TOOTH EXTRACTION      SOCIAL HISTORY: Social History   Socioeconomic History  . Marital status: Married    Spouse name: Not on file  . Number of children: Not on file  . Years of education: Not on file  . Highest education level: Not on file  Occupational History  . Occupation: Chemical engineer   Social Needs  . Financial resource strain: Not on file  . Food insecurity:    Worry: Not on file    Inability: Not on file  . Transportation needs:    Medical: Not on file    Non-medical: Not on file  Tobacco Use  . Smoking status: Former Smoker    Years: 28.00    Types: Cigarettes    Last attempt to quit: 06/18/2010    Years since quitting: 7.1  . Smokeless tobacco: Never Used  . Tobacco comment: smoked age [age] up to 1 ppd  Substance and Sexual Activity  . Alcohol use: Yes    Alcohol/week: 1.8 - 2.4 oz    Types: 3 - 4 Glasses of wine per week    Comment: socially/ weekly  . Drug use: No  . Sexual activity: Never  Lifestyle  . Physical activity:    Days per week: Not on file    Minutes per session: Not on file  . Stress: Not on file  Relationships  . Social connections:    Talks on phone: Not on file    Gets together: Not on file    Attends religious service: Not on file    Active member of club or organization: Not on file    Attends meetings of clubs or organizations: Not on file    Relationship status: Not on file  . Intimate partner violence:    Fear of current or  ex partner: Not on file    Emotionally abused: Not on file    Physically abused: Not on file    Forced sexual activity: Not on file  Other Topics Concern  . Not on file  Social History Narrative  . Not on file    FAMILY HISTORY: Family History  Problem Relation Age of Onset  . Diabetes Father        Type II, AO  . Alcohol abuse Father   . Depression Father   . Prostate cancer Father 25  . Depression Brother   . HIV  Brother        AIDS  . Heart attack Paternal Grandfather 94  . Heart disease Paternal Grandfather   . Alcohol abuse Paternal Grandfather        smoker  . Colon cancer Maternal Grandfather        dx late 27s; w/ colostomy  . COPD Paternal Grandmother   . Alcohol abuse Paternal Grandmother        smoker  . Emphysema Paternal Grandmother   . Bronchitis Paternal Grandmother   . Heart disease Mother        cardiac rest, epinephrine with Novocaine  . Arthritis Mother   . Other Maternal Grandmother        issues w/ breast in her 90s--may have been breast cancer  . Stroke Neg Hx   . Asthma Neg Hx   . Allergic rhinitis Neg Hx   . Angioedema Neg Hx   . Eczema Neg Hx   . Immunodeficiency Neg Hx   . Urticaria Neg Hx     Review of Systems  Constitutional: Negative for appetite change, chills, fatigue, fever and unexpected weight change.  HENT:   Negative for hearing loss, lump/mass, sore throat and trouble swallowing.   Eyes: Negative for eye problems and icterus.  Respiratory: Negative for chest tightness, cough and shortness of breath.   Cardiovascular: Negative for chest pain, leg swelling and palpitations.  Gastrointestinal: Negative for abdominal distention, abdominal pain, constipation, diarrhea, nausea and vomiting.  Endocrine: Negative for hot flashes.  Skin: Negative for itching and rash.  Neurological: Negative for dizziness, extremity weakness, headaches and numbness.  Hematological: Negative for adenopathy. Does not bruise/bleed easily.   Psychiatric/Behavioral: Negative for depression. The patient is not nervous/anxious.       PHYSICAL EXAMINATION  ECOG PERFORMANCE STATUS: 0 - Asymptomatic  Vitals:   08/02/17 1049  BP: 118/67  Pulse: 95  Resp: 19  Temp: 97.9 F (36.6 C)  SpO2: 100%    Physical Exam  Constitutional: She is oriented to person, place, and time and well-developed, well-nourished, and in no distress.  HENT:  Head: Normocephalic and atraumatic.  Mouth/Throat: Oropharynx is clear and moist. No oropharyngeal exudate.  Eyes: Pupils are equal, round, and reactive to light. No scleral icterus.  Neck: Neck supple.  Cardiovascular: Normal rate, regular rhythm and normal heart sounds.  Pulmonary/Chest: Effort normal and breath sounds normal. No respiratory distress. She has no wheezes. She has no rales.  Right breast s/p lumpectomy, no nodularity noted, no masses, skin changes or nipple changes, left breast without nodules, masses, skin or nipple changes.  Abdominal: Soft. Bowel sounds are normal. She exhibits no distension and no mass. There is no tenderness. There is no rebound and no guarding.  Musculoskeletal: She exhibits no edema.  Lymphadenopathy:    She has no cervical adenopathy.  Neurological: She is alert and oriented to person, place, and time.  Skin: Skin is warm and dry. No rash noted.  Psychiatric: Mood and affect normal.    LABORATORY DATA:  CBC    Component Value Date/Time   WBC 7.5 05/31/2017 1014   RBC 4.15 05/31/2017 1014   HGB 12.5 05/31/2017 1014   HGB 13.2 12/25/2015 0830   HCT 37.1 05/31/2017 1014   HCT 39.4 12/25/2015 0830   PLT 327.0 05/31/2017 1014   PLT 323 12/25/2015 0830   MCV 89.4 05/31/2017 1014   MCV 90.8 12/25/2015 0830   MCH 30.5 12/25/2015 0830   MCH 30.2 07/28/2010 1056   MCHC 33.6  05/31/2017 1014   RDW 13.3 05/31/2017 1014   RDW 13.3 12/25/2015 0830   LYMPHSABS 2.3 12/25/2015 0830   MONOABS 0.6 12/25/2015 0830   EOSABS 0.2 12/25/2015 0830    BASOSABS 0.1 12/25/2015 0830    CMP     Component Value Date/Time   NA 137 05/31/2017 1014   NA 139 12/25/2015 0830   K 4.2 05/31/2017 1014   K 4.2 12/25/2015 0830   CL 104 05/31/2017 1014   CO2 25 05/31/2017 1014   CO2 23 12/25/2015 0830   GLUCOSE 94 05/31/2017 1014   GLUCOSE 87 12/25/2015 0830   BUN 9 05/31/2017 1014   BUN 10.5 12/25/2015 0830   CREATININE 0.60 05/31/2017 1014   CREATININE 0.7 12/25/2015 0830   CALCIUM 8.9 05/31/2017 1014   CALCIUM 9.5 12/25/2015 0830   PROT 6.8 05/31/2017 1014   PROT 7.1 12/25/2015 0830   ALBUMIN 3.9 05/31/2017 1014   ALBUMIN 3.8 12/25/2015 0830   AST 13 05/31/2017 1014   AST 19 12/25/2015 0830   ALT 12 05/31/2017 1014   ALT 20 12/25/2015 0830   ALKPHOS 33 (L) 05/31/2017 1014   ALKPHOS 32 (L) 12/25/2015 0830   BILITOT 0.5 05/31/2017 1014   BILITOT 0.34 12/25/2015 0830   GFRNONAA 90.14 01/08/2010 0814   GFRAA 118 10/13/2007 0000     ASSESSMENT and PLAN:   Malignant neoplasm of lower-outer quadrant of right breast of female, estrogen receptor negative ([place]) 12/19/2015: Right breast DCIS with calcifications, complex sclerosing lesion, ER 0%, PR 0%, Tis N0 stage 0 Right breast excision 01/20/2016: UDH, fibrocystic changes; right posterior excision: Benign, no evidence of carcinoma Adj XRT 03/05/16 to 04/02/16  Plan: observation surveillance since the initial DCIS was ER/PR negative.  Ronnika will follow with her gynecologist, Dr. Ronita Hipps annually for mammograms and breast exams.  She is happy with this approach and I gave her a pink graduation tassel to congratulate her on the next step in her journey.     All questions were answered. The patient knows to call the clinic with any problems, questions or concerns. We can certainly see the patient if needed.  A total of (20) minutes of face-to-face time was spent with this patient with greater than 50% of that time in counseling and care-coordination.  This note was electronically  signed. Scot Dock, NP 08/02/2017

## 2017-08-03 ENCOUNTER — Ambulatory Visit (INDEPENDENT_AMBULATORY_CARE_PROVIDER_SITE_OTHER): Payer: BLUE CROSS/BLUE SHIELD

## 2017-08-03 ENCOUNTER — Telehealth: Payer: Self-pay | Admitting: Adult Health

## 2017-08-03 DIAGNOSIS — J309 Allergic rhinitis, unspecified: Secondary | ICD-10-CM | POA: Diagnosis not present

## 2017-08-03 NOTE — Telephone Encounter (Signed)
Per 4/8 no los °

## 2017-08-11 ENCOUNTER — Ambulatory Visit (INDEPENDENT_AMBULATORY_CARE_PROVIDER_SITE_OTHER): Payer: BLUE CROSS/BLUE SHIELD

## 2017-08-11 DIAGNOSIS — J309 Allergic rhinitis, unspecified: Secondary | ICD-10-CM | POA: Diagnosis not present

## 2017-08-16 ENCOUNTER — Ambulatory Visit (INDEPENDENT_AMBULATORY_CARE_PROVIDER_SITE_OTHER): Payer: BLUE CROSS/BLUE SHIELD

## 2017-08-16 DIAGNOSIS — J309 Allergic rhinitis, unspecified: Secondary | ICD-10-CM | POA: Diagnosis not present

## 2017-08-25 ENCOUNTER — Ambulatory Visit (INDEPENDENT_AMBULATORY_CARE_PROVIDER_SITE_OTHER): Payer: BLUE CROSS/BLUE SHIELD

## 2017-08-25 DIAGNOSIS — J309 Allergic rhinitis, unspecified: Secondary | ICD-10-CM

## 2017-08-31 ENCOUNTER — Ambulatory Visit (INDEPENDENT_AMBULATORY_CARE_PROVIDER_SITE_OTHER): Payer: BLUE CROSS/BLUE SHIELD

## 2017-08-31 DIAGNOSIS — J309 Allergic rhinitis, unspecified: Secondary | ICD-10-CM | POA: Diagnosis not present

## 2017-09-01 ENCOUNTER — Ambulatory Visit (HOSPITAL_BASED_OUTPATIENT_CLINIC_OR_DEPARTMENT_OTHER)
Admission: RE | Admit: 2017-09-01 | Discharge: 2017-09-01 | Disposition: A | Discharge status: Home / Self Care | Payer: BLUE CROSS/BLUE SHIELD | Source: Ambulatory Visit | Attending: Medical | Admitting: Medical

## 2017-09-01 ENCOUNTER — Encounter: Payer: Self-pay | Admitting: Medical

## 2017-09-01 ENCOUNTER — Ambulatory Visit: Payer: BLUE CROSS/BLUE SHIELD | Admitting: Medical

## 2017-09-01 VITALS — BP 103/65 | HR 59 | Temp 98.3°F | Resp 16 | Ht 67.0 in | Wt 162.4 lb

## 2017-09-01 DIAGNOSIS — M545 Low back pain, unspecified: Secondary | ICD-10-CM

## 2017-09-01 MED ORDER — KETOROLAC TROMETHAMINE 60 MG/2ML IM SOLN
60.0000 mg | Freq: Once | INTRAMUSCULAR | Status: AC
  Administered 2017-09-01: 60 mg via INTRAMUSCULAR

## 2017-09-01 MED ORDER — MELOXICAM 7.5 MG PO TABS: ORAL_TABLET | ORAL | 0 refills | Status: DC

## 2017-09-01 MED ORDER — CYCLOBENZAPRINE HCL 10 MG PO TABS: 10.0000 mg | ORAL_TABLET | Freq: Every day | ORAL | 0 refills | Status: DC

## 2017-09-01 NOTE — Progress Notes (Signed)
Subjective:    Patient ID: Barbara Fuentes, female    DOB: 07-Oct-1965, 52 y.o.   MRN: [id number]  HPI   Pt in with some back pain. She states was doing ok until she dropped a receipt on the floor yesterday. She bent over and felt extreme sharp pain. Pain direct mid line. No radiating. No surgery on lower back before.  No history of any chronic low  back pain. Very rare occasional back pain. Very mild and self limiting if she ever got in past.  Minimal change in position has extreme pain.  No leg weakness, no incontinence and no saddle anesthesia.  Pt tried some ibuprofen but did not help.     Review of Systems  Constitutional: Negative for chills, fatigue and fever.  Respiratory: Negative for cough, chest tightness and wheezing.   Cardiovascular: Negative for chest pain and palpitations.  Musculoskeletal: Positive for back pain. Negative for gait problem, myalgias and neck stiffness.  Skin: Negative for rash.  Neurological: Negative for dizziness, seizures and headaches.  Hematological: Negative for adenopathy. Does not bruise/bleed easily.  Psychiatric/Behavioral: Negative for behavioral problems and confusion.    Past Medical History:  Diagnosis Date  . Alkaline phosphatase deficiency    29 in 9/11  . Allergic state 08/23/2015  . Arthritis    hip  . Breast cancer of lower-outer quadrant of right female breast ([place]) 12/20/2015   right breast  . Eustachian tube dysfunction 02/2014  . H/O recurrent pneumonia 08/23/2015  . History of chicken pox 08/23/2015  . History of radiation therapy 03/05/16- 04/02/16   Right Breast treated to 40.05 Gy in 15 fractions & Right Breast boost to 10 Gy in 5 fractions.   . Low BP   . Perimenopause 08/23/2015  . Personal history of radiation therapy 2017   right breast/ had 20 radiation sessions  . Pneumonia   . Preventative health care 08/23/2015  . RAD (reactive airway disease)     FORMER smoker , RAD with RTI's, cat or allergen exposure    . Sacral pain 2014   trauma ( fall)  . Serous otitis media 02/2014     Social History   Socioeconomic History  . Marital status: Married    Spouse name: Not on file  . Number of children: Not on file  . Years of education: Not on file  . Highest education level: Not on file  Occupational History  . Occupation: Chemical engineer   Social Needs  . Financial resource strain: Not on file  . Food insecurity:    Worry: Not on file    Inability: Not on file  . Transportation needs:    Medical: Not on file    Non-medical: Not on file  Tobacco Use  . Smoking status: Former Smoker    Years: 28.00    Types: Cigarettes    Last attempt to quit: 06/18/2010    Years since quitting: 7.2  . Smokeless tobacco: Never Used  . Tobacco comment: smoked age [age] up to 1 ppd  Substance and Sexual Activity  . Alcohol use: Yes    Alcohol/week: 1.8 - 2.4 oz    Types: 3 - 4 Glasses of wine per week    Comment: socially/ weekly  . Drug use: No  . Sexual activity: Never  Lifestyle  . Physical activity:    Days per week: Not on file    Minutes per session: Not on file  . Stress: Not on file  Relationships  .  Social connections:    Talks on phone: Not on file    Gets together: Not on file    Attends religious service: Not on file    Active member of club or organization: Not on file    Attends meetings of clubs or organizations: Not on file    Relationship status: Not on file  . Intimate partner violence:    Fear of current or ex partner: Not on file    Emotionally abused: Not on file    Physically abused: Not on file    Forced sexual activity: Not on file  Other Topics Concern  . Not on file  Social History Narrative  . Not on file    Past Surgical History:  Procedure Laterality Date  . BREAST LUMPECTOMY Right 01/20/2016   Procedure: RIGHT BREAST LUMPECTOMY;  Surgeon: [name], MD;  Location: [facility];  Service: General;  Laterality: Right;  . BREAST LUMPECTOMY WITH RADIOACTIVE  SEED LOCALIZATION Right 01/10/2016   Procedure: BREAST LUMPECTOMY WITH RADIOACTIVE SEED LOCALIZATION;  Surgeon: [name], MD;  Location: [facility];  Service: General;  Laterality: Right;  . CERVICAL FUSION  07/30/2010   & Discectomy, Dr Vertell Limber ( @ 3 levels)/has plate and 6 screws  . SEPTOPLASTY  1983   for septal deviation  . TONSILLECTOMY    . uterine ablation  2008   Dr Ronita Hipps  . WISDOM TOOTH EXTRACTION      Family History  Problem Relation Age of Onset  . Diabetes Father        Type II, AO  . Alcohol abuse Father   . Depression Father   . Prostate cancer Father 76  . Depression Brother   . HIV Brother        AIDS  . Heart attack Paternal Grandfather 3  . Heart disease Paternal Grandfather   . Alcohol abuse Paternal Grandfather        smoker  . Colon cancer Maternal Grandfather        dx late 67s; w/ colostomy  . COPD Paternal Grandmother   . Alcohol abuse Paternal Grandmother        smoker  . Emphysema Paternal Grandmother   . Bronchitis Paternal Grandmother   . Heart disease Mother        cardiac rest, epinephrine with Novocaine  . Arthritis Mother   . Other Maternal Grandmother        issues w/ breast in her 90s--may have been breast cancer  . Stroke Neg Hx   . Asthma Neg Hx   . Allergic rhinitis Neg Hx   . Angioedema Neg Hx   . Eczema Neg Hx   . Immunodeficiency Neg Hx   . Urticaria Neg Hx     Allergies  Allergen Reactions  . Codeine     REACTION: nausea & vomiting  . Procaine Hcl     REACTION: tachycardia in family members!    Current Outpatient Medications on File Prior to Visit  Medication Sig Dispense Refill  . cholecalciferol (VITAMIN D) 1000 units tablet Take 1,000 Units by mouth daily.    . Cyanocobalamin (VITAMIN B 12 PO) Take by mouth.    . EPINEPHrine (EPIPEN 2-PAK) 0.3 mg/0.3 mL IJ SOAJ injection Use as directed for severe allergic reactions 2 Device 3  . fexofenadine-pseudoephedrine (ALLEGRA-D 24) 180-240 MG 24 hr tablet  Take 1 tablet by mouth daily.    . Probiotic Product ([place]) Take by mouth. Take one pill daily  Current Facility-Administered Medications on File Prior to Visit  Medication Dose Route Frequency Provider Last Rate Last Dose  . 0.9 %  sodium chloride infusion  500 mL Intravenous Once Armbruster, Carlota Raspberry, MD        BP 103/65   Pulse (!) 59   Temp 98.3 F (36.8 C) (Oral)   Resp 16   Wt 162 lb 6.4 oz (73.7 kg)   SpO2 100%   BMI 25.44 kg/m       Objective:   Physical Exam  General Appearance- Not in acute distress.    Chest and Lung Exam Auscultation: Breath sounds:-Normal. Clear even and unlabored. Adventitious sounds:- No Adventitious sounds.  Cardiovascular Auscultation:Rythm - Regular, rate and rythm. Heart Sounds -Normal heart sounds.  Abdomen Inspection:-Inspection Normal.  Palpation/Perucssion: Palpation and Percussion of the abdomen reveal- Non Tender, No Rebound tenderness, No rigidity(Guarding) and No Palpable abdominal masses.  Liver:-Normal.  Spleen:- Normal.   Back Mid lumbar spine tenderness to palpation. No pain straight leg lift. Pain on minimal  lateral movements and flexion/extension of the spine.(changing positions) Lower ext neurologic  L5-S1 sensation intact bilaterally. Normal patellar reflexes bilaterally. No foot drop bilaterally.      Assessment & Plan:  For your low back pain, we gave you Toradol 60 mg injection today.  Also prescribing meloxicam NSAID to start tomorrow morning.  Prescribing Flexeril to use at night.  We had a discussion today about narcotic use for pain.  Presently you declined.  However please let me know if you are in extreme severe pain.  Please get lumbar x-ray today.  If signs and symptoms worsen or change let me know.  Red flag signs symptoms reviewed that would indicate ED evaluation.  Printed back exercises to do but would do those as tolerated a 1 your pain level decreases  significantly.  Follow-up in 7 to 10 days or as needed.  Mackie Pai, PA-C

## 2017-09-01 NOTE — Patient Instructions (Addendum)
For your low back pain, we gave you Toradol 60 mg injection today.  Also prescribing meloxicam NSAID to start tomorrow morning.  Prescribing Flexeril to use at night.  We had a discussion today about narcotic use for pain.  Presently you declined.  However please let me know if you are in extreme severe pain.  Please get lumbar x-ray today.  If signs and symptoms worsen or change let me know.  Red flag signs symptoms reviewed that would indicate ED evaluation.  Printed back exercises to do but would do those as tolerated a 1 your pain level decreases significantly.  Follow-up in 7 to 10 days or as needed.   Back Exercises If you have pain in your back, do these exercises 2-3 times each day or as told by your doctor. When the pain goes away, do the exercises once each day, but repeat the steps more times for each exercise (do more repetitions). If you do not have pain in your back, do these exercises once each day or as told by your doctor. Exercises Single Knee to Chest  Do these steps 3-5 times in a row for each leg: 1. Lie on your back on a firm bed or the floor with your legs stretched out. 2. Bring one knee to your chest. 3. Hold your knee to your chest by grabbing your knee or thigh. 4. Pull on your knee until you feel a gentle stretch in your lower back. 5. Keep doing the stretch for 10-30 seconds. 6. Slowly let go of your leg and straighten it.  Pelvic Tilt  Do these steps 5-10 times in a row: 1. Lie on your back on a firm bed or the floor with your legs stretched out. 2. Bend your knees so they point up to the ceiling. Your feet should be flat on the floor. 3. Tighten your lower belly (abdomen) muscles to press your lower back against the floor. This will make your tailbone point up to the ceiling instead of pointing down to your feet or the floor. 4. Stay in this position for 5-10 seconds while you gently tighten your muscles and breathe evenly.  Cat-Cow  Do these steps  until your lower back bends more easily: 1. Get on your hands and knees on a firm surface. Keep your hands under your shoulders, and keep your knees under your hips. You may put padding under your knees. 2. Let your head hang down, and make your tailbone point down to the floor so your lower back is round like the back of a cat. 3. Stay in this position for 5 seconds. 4. Slowly lift your head and make your tailbone point up to the ceiling so your back hangs low (sags) like the back of a cow. 5. Stay in this position for 5 seconds.  Press-Ups  Do these steps 5-10 times in a row: 1. Lie on your belly (face-down) on the floor. 2. Place your hands near your head, about shoulder-width apart. 3. While you keep your back relaxed and keep your hips on the floor, slowly straighten your arms to raise the top half of your body and lift your shoulders. Do not use your back muscles. To make yourself more comfortable, you may change where you place your hands. 4. Stay in this position for 5 seconds. 5. Slowly return to lying flat on the floor.  Bridges  Do these steps 10 times in a row: 1. Lie on your back on a firm surface.  2. Bend your knees so they point up to the ceiling. Your feet should be flat on the floor. 3. Tighten your butt muscles and lift your butt off of the floor until your waist is almost as high as your knees. If you do not feel the muscles working in your butt and the back of your thighs, slide your feet 1-2 inches farther away from your butt. 4. Stay in this position for 3-5 seconds. 5. Slowly lower your butt to the floor, and let your butt muscles relax.  If this exercise is too easy, try doing it with your arms crossed over your chest. Belly Crunches  Do these steps 5-10 times in a row: 1. Lie on your back on a firm bed or the floor with your legs stretched out. 2. Bend your knees so they point up to the ceiling. Your feet should be flat on the floor. 3. Cross your arms over  your chest. 4. Tip your chin a little bit toward your chest but do not bend your neck. 5. Tighten your belly muscles and slowly raise your chest just enough to lift your shoulder blades a tiny bit off of the floor. 6. Slowly lower your chest and your head to the floor.  Back Lifts Do these steps 5-10 times in a row: 1. Lie on your belly (face-down) with your arms at your sides, and rest your forehead on the floor. 2. Tighten the muscles in your legs and your butt. 3. Slowly lift your chest off of the floor while you keep your hips on the floor. Keep the back of your head in line with the curve in your back. Look at the floor while you do this. 4. Stay in this position for 3-5 seconds. 5. Slowly lower your chest and your face to the floor.  Contact a doctor if:  Your back pain gets a lot worse when you do an exercise.  Your back pain does not lessen 2 hours after you exercise. If you have any of these problems, stop doing the exercises. Do not do them again unless your doctor says it is okay. Get help right away if:  You have sudden, very bad back pain. If this happens, stop doing the exercises. Do not do them again unless your doctor says it is okay. This information is not intended to replace advice given to you by your health care provider. Make sure you discuss any questions you have with your health care provider. Document Released: 05/16/2010 Document Revised: 09/19/2015 Document Reviewed: 06/07/2014 Elsevier Interactive Patient Education  [employer].

## 2017-09-07 ENCOUNTER — Ambulatory Visit (INDEPENDENT_AMBULATORY_CARE_PROVIDER_SITE_OTHER): Payer: BLUE CROSS/BLUE SHIELD

## 2017-09-07 DIAGNOSIS — J309 Allergic rhinitis, unspecified: Secondary | ICD-10-CM | POA: Diagnosis not present

## 2017-09-17 ENCOUNTER — Ambulatory Visit (INDEPENDENT_AMBULATORY_CARE_PROVIDER_SITE_OTHER): Payer: BLUE CROSS/BLUE SHIELD | Admitting: *Deleted

## 2017-09-17 DIAGNOSIS — J309 Allergic rhinitis, unspecified: Secondary | ICD-10-CM

## 2017-09-23 ENCOUNTER — Ambulatory Visit (INDEPENDENT_AMBULATORY_CARE_PROVIDER_SITE_OTHER): Payer: BLUE CROSS/BLUE SHIELD

## 2017-09-23 DIAGNOSIS — J309 Allergic rhinitis, unspecified: Secondary | ICD-10-CM | POA: Diagnosis not present

## 2017-09-29 ENCOUNTER — Ambulatory Visit (INDEPENDENT_AMBULATORY_CARE_PROVIDER_SITE_OTHER): Payer: BLUE CROSS/BLUE SHIELD | Admitting: *Deleted

## 2017-09-29 DIAGNOSIS — J309 Allergic rhinitis, unspecified: Secondary | ICD-10-CM

## 2017-10-05 ENCOUNTER — Ambulatory Visit (INDEPENDENT_AMBULATORY_CARE_PROVIDER_SITE_OTHER): Payer: BLUE CROSS/BLUE SHIELD

## 2017-10-05 DIAGNOSIS — J309 Allergic rhinitis, unspecified: Secondary | ICD-10-CM | POA: Diagnosis not present

## 2017-10-12 ENCOUNTER — Ambulatory Visit (INDEPENDENT_AMBULATORY_CARE_PROVIDER_SITE_OTHER): Payer: BLUE CROSS/BLUE SHIELD

## 2017-10-12 DIAGNOSIS — J309 Allergic rhinitis, unspecified: Secondary | ICD-10-CM | POA: Diagnosis not present

## 2017-10-19 ENCOUNTER — Ambulatory Visit (INDEPENDENT_AMBULATORY_CARE_PROVIDER_SITE_OTHER): Payer: BLUE CROSS/BLUE SHIELD

## 2017-10-19 DIAGNOSIS — J309 Allergic rhinitis, unspecified: Secondary | ICD-10-CM | POA: Diagnosis not present

## 2017-10-20 ENCOUNTER — Encounter: Payer: Self-pay | Admitting: *Deleted

## 2017-10-20 NOTE — Progress Notes (Signed)
Maintenance vial made. Exp: 10-21-18. hv 

## 2017-10-21 DIAGNOSIS — J301 Allergic rhinitis due to pollen: Secondary | ICD-10-CM | POA: Diagnosis not present

## 2017-10-26 ENCOUNTER — Ambulatory Visit (INDEPENDENT_AMBULATORY_CARE_PROVIDER_SITE_OTHER): Payer: BLUE CROSS/BLUE SHIELD

## 2017-10-26 DIAGNOSIS — J309 Allergic rhinitis, unspecified: Secondary | ICD-10-CM

## 2017-11-01 ENCOUNTER — Ambulatory Visit (INDEPENDENT_AMBULATORY_CARE_PROVIDER_SITE_OTHER): Payer: BLUE CROSS/BLUE SHIELD

## 2017-11-01 DIAGNOSIS — J309 Allergic rhinitis, unspecified: Secondary | ICD-10-CM | POA: Diagnosis not present

## 2017-11-08 ENCOUNTER — Ambulatory Visit (INDEPENDENT_AMBULATORY_CARE_PROVIDER_SITE_OTHER): Payer: BLUE CROSS/BLUE SHIELD | Admitting: *Deleted

## 2017-11-08 DIAGNOSIS — J309 Allergic rhinitis, unspecified: Secondary | ICD-10-CM

## 2017-11-18 ENCOUNTER — Other Ambulatory Visit: Payer: Self-pay | Admitting: Obstetrics and Gynecology

## 2017-11-18 DIAGNOSIS — Z853 Personal history of malignant neoplasm of breast: Secondary | ICD-10-CM

## 2017-11-22 ENCOUNTER — Ambulatory Visit (INDEPENDENT_AMBULATORY_CARE_PROVIDER_SITE_OTHER): Payer: BLUE CROSS/BLUE SHIELD

## 2017-11-22 DIAGNOSIS — J309 Allergic rhinitis, unspecified: Secondary | ICD-10-CM

## 2017-11-30 ENCOUNTER — Ambulatory Visit (INDEPENDENT_AMBULATORY_CARE_PROVIDER_SITE_OTHER): Payer: BLUE CROSS/BLUE SHIELD

## 2017-11-30 DIAGNOSIS — J309 Allergic rhinitis, unspecified: Secondary | ICD-10-CM | POA: Diagnosis not present

## 2017-12-06 ENCOUNTER — Ambulatory Visit (INDEPENDENT_AMBULATORY_CARE_PROVIDER_SITE_OTHER): Payer: BLUE CROSS/BLUE SHIELD | Admitting: *Deleted

## 2017-12-06 DIAGNOSIS — J309 Allergic rhinitis, unspecified: Secondary | ICD-10-CM

## 2017-12-13 ENCOUNTER — Ambulatory Visit (INDEPENDENT_AMBULATORY_CARE_PROVIDER_SITE_OTHER): Payer: BLUE CROSS/BLUE SHIELD | Admitting: *Deleted

## 2017-12-13 DIAGNOSIS — J309 Allergic rhinitis, unspecified: Secondary | ICD-10-CM

## 2017-12-22 ENCOUNTER — Ambulatory Visit (INDEPENDENT_AMBULATORY_CARE_PROVIDER_SITE_OTHER): Payer: BLUE CROSS/BLUE SHIELD | Admitting: *Deleted

## 2017-12-22 DIAGNOSIS — J309 Allergic rhinitis, unspecified: Secondary | ICD-10-CM

## 2017-12-24 ENCOUNTER — Ambulatory Visit
Admission: RE | Admit: 2017-12-24 | Discharge: 2017-12-24 | Disposition: A | Discharge status: Home / Self Care | Payer: BLUE CROSS/BLUE SHIELD | Source: Ambulatory Visit | Attending: Obstetrics and Gynecology | Admitting: Obstetrics and Gynecology

## 2017-12-24 DIAGNOSIS — Z853 Personal history of malignant neoplasm of breast: Secondary | ICD-10-CM

## 2017-12-29 ENCOUNTER — Ambulatory Visit (INDEPENDENT_AMBULATORY_CARE_PROVIDER_SITE_OTHER): Payer: BLUE CROSS/BLUE SHIELD

## 2017-12-29 DIAGNOSIS — J309 Allergic rhinitis, unspecified: Secondary | ICD-10-CM

## 2018-01-05 ENCOUNTER — Ambulatory Visit (INDEPENDENT_AMBULATORY_CARE_PROVIDER_SITE_OTHER): Payer: BLUE CROSS/BLUE SHIELD | Admitting: *Deleted

## 2018-01-05 DIAGNOSIS — J309 Allergic rhinitis, unspecified: Secondary | ICD-10-CM | POA: Diagnosis not present

## 2018-01-11 ENCOUNTER — Ambulatory Visit (INDEPENDENT_AMBULATORY_CARE_PROVIDER_SITE_OTHER): Payer: BLUE CROSS/BLUE SHIELD | Admitting: *Deleted

## 2018-01-11 DIAGNOSIS — J309 Allergic rhinitis, unspecified: Secondary | ICD-10-CM | POA: Diagnosis not present

## 2018-01-17 ENCOUNTER — Encounter: Payer: Self-pay | Admitting: Obstetrics & Gynecology

## 2018-01-17 ENCOUNTER — Other Ambulatory Visit (HOSPITAL_COMMUNITY)
Admission: RE | Admit: 2018-01-17 | Discharge: 2018-01-17 | Disposition: A | Discharge status: Home / Self Care | Payer: BLUE CROSS/BLUE SHIELD | Source: Ambulatory Visit | Attending: Obstetrics & Gynecology | Admitting: Obstetrics & Gynecology

## 2018-01-17 ENCOUNTER — Ambulatory Visit (INDEPENDENT_AMBULATORY_CARE_PROVIDER_SITE_OTHER): Payer: BLUE CROSS/BLUE SHIELD | Admitting: Obstetrics & Gynecology

## 2018-01-17 ENCOUNTER — Other Ambulatory Visit: Payer: Self-pay

## 2018-01-17 VITALS — BP 102/60 | HR 68 | Resp 16 | Ht 66.5 in | Wt 160.8 lb

## 2018-01-17 DIAGNOSIS — Z01419 Encounter for gynecological examination (general) (routine) without abnormal findings: Secondary | ICD-10-CM

## 2018-01-17 DIAGNOSIS — N852 Hypertrophy of uterus: Secondary | ICD-10-CM | POA: Diagnosis not present

## 2018-01-17 DIAGNOSIS — R102 Pelvic and perineal pain: Secondary | ICD-10-CM

## 2018-01-17 DIAGNOSIS — Z124 Encounter for screening for malignant neoplasm of cervix: Secondary | ICD-10-CM

## 2018-01-17 NOTE — Progress Notes (Signed)
VIALS EXP 01-18-19 

## 2018-01-17 NOTE — Progress Notes (Signed)
52 y.o. [id number] [name] here for new patient annual exam.  Had hx of significant cramping over the past few years.  She reports she hasn't really had much of this.  H/O endometrial ablation of 2008.  Did bleed intermittently.  She having some hot flashes and night sweats.  This is very manageable.    In 2017, had lumpectomy with DCIS.  Dr. Lucia Fuentes was her breast surgeon.  Not on any HRT and knows she should not be.  Does not have any family hx of breast cancer.  Patient's last menstrual period was 04/25/2015.          Sexually active: Yes.    The current method of family planning is post menopausal status.    Exercising: Yes.    walking, gym Smoker:  no  Health Maintenance: Pap:  12/2016 Normal  History of abnormal Pap:  Yes, years ago MMG:  12/24/17 Diagnostic Bilateral  BIRADS2:benign. F/u 1 year  Colonoscopy:  07/23/17 Polyps. F/u 1 year  Dr. Havery Fuentes BMD:   Never TDaP:  2014 Pneumonia vaccine(s):  n/a Shingrix:   No Hep C testing: No Screening Labs: PCP   reports that she quit smoking about 7 years ago. Her smoking use included cigarettes. She quit after 28.00 years of use. She has never used smokeless tobacco. She reports that she drinks about 3.0 - 4.0 standard drinks of alcohol per week. She reports that she does not use drugs.  Past Medical History:  Diagnosis Date  . Abnormal Pap smear of cervix    before children  . Alkaline phosphatase deficiency    28 in 9/11  . Allergic state 08/23/2015  . Arthritis    hip  . Breast cancer of lower-outer quadrant of right female breast ([place]) 12/20/2015   right breast  . Eustachian tube dysfunction 02/2014  . H/O recurrent pneumonia 08/23/2015  . History of chicken pox 08/23/2015  . History of radiation therapy 03/05/16- 04/02/16   Right Breast treated to 40.05 Gy in 15 fractions & Right Breast boost to 10 Gy in 5 fractions.   . Low BP   . Perimenopause 08/23/2015  . Personal history of radiation therapy 2017    right breast/ had 20 radiation sessions  . Pneumonia   . Preventative health care 08/23/2015  . RAD (reactive airway disease)     FORMER smoker , RAD with RTI's, cat or allergen exposure  . Sacral pain 2014   trauma ( fall)  . Serous otitis media 02/2014    Past Surgical History:  Procedure Laterality Date  . BREAST LUMPECTOMY Right 01/20/2016   Procedure: RIGHT BREAST LUMPECTOMY;  Surgeon: Barbara Overall, MD;  Location: [facility];  Service: General;  Laterality: Right;  . BREAST LUMPECTOMY WITH RADIOACTIVE SEED LOCALIZATION Right 01/10/2016   Procedure: BREAST LUMPECTOMY WITH RADIOACTIVE SEED LOCALIZATION;  Surgeon: Barbara Overall, MD;  Location: [facility];  Service: General;  Laterality: Right;  . CERVICAL FUSION  07/30/2010   & Discectomy, Dr Barbara Fuentes ( @ 3 levels)/has plate and 6 screws  . SEPTOPLASTY  1983   for septal deviation  . TONSILLECTOMY    . uterine ablation  2008   Dr Barbara Fuentes  . WISDOM TOOTH EXTRACTION      Current Outpatient Medications  Medication Sig Dispense Refill  . cholecalciferol (VITAMIN D) 1000 units tablet Take 1,000 Units by mouth daily.    . fexofenadine-pseudoephedrine (ALLEGRA-D 24) 180-240 MG 24 hr tablet Take 1 tablet by mouth daily.    Marland Kitchen  Probiotic Product ([place]) Take by mouth. Take one pill daily    . EPINEPHrine (EPIPEN 2-PAK) 0.3 mg/0.3 mL IJ SOAJ injection Use as directed for severe allergic reactions (Patient not taking: Reported on 01/17/2018) 2 Device 3   Current Facility-Administered Medications  Medication Dose Route Frequency Provider Last Rate Last Dose  . 0.9 %  sodium chloride infusion  500 mL Intravenous Once Barbara Fuentes, Barbara Raspberry, MD        Family History  Problem Relation Age of Onset  . Diabetes Father        Type II, AO  . Alcohol abuse Father   . Depression Father   . Prostate cancer Father 31  . Depression Brother   . HIV Brother        AIDS  . Heart attack Paternal Grandfather 46  . Heart  disease Paternal Grandfather   . Alcohol abuse Paternal Grandfather        smoker  . Colon cancer Maternal Grandfather        dx late 42s; w/ colostomy  . COPD Paternal Grandmother   . Alcohol abuse Paternal Grandmother        smoker  . Emphysema Paternal Grandmother   . Bronchitis Paternal Grandmother   . Heart disease Mother        cardiac rest, epinephrine with Novocaine  . Arthritis Mother   . Other Maternal Grandmother        issues w/ breast in her 90s--may have been breast cancer  . Stroke Neg Hx   . Asthma Neg Hx   . Allergic rhinitis Neg Hx   . Angioedema Neg Hx   . Eczema Neg Hx   . Immunodeficiency Neg Hx   . Urticaria Neg Hx     Review of Systems  All other systems reviewed and are negative.   Exam:   BP 102/60 (BP Location: Right Arm, Patient Position: Sitting, Cuff Size: Normal)   Pulse 68   Resp 16   Ht 5' 6.5" (1.689 m)   Wt 160 lb 12.8 oz (72.9 kg)   LMP 04/25/2015 Comment: Uterine ablations  BMI 25.56 kg/m      Height: 5' 6.5" (168.9 cm)  Ht Readings from Last 3 Encounters:  01/17/18 5' 6.5" (1.689 m)  09/01/17 5\' 7"  (1.702 m)  08/02/17 5\' 7"  (1.702 m)    General appearance: alert, cooperative and appears stated age Head: Normocephalic, without obvious abnormality, atraumatic Neck: no adenopathy, supple, symmetrical, trachea midline and thyroid normal to inspection and palpation Lungs: clear to auscultation bilaterally Breasts: normal appearance, no masses or tenderness, well healed scar on right breast and essentially no radiation changes noted Heart: regular rate and rhythm Abdomen: soft, non-tender; bowel sounds normal; no masses,  no organomegaly Extremities: extremities normal, atraumatic, no cyanosis or edema Skin: Skin color, texture, turgor normal. Left inner thigh 3 x 39mm black nevus  Lymph nodes: Cervical, supraclavicular, and axillary nodes normal. No abnormal inguinal nodes palpated Neurologic: Grossly normal   Pelvic: External  genitalia:  no lesions              Urethra:  normal appearing urethra with no masses, tenderness or lesions              Bartholins and Skenes: normal                 Vagina: normal appearing vagina with normal color and discharge, no lesions  Cervix: no lesions              Pap taken: Yes.   Bimanual Exam:  Uterus:  enlarged, 8-10 weeks size, with 4-5 cm nodularity off right side of uterus vs atypical shape of uterus              Adnexa: normal adnexa and no mass, fullness, tenderness               Rectovaginal: Confirms               Anus:  normal sphincter tone, no lesions  Chaperone was present for exam.  A:  Well Woman with normal exam PMP H/O DCIS and sclerosing lesion Dark nevus on left inner thick Family hx of colon cancer, maternal GF in his late 62's H/O endometrial ablation Enlarged uterus c/w fibroids  P:   Mammogram guidelines reviewed.  Doing 3D MMG.  Tyrer Cusick model 16.5% lifetime risk of breast cancer. pap smear with HR HPV obtained today PUS ordered.  Pt will return for this Encouraged pt to see dermatology dur to dark thigh lesion Colonoscopy 3/19.  Repeat recommended 1 year. return annually or prn

## 2018-01-17 NOTE — Patient Instructions (Signed)
[facility] Phone: [phone] Location: Lower level of [facility] ([address]) Hours: 7:30 a.m. to 6:00 p.m., Monday through Friday.   [facility] Phone: [phone] Location: [address] Hours: 7:30 a.m. to 6:00 p.m., Monday through Friday.  

## 2018-01-18 DIAGNOSIS — J301 Allergic rhinitis due to pollen: Secondary | ICD-10-CM

## 2018-01-20 ENCOUNTER — Encounter: Payer: Self-pay | Admitting: Obstetrics & Gynecology

## 2018-01-20 ENCOUNTER — Ambulatory Visit (INDEPENDENT_AMBULATORY_CARE_PROVIDER_SITE_OTHER): Payer: BLUE CROSS/BLUE SHIELD

## 2018-01-20 DIAGNOSIS — J309 Allergic rhinitis, unspecified: Secondary | ICD-10-CM | POA: Diagnosis not present

## 2018-01-20 LAB — CYTOLOGY - PAP
Diagnosis: NEGATIVE
HPV: NOT DETECTED

## 2018-02-02 ENCOUNTER — Ambulatory Visit (INDEPENDENT_AMBULATORY_CARE_PROVIDER_SITE_OTHER): Payer: BLUE CROSS/BLUE SHIELD

## 2018-02-02 DIAGNOSIS — J309 Allergic rhinitis, unspecified: Secondary | ICD-10-CM

## 2018-02-03 ENCOUNTER — Encounter: Payer: Self-pay | Admitting: Obstetrics & Gynecology

## 2018-02-03 ENCOUNTER — Ambulatory Visit (INDEPENDENT_AMBULATORY_CARE_PROVIDER_SITE_OTHER): Payer: BLUE CROSS/BLUE SHIELD | Admitting: Obstetrics & Gynecology

## 2018-02-03 ENCOUNTER — Ambulatory Visit (INDEPENDENT_AMBULATORY_CARE_PROVIDER_SITE_OTHER): Payer: BLUE CROSS/BLUE SHIELD

## 2018-02-03 VITALS — BP 92/60 | HR 64 | Resp 14 | Ht 66.5 in | Wt 159.4 lb

## 2018-02-03 DIAGNOSIS — N852 Hypertrophy of uterus: Secondary | ICD-10-CM

## 2018-02-03 DIAGNOSIS — R102 Pelvic and perineal pain: Secondary | ICD-10-CM | POA: Diagnosis not present

## 2018-02-03 DIAGNOSIS — D251 Intramural leiomyoma of uterus: Secondary | ICD-10-CM | POA: Diagnosis not present

## 2018-02-03 NOTE — Progress Notes (Signed)
52 y.o. [id number] [name] here for pelvic ultrasound due to enlarged uterus on exam and intermittent pelvic pain that she's been experiencing for the past year or more.  This pain is much better recently.  Patient's last menstrual period was 04/25/2015.  Contraception: PMP  Findings:  UTERUS: 9.2 x 5.7 x 5.0cm with 4.6 x 4.0 x 4.5cm fibroid and 0.8 x 0.8cm fibroid, complex nabothian cyst noted as well EMS: 2.52mm, this is distorted with possible encroachment ADNEXA: Left ovary: 3.0 x 1.5 x 1.3 cm with 1.0 x 4.3KG follicle (thick walled, appears collapsed)       Right ovary: 2.4 x 2.0 x 2.0cm CUL DE SAC: no free fluid  Discussion:  Findings reviewed with pt.  She is very satisfied to know that a larger fibroid is present.  As pain has improved, she wants to watch and not proceed with any treatment at this time.  Given size of fibroid, I feel repeat PUS in six months is prudent to ensure there is no additional growth in size.  Pt is comfortable with plan.  Assessment:  Pelvic pain that is improved 4.6cm fibroid  Plan:  Repeat PUS in six months.  Pt knows to call if pain worsens or with any new issues/concerns. Questions answered.    ~15 minutes spent with patient >50% of time was in face to face discussion of above.

## 2018-02-06 DIAGNOSIS — D251 Intramural leiomyoma of uterus: Secondary | ICD-10-CM | POA: Insufficient documentation

## 2018-02-08 ENCOUNTER — Ambulatory Visit (INDEPENDENT_AMBULATORY_CARE_PROVIDER_SITE_OTHER): Payer: BLUE CROSS/BLUE SHIELD

## 2018-02-08 DIAGNOSIS — J309 Allergic rhinitis, unspecified: Secondary | ICD-10-CM

## 2018-02-15 ENCOUNTER — Ambulatory Visit (INDEPENDENT_AMBULATORY_CARE_PROVIDER_SITE_OTHER): Payer: BLUE CROSS/BLUE SHIELD

## 2018-02-15 DIAGNOSIS — J309 Allergic rhinitis, unspecified: Secondary | ICD-10-CM | POA: Diagnosis not present

## 2018-03-01 ENCOUNTER — Ambulatory Visit (INDEPENDENT_AMBULATORY_CARE_PROVIDER_SITE_OTHER): Payer: BLUE CROSS/BLUE SHIELD

## 2018-03-01 ENCOUNTER — Encounter: Payer: BLUE CROSS/BLUE SHIELD | Admitting: Obstetrics & Gynecology

## 2018-03-01 DIAGNOSIS — J309 Allergic rhinitis, unspecified: Secondary | ICD-10-CM | POA: Diagnosis not present

## 2018-03-17 ENCOUNTER — Ambulatory Visit (INDEPENDENT_AMBULATORY_CARE_PROVIDER_SITE_OTHER): Payer: BLUE CROSS/BLUE SHIELD

## 2018-03-17 DIAGNOSIS — J309 Allergic rhinitis, unspecified: Secondary | ICD-10-CM

## 2018-03-23 ENCOUNTER — Ambulatory Visit (INDEPENDENT_AMBULATORY_CARE_PROVIDER_SITE_OTHER): Payer: BLUE CROSS/BLUE SHIELD

## 2018-03-23 DIAGNOSIS — J309 Allergic rhinitis, unspecified: Secondary | ICD-10-CM

## 2018-03-29 ENCOUNTER — Ambulatory Visit (INDEPENDENT_AMBULATORY_CARE_PROVIDER_SITE_OTHER): Payer: BLUE CROSS/BLUE SHIELD

## 2018-03-29 DIAGNOSIS — J309 Allergic rhinitis, unspecified: Secondary | ICD-10-CM | POA: Diagnosis not present

## 2018-04-18 ENCOUNTER — Ambulatory Visit (INDEPENDENT_AMBULATORY_CARE_PROVIDER_SITE_OTHER): Payer: BLUE CROSS/BLUE SHIELD

## 2018-04-18 DIAGNOSIS — J309 Allergic rhinitis, unspecified: Secondary | ICD-10-CM

## 2018-04-28 ENCOUNTER — Ambulatory Visit (INDEPENDENT_AMBULATORY_CARE_PROVIDER_SITE_OTHER): Payer: BLUE CROSS/BLUE SHIELD

## 2018-04-28 DIAGNOSIS — J309 Allergic rhinitis, unspecified: Secondary | ICD-10-CM

## 2018-05-02 DIAGNOSIS — J301 Allergic rhinitis due to pollen: Secondary | ICD-10-CM | POA: Diagnosis not present

## 2018-05-02 NOTE — Progress Notes (Signed)
Vials exp 05-03-2019 

## 2018-05-04 ENCOUNTER — Ambulatory Visit (INDEPENDENT_AMBULATORY_CARE_PROVIDER_SITE_OTHER): Payer: BLUE CROSS/BLUE SHIELD

## 2018-05-04 DIAGNOSIS — J309 Allergic rhinitis, unspecified: Secondary | ICD-10-CM

## 2018-05-10 ENCOUNTER — Ambulatory Visit (INDEPENDENT_AMBULATORY_CARE_PROVIDER_SITE_OTHER): Payer: BLUE CROSS/BLUE SHIELD | Admitting: *Deleted

## 2018-05-10 DIAGNOSIS — J309 Allergic rhinitis, unspecified: Secondary | ICD-10-CM | POA: Diagnosis not present

## 2018-05-17 ENCOUNTER — Ambulatory Visit (INDEPENDENT_AMBULATORY_CARE_PROVIDER_SITE_OTHER): Payer: BLUE CROSS/BLUE SHIELD

## 2018-05-17 DIAGNOSIS — J309 Allergic rhinitis, unspecified: Secondary | ICD-10-CM

## 2018-05-25 ENCOUNTER — Ambulatory Visit (INDEPENDENT_AMBULATORY_CARE_PROVIDER_SITE_OTHER): Payer: BLUE CROSS/BLUE SHIELD | Admitting: *Deleted

## 2018-05-25 ENCOUNTER — Telehealth: Payer: Self-pay | Admitting: *Deleted

## 2018-05-25 DIAGNOSIS — J309 Allergic rhinitis, unspecified: Secondary | ICD-10-CM | POA: Diagnosis not present

## 2018-05-25 NOTE — Telephone Encounter (Signed)
NON URGENT QUESTION-  Pt came in for allergy shots. She is on Red vials- (Mite-Animals-CR-Mold) sch A and (Grass-Tree) sch C.  Her question is when can she start skipping weeks on the (Mite-Animals-CR-Mold) vial. She is still having reactions on this vial. 0.45- quarter size.

## 2018-05-25 NOTE — Telephone Encounter (Signed)
Documented in flowsheet

## 2018-05-25 NOTE — Telephone Encounter (Signed)
Go ahead and freeze at 0.40 for mite animal vial .  This will be her maintenance dose and go to every 2 weeks

## 2018-06-02 ENCOUNTER — Ambulatory Visit (INDEPENDENT_AMBULATORY_CARE_PROVIDER_SITE_OTHER): Payer: BLUE CROSS/BLUE SHIELD

## 2018-06-02 DIAGNOSIS — J309 Allergic rhinitis, unspecified: Secondary | ICD-10-CM

## 2018-06-10 ENCOUNTER — Ambulatory Visit (INDEPENDENT_AMBULATORY_CARE_PROVIDER_SITE_OTHER): Payer: BLUE CROSS/BLUE SHIELD | Admitting: *Deleted

## 2018-06-10 DIAGNOSIS — J309 Allergic rhinitis, unspecified: Secondary | ICD-10-CM | POA: Diagnosis not present

## 2018-06-21 ENCOUNTER — Ambulatory Visit (INDEPENDENT_AMBULATORY_CARE_PROVIDER_SITE_OTHER): Payer: BLUE CROSS/BLUE SHIELD

## 2018-06-21 DIAGNOSIS — J309 Allergic rhinitis, unspecified: Secondary | ICD-10-CM

## 2018-06-29 ENCOUNTER — Ambulatory Visit (INDEPENDENT_AMBULATORY_CARE_PROVIDER_SITE_OTHER): Payer: BLUE CROSS/BLUE SHIELD

## 2018-06-29 DIAGNOSIS — J309 Allergic rhinitis, unspecified: Secondary | ICD-10-CM

## 2018-07-08 ENCOUNTER — Ambulatory Visit (INDEPENDENT_AMBULATORY_CARE_PROVIDER_SITE_OTHER): Payer: BLUE CROSS/BLUE SHIELD

## 2018-07-08 DIAGNOSIS — J309 Allergic rhinitis, unspecified: Secondary | ICD-10-CM

## 2018-07-11 ENCOUNTER — Encounter: Payer: Self-pay | Admitting: Family Medicine

## 2018-07-12 ENCOUNTER — Telehealth: Payer: Self-pay | Admitting: Family Medicine

## 2018-07-12 NOTE — Telephone Encounter (Signed)
Appt scheduled for 08/16/18.

## 2018-07-12 NOTE — Telephone Encounter (Signed)
Copied from [place] [phone]. Topic: Appointment Scheduling - Scheduling Inquiry for Clinic >> Jul 07, 2018  1:04 PM Margot Ables wrote: Reason for CRM: pt returning call to [facility] for rescheduling CPE with Dr. Charlett Blake. Is there a sooner date available.

## 2018-07-13 ENCOUNTER — Other Ambulatory Visit: Payer: Self-pay

## 2018-07-13 ENCOUNTER — Other Ambulatory Visit: Payer: Self-pay | Admitting: Family Medicine

## 2018-07-13 DIAGNOSIS — R509 Fever, unspecified: Secondary | ICD-10-CM

## 2018-07-13 DIAGNOSIS — R059 Cough, unspecified: Secondary | ICD-10-CM

## 2018-07-13 DIAGNOSIS — T753XXA Motion sickness, initial encounter: Secondary | ICD-10-CM

## 2018-07-13 DIAGNOSIS — R05 Cough: Secondary | ICD-10-CM

## 2018-07-13 DIAGNOSIS — R69 Illness, unspecified: Secondary | ICD-10-CM

## 2018-07-14 ENCOUNTER — Encounter: Payer: BLUE CROSS/BLUE SHIELD | Admitting: Family Medicine

## 2018-07-19 LAB — NOVEL CORONAVIRUS, NAA: SARS-CoV-2, NAA: NOT DETECTED

## 2018-07-25 ENCOUNTER — Ambulatory Visit (INDEPENDENT_AMBULATORY_CARE_PROVIDER_SITE_OTHER): Payer: BLUE CROSS/BLUE SHIELD

## 2018-07-25 DIAGNOSIS — J309 Allergic rhinitis, unspecified: Secondary | ICD-10-CM

## 2018-08-01 ENCOUNTER — Ambulatory Visit (INDEPENDENT_AMBULATORY_CARE_PROVIDER_SITE_OTHER): Payer: BLUE CROSS/BLUE SHIELD | Admitting: *Deleted

## 2018-08-01 DIAGNOSIS — J309 Allergic rhinitis, unspecified: Secondary | ICD-10-CM | POA: Diagnosis not present

## 2018-08-09 ENCOUNTER — Ambulatory Visit (INDEPENDENT_AMBULATORY_CARE_PROVIDER_SITE_OTHER): Payer: BLUE CROSS/BLUE SHIELD | Admitting: *Deleted

## 2018-08-09 DIAGNOSIS — J309 Allergic rhinitis, unspecified: Secondary | ICD-10-CM

## 2018-08-10 NOTE — Progress Notes (Signed)
Exp 08/11/19

## 2018-08-11 DIAGNOSIS — J3089 Other allergic rhinitis: Secondary | ICD-10-CM | POA: Diagnosis not present

## 2018-08-15 ENCOUNTER — Ambulatory Visit (INDEPENDENT_AMBULATORY_CARE_PROVIDER_SITE_OTHER): Payer: BLUE CROSS/BLUE SHIELD | Admitting: *Deleted

## 2018-08-15 DIAGNOSIS — J309 Allergic rhinitis, unspecified: Secondary | ICD-10-CM | POA: Diagnosis not present

## 2018-08-16 ENCOUNTER — Encounter: Payer: BLUE CROSS/BLUE SHIELD | Admitting: Family Medicine

## 2018-08-16 DIAGNOSIS — J301 Allergic rhinitis due to pollen: Secondary | ICD-10-CM | POA: Diagnosis not present

## 2018-08-31 ENCOUNTER — Ambulatory Visit (INDEPENDENT_AMBULATORY_CARE_PROVIDER_SITE_OTHER): Payer: BLUE CROSS/BLUE SHIELD

## 2018-08-31 DIAGNOSIS — J309 Allergic rhinitis, unspecified: Secondary | ICD-10-CM

## 2018-09-14 ENCOUNTER — Ambulatory Visit (INDEPENDENT_AMBULATORY_CARE_PROVIDER_SITE_OTHER): Payer: BLUE CROSS/BLUE SHIELD

## 2018-09-14 DIAGNOSIS — J309 Allergic rhinitis, unspecified: Secondary | ICD-10-CM

## 2018-09-26 ENCOUNTER — Ambulatory Visit (INDEPENDENT_AMBULATORY_CARE_PROVIDER_SITE_OTHER): Payer: BLUE CROSS/BLUE SHIELD

## 2018-09-26 DIAGNOSIS — J309 Allergic rhinitis, unspecified: Secondary | ICD-10-CM | POA: Diagnosis not present

## 2018-09-27 ENCOUNTER — Telehealth: Payer: Self-pay | Admitting: Obstetrics & Gynecology

## 2018-09-27 NOTE — Telephone Encounter (Signed)
Call placed to review benefits for recommended follow up ultrasound. Patient understood and agreeable. Patient ready to schedule. Patient scheduled 10/06/2018 with Dr Sabra Heck. Patient was offered earlier appointment dates, but declined due to work schedule. Patient aware of appointment date, arrival time and cancellation policy.  Forwarding to Dr Sabra Heck for final review. Patient is agreeable to disposition. Will close encounter

## 2018-09-28 ENCOUNTER — Telehealth: Payer: Self-pay | Admitting: Family Medicine

## 2018-09-28 NOTE — Telephone Encounter (Signed)
Pt left message needing to schedule cpe in the office, called pt LVM for pt to call the office and schedule a cpe, provider does not have anything soon since provider not seeing pt in office yet.

## 2018-10-04 ENCOUNTER — Other Ambulatory Visit: Payer: Self-pay

## 2018-10-06 ENCOUNTER — Other Ambulatory Visit: Payer: Self-pay

## 2018-10-06 ENCOUNTER — Ambulatory Visit (INDEPENDENT_AMBULATORY_CARE_PROVIDER_SITE_OTHER): Payer: BLUE CROSS/BLUE SHIELD | Admitting: Obstetrics & Gynecology

## 2018-10-06 ENCOUNTER — Ambulatory Visit (INDEPENDENT_AMBULATORY_CARE_PROVIDER_SITE_OTHER): Payer: BLUE CROSS/BLUE SHIELD

## 2018-10-06 ENCOUNTER — Other Ambulatory Visit: Payer: Self-pay | Admitting: *Deleted

## 2018-10-06 VITALS — BP 92/60 | HR 60 | Temp 97.8°F | Ht 66.5 in | Wt 154.6 lb

## 2018-10-06 DIAGNOSIS — N83202 Unspecified ovarian cyst, left side: Secondary | ICD-10-CM

## 2018-10-06 DIAGNOSIS — D251 Intramural leiomyoma of uterus: Secondary | ICD-10-CM

## 2018-10-06 DIAGNOSIS — N852 Hypertrophy of uterus: Secondary | ICD-10-CM

## 2018-10-06 NOTE — Progress Notes (Signed)
53 y.o. [id number] [name] here for pelvic ultrasound due to recheck fibroid uterus.  Pt has hx of endometrial ablation and has not had bleeding since 12/16.  Did have [place] obtained over a year ago that showed level was low but non undetectable.  Pt was having pelvic pain last year but this has resolved.    Patient's last menstrual period was 04/25/2015.  Contraception: none  Findings:  UTERUS:  10.1 x 6.3 x 5.5cm with 4.9 x 4.0 x 5.2cm fibroid and 1.7 x 1.6cm fibroid (these are slightly increased in size) EMS: 4.49mm ADNEXA: Left ovary:  3.7 x 2.1 x 1.5cm with 1.4 x 1.0cm collapsed corpus luteal cyst       Right ovary: 2.1 x 1.5 x 1.2cm CUL DE SAC: no free fluid  Discussion:  Given left ovary findings that would suggest she is not fully menopausal, feel should check FSH and AMH today.  If this is in menopausal range, will plan to repeat PUS in 6 months.  If this is not in menopausal range, would repeat lab work in a year and consider repeat PUS at that time or sooner if pt has any new symptoms.  She is comfortable with plan.  Assessment:  Uterine fibroids, increasing in size  Plan:  Pleak and AMH obtained today Plan PUS in six months if FSH/AMH is in menopausal range.  If uterine fibroids continue to increase in size, will recommend proceeding with hysterectomy.    ~15 minutes spent with patient >50% of time was in face to face discussion of above.

## 2018-10-08 ENCOUNTER — Encounter: Payer: Self-pay | Admitting: Obstetrics & Gynecology

## 2018-10-10 ENCOUNTER — Ambulatory Visit (INDEPENDENT_AMBULATORY_CARE_PROVIDER_SITE_OTHER): Payer: BLUE CROSS/BLUE SHIELD

## 2018-10-10 ENCOUNTER — Telehealth: Payer: Self-pay | Admitting: *Deleted

## 2018-10-10 DIAGNOSIS — D251 Intramural leiomyoma of uterus: Secondary | ICD-10-CM

## 2018-10-10 DIAGNOSIS — J309 Allergic rhinitis, unspecified: Secondary | ICD-10-CM | POA: Diagnosis not present

## 2018-10-10 DIAGNOSIS — N83202 Unspecified ovarian cyst, left side: Secondary | ICD-10-CM

## 2018-10-10 LAB — FOLLICLE STIMULATING HORMONE: FSH: 13 m[IU]/mL

## 2018-10-10 LAB — ANTI MULLERIAN HORMONE: ANTI-MULLERIAN HORMONE (AMH): 0.123 ng/mL

## 2018-10-10 NOTE — Telephone Encounter (Signed)
Patient is returning call to Jill. °

## 2018-10-10 NOTE — Telephone Encounter (Signed)
Left message to call Shaiden Aldous, RN at [facility] [phone].   

## 2018-10-10 NOTE — Telephone Encounter (Signed)
-----   Message from Megan Salon, MD sent at 10/10/2018 12:34 PM EDT ----- Please let pt know her [place] is 13 and AMH is low but not undetectable.  She is not menopausal yet.  That explains the cyst on her ovary with her ultrasound last week and why the uterus is still increasing in size.  She needs a follow up ultrasound in six months.    I don't think she is using anything for contraception.  She has an endometrial ablation but this is not contraception so she probably needs to use condoms.

## 2018-10-10 NOTE — Telephone Encounter (Signed)
Notes recorded by Burnice Logan, RN on 10/10/2018 at 1:58 PM EDT  Left message to call Sharee Pimple, RN at [facility].

## 2018-10-13 NOTE — Telephone Encounter (Signed)
Spoke with patient, advised as seen below per Dr. Sabra Heck. Patient reports spouse has had a vasectomy. PUS scheduled for 04/06/19 at 12:30, consult to follow at 1pm.   Last AEX 01/17/18, patient is requesting AEX with PUS. Advised patient these are typically scheduled separately, patient request to discuss with Dr. Sabra Heck. Advised I will review and advise. Patient verbalizes understanding and is agreeable.   Order placed for PUS for precert.   Dr. Sabra Heck -please advise on AEX with PUS 03/2019.   Cc: Lerry Liner, Magdalene Patricia

## 2018-10-16 NOTE — Telephone Encounter (Signed)
If you can get it scheduled together, I am fine with that.  Thanks.

## 2018-10-17 ENCOUNTER — Ambulatory Visit (INDEPENDENT_AMBULATORY_CARE_PROVIDER_SITE_OTHER): Payer: BLUE CROSS/BLUE SHIELD

## 2018-10-17 DIAGNOSIS — J309 Allergic rhinitis, unspecified: Secondary | ICD-10-CM

## 2018-10-17 NOTE — Telephone Encounter (Signed)
Left message to call Lumi Winslett, RN at [facility] [phone].   

## 2018-10-20 NOTE — Telephone Encounter (Signed)
Spoke with patient, advised as seen below per Dr. Sabra Heck. PUS with AEX is scheduled for 12/10/200 at 12:30pm, consult at 1pm with Dr. Sabra Heck.   Patient verbalizes understanding and is agreeable.   Encounter closed.

## 2018-10-24 ENCOUNTER — Ambulatory Visit (INDEPENDENT_AMBULATORY_CARE_PROVIDER_SITE_OTHER): Payer: BLUE CROSS/BLUE SHIELD

## 2018-10-24 DIAGNOSIS — J309 Allergic rhinitis, unspecified: Secondary | ICD-10-CM

## 2018-10-31 ENCOUNTER — Ambulatory Visit (INDEPENDENT_AMBULATORY_CARE_PROVIDER_SITE_OTHER): Payer: BLUE CROSS/BLUE SHIELD

## 2018-10-31 DIAGNOSIS — J309 Allergic rhinitis, unspecified: Secondary | ICD-10-CM | POA: Diagnosis not present

## 2018-11-16 ENCOUNTER — Ambulatory Visit (INDEPENDENT_AMBULATORY_CARE_PROVIDER_SITE_OTHER): Payer: BLUE CROSS/BLUE SHIELD

## 2018-11-16 DIAGNOSIS — J309 Allergic rhinitis, unspecified: Secondary | ICD-10-CM | POA: Diagnosis not present

## 2018-11-16 NOTE — Progress Notes (Signed)
VIALS EXP 11-16-2019 

## 2018-11-17 DIAGNOSIS — J301 Allergic rhinitis due to pollen: Secondary | ICD-10-CM | POA: Diagnosis not present

## 2018-11-23 ENCOUNTER — Ambulatory Visit: Payer: BLUE CROSS/BLUE SHIELD | Admitting: Family Medicine

## 2018-11-23 NOTE — Progress Notes (Deleted)
   [place] [address] Dept: [phone]  FOLLOW UP NOTE  Patient ID: Barbara Fuentes, female    DOB: January 13, 1966  Age: 53 y.o. MRN: [id number] Date of Office Visit: 11/23/2018  Assessment  Chief Complaint: No chief complaint on file.  HPI Barbara Fuentes is a 53 year old female who presents to the clinic. 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    Drug Allergies:  Allergies  Allergen Reactions  . Codeine     REACTION: nausea & vomiting  . Procaine Hcl     REACTION: tachycardia in family members!    Physical Exam: LMP 04/25/2015 Comment: Uterine ablations   Physical Exam  Diagnostics:    Assessment and Plan: No diagnosis found.  No orders of the defined types were placed in this encounter.   There are no Patient Instructions on file for this visit.  No follow-ups on file.    Thank you for the opportunity to care for this patient.  Please do not hesitate to contact me with questions.  Gareth Morgan, FNP [facility]

## 2018-11-29 ENCOUNTER — Ambulatory Visit: Payer: Self-pay

## 2018-11-29 ENCOUNTER — Encounter: Payer: Self-pay | Admitting: Family Medicine

## 2018-11-29 ENCOUNTER — Other Ambulatory Visit: Payer: Self-pay | Admitting: Obstetrics & Gynecology

## 2018-11-29 ENCOUNTER — Other Ambulatory Visit: Payer: Self-pay | Admitting: Obstetrics and Gynecology

## 2018-11-29 ENCOUNTER — Ambulatory Visit (INDEPENDENT_AMBULATORY_CARE_PROVIDER_SITE_OTHER): Payer: BLUE CROSS/BLUE SHIELD | Admitting: Family Medicine

## 2018-11-29 ENCOUNTER — Other Ambulatory Visit: Payer: Self-pay

## 2018-11-29 VITALS — BP 98/60 | HR 60 | Temp 98.4°F | Resp 12 | Ht 66.34 in | Wt 154.4 lb

## 2018-11-29 DIAGNOSIS — K9049 Malabsorption due to intolerance, not elsewhere classified: Secondary | ICD-10-CM | POA: Insufficient documentation

## 2018-11-29 DIAGNOSIS — J3089 Other allergic rhinitis: Secondary | ICD-10-CM

## 2018-11-29 DIAGNOSIS — J302 Other seasonal allergic rhinitis: Secondary | ICD-10-CM

## 2018-11-29 DIAGNOSIS — J309 Allergic rhinitis, unspecified: Secondary | ICD-10-CM

## 2018-11-29 DIAGNOSIS — Z853 Personal history of malignant neoplasm of breast: Secondary | ICD-10-CM

## 2018-11-29 DIAGNOSIS — Z8709 Personal history of other diseases of the respiratory system: Secondary | ICD-10-CM

## 2018-11-29 DIAGNOSIS — H1010 Acute atopic conjunctivitis, unspecified eye: Secondary | ICD-10-CM | POA: Diagnosis not present

## 2018-11-29 DIAGNOSIS — Z9889 Other specified postprocedural states: Secondary | ICD-10-CM

## 2018-11-29 MED ORDER — EPINEPHRINE 0.3 MG/0.3ML IJ SOAJ: INTRAMUSCULAR | 2 refills | Status: DC

## 2018-11-29 NOTE — Progress Notes (Signed)
[place] [address] Dept: [phone]  FOLLOW UP NOTE  Patient ID: Barbara Fuentes, female    DOB: [birth year]  Age: 53 y.o. MRN: [id number] Date of Office Visit: 11/29/2018  Assessment  Chief Complaint: Allergic Rhinitis   HPI Barbara Fuentes is a 53 year old female who presents to the clinic for a follow up visit. She was last seen in the clinic on 07/20/2016 by Dr. Shaune Leeks for evaluation of asthma and allergic rhinoconjunctivitis on allergen immunotherapy. At today's visit, she reports her allergic rhinitis has been well controlled with allergen immunotherapy and Allegra D once a day. She reports a significant improvement of her symptoms of allergic rhinitis while continuing allergen immunotherapy. She reports about 2 attacks occurring in the spring season each year for which she takes Benadryl for 1-2 days with resolution of her symptoms. She denies occular pruritus and is not using any medical intervention. She denies asthma symptoms and has not taken montelukast for over 2 years. She denies ever using an inhaler. Her current medications are listed in the chart.    Drug Allergies:  Allergies  Allergen Reactions  . Codeine     REACTION: nausea & vomiting  . Procaine Hcl     REACTION: tachycardia in family members!    Physical Exam: BP 98/60   Pulse 60   Temp 98.4 F (36.9 C) (Oral)   Resp 12   Ht 5' 6.34" (1.685 m)   Wt 154 lb 6.4 oz (70 kg)   LMP 04/25/2015 Comment: Uterine ablations  SpO2 99%   BMI 24.67 kg/m    Physical Exam Vitals signs reviewed.  Constitutional:      Appearance: Normal appearance.  HENT:     Head: Normocephalic and atraumatic.     Right Ear: Tympanic membrane normal.     Left Ear: Tympanic membrane normal.     Nose:     Comments: Bilateral nares slightly erythematous with clear nasal drainage noted. Pharynx normal. Ears normal. Eyes normal.    Mouth/Throat:     Pharynx: Oropharynx is clear.  Eyes:     Conjunctiva/sclera:  Conjunctivae normal.  Neck:     Musculoskeletal: Normal range of motion and neck supple.  Cardiovascular:     Rate and Rhythm: Normal rate.     Heart sounds: Normal heart sounds. No murmur.  Pulmonary:     Effort: Pulmonary effort is normal.     Breath sounds: Normal breath sounds.     Comments: Lungs clear to auscultation Musculoskeletal: Normal range of motion.  Skin:    General: Skin is warm and dry.  Neurological:     Mental Status: She is alert and oriented to person, place, and time.  Psychiatric:        Mood and Affect: Mood normal.        Behavior: Behavior normal.        Thought Content: Thought content normal.        Judgment: Judgment normal.     Diagnostics: FVC 3.43, FEV1 2.74. Predicted FVC 3.72, predicted FEV1 2.92. Spirometry indicates normal ventilatory function.   Assessment and Plan: 1. Seasonal and perennial allergic rhinitis   2. Seasonal allergic conjunctivitis   3. 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 History of asthma     Meds ordered this encounter  Medications  . EPINEPHrine (EPIPEN 2-PAK) 0.3 mg/0.3 mL IJ SOAJ injection    Sig: Use as directed for severe allergic reactions    Dispense:  2  each    Refill:  2    Dispense Mylan generic brand only    Patient Instructions  Allergic rhinitis Continue Allegra D once a day as needed for nasal symptoms Flonase 1-2 sprays in each nostril once a day as needed for a stuffy nose Consider nasal saline rinses as needed for nasal symptoms Continue allergy immunotherapy once every 2 weeks after your build up  Allergic conjunctivitis Continue Opcon-A eye drops 1-2 drops in each eye 3 times a day as needed for red, itchy eyes  Adverse food reaction Continue to avoid strawberries. Come to the clinic for a graded food challenge if interested.  Call the clinic if this treatment plan is not working well for you  Follow up in 1 year or sooner if needed   Return in about 1 year (around 11/29/2019), or if symptoms  worsen or fail to improve.   Thank you for the opportunity to care for this patient.  Please do not hesitate to contact me with questions.  Gareth Morgan, FNP [facility]  I have provided oversight concerning Gareth Morgan' evaluation and treatment of this patient's health issues addressed during today's encounter. I agree with the assessment and therapeutic plan as outlined in the note.   Thank you for the opportunity to care for this patient.  Please do not hesitate to contact me with questions.  Penne Lash, M.D.  [facility] [address] [place], [place] [address] [phone]

## 2018-11-29 NOTE — Patient Instructions (Addendum)
Allergic rhinitis Continue Allegra D once a day as needed for nasal symptoms Flonase 1-2 sprays in each nostril once a day as needed for a stuffy nose Consider nasal saline rinses as needed for nasal symptoms Continue allergy immunotherapy once every 2 weeks after your build up  Allergic conjunctivitis Continue Opcon-A eye drops 1-2 drops in each eye 3 times a day as needed for red, itchy eyes  Adverse food reaction Continue to avoid strawberries. Come to the clinic for a graded food challenge if interested.  Call the clinic if this treatment plan is not working well for you  Follow up in 1 year or sooner if needed

## 2018-11-30 NOTE — Addendum Note (Signed)
Addended by: Katherina Right D on: 11/30/2018 09:35 AM   Modules accepted: Orders

## 2018-12-06 ENCOUNTER — Ambulatory Visit (INDEPENDENT_AMBULATORY_CARE_PROVIDER_SITE_OTHER): Payer: BLUE CROSS/BLUE SHIELD

## 2018-12-06 DIAGNOSIS — J309 Allergic rhinitis, unspecified: Secondary | ICD-10-CM

## 2018-12-14 ENCOUNTER — Ambulatory Visit (INDEPENDENT_AMBULATORY_CARE_PROVIDER_SITE_OTHER): Payer: BLUE CROSS/BLUE SHIELD

## 2018-12-14 DIAGNOSIS — J309 Allergic rhinitis, unspecified: Secondary | ICD-10-CM

## 2018-12-19 ENCOUNTER — Ambulatory Visit (INDEPENDENT_AMBULATORY_CARE_PROVIDER_SITE_OTHER): Payer: BLUE CROSS/BLUE SHIELD

## 2018-12-19 DIAGNOSIS — J309 Allergic rhinitis, unspecified: Secondary | ICD-10-CM

## 2018-12-26 ENCOUNTER — Other Ambulatory Visit: Payer: Self-pay

## 2018-12-26 ENCOUNTER — Ambulatory Visit
Admission: RE | Admit: 2018-12-26 | Discharge: 2018-12-26 | Disposition: A | Discharge status: Home / Self Care | Payer: BLUE CROSS/BLUE SHIELD | Source: Ambulatory Visit | Attending: Obstetrics & Gynecology | Admitting: Obstetrics & Gynecology

## 2018-12-26 DIAGNOSIS — Z853 Personal history of malignant neoplasm of breast: Secondary | ICD-10-CM

## 2018-12-26 DIAGNOSIS — Z9889 Other specified postprocedural states: Secondary | ICD-10-CM

## 2018-12-29 ENCOUNTER — Ambulatory Visit (INDEPENDENT_AMBULATORY_CARE_PROVIDER_SITE_OTHER): Payer: BLUE CROSS/BLUE SHIELD

## 2018-12-29 DIAGNOSIS — J309 Allergic rhinitis, unspecified: Secondary | ICD-10-CM

## 2019-01-11 ENCOUNTER — Ambulatory Visit (INDEPENDENT_AMBULATORY_CARE_PROVIDER_SITE_OTHER): Payer: BLUE CROSS/BLUE SHIELD

## 2019-01-11 DIAGNOSIS — J309 Allergic rhinitis, unspecified: Secondary | ICD-10-CM

## 2019-01-23 DIAGNOSIS — J301 Allergic rhinitis due to pollen: Secondary | ICD-10-CM | POA: Diagnosis not present

## 2019-01-23 NOTE — Progress Notes (Signed)
VIALS EXP 01-23-20 

## 2019-01-24 ENCOUNTER — Ambulatory Visit (INDEPENDENT_AMBULATORY_CARE_PROVIDER_SITE_OTHER): Payer: BLUE CROSS/BLUE SHIELD

## 2019-01-24 DIAGNOSIS — J309 Allergic rhinitis, unspecified: Secondary | ICD-10-CM | POA: Diagnosis not present

## 2019-02-08 ENCOUNTER — Ambulatory Visit (INDEPENDENT_AMBULATORY_CARE_PROVIDER_SITE_OTHER): Payer: BLUE CROSS/BLUE SHIELD

## 2019-02-08 DIAGNOSIS — J309 Allergic rhinitis, unspecified: Secondary | ICD-10-CM | POA: Diagnosis not present

## 2019-02-22 ENCOUNTER — Ambulatory Visit (INDEPENDENT_AMBULATORY_CARE_PROVIDER_SITE_OTHER): Payer: BLUE CROSS/BLUE SHIELD

## 2019-02-22 DIAGNOSIS — J309 Allergic rhinitis, unspecified: Secondary | ICD-10-CM

## 2019-03-10 ENCOUNTER — Other Ambulatory Visit: Payer: Self-pay

## 2019-03-10 ENCOUNTER — Ambulatory Visit (INDEPENDENT_AMBULATORY_CARE_PROVIDER_SITE_OTHER): Payer: BLUE CROSS/BLUE SHIELD

## 2019-03-10 DIAGNOSIS — J309 Allergic rhinitis, unspecified: Secondary | ICD-10-CM

## 2019-03-10 MED ORDER — ALBUTEROL SULFATE HFA 108 (90 BASE) MCG/ACT IN AERS: 2.0000 | INHALATION_SPRAY | RESPIRATORY_TRACT | 1 refills | Status: DC | PRN

## 2019-03-10 NOTE — Telephone Encounter (Signed)
Pt. Wanting to get her proair hfa filled. We had prescribed it to her before, but pt. felt at that time she didn't need it. Now that she is going every other week with her allergy injections she feels she needs her rescue inhaler at times. Sent one into [facility] [place]

## 2019-03-22 ENCOUNTER — Ambulatory Visit (INDEPENDENT_AMBULATORY_CARE_PROVIDER_SITE_OTHER): Payer: BLUE CROSS/BLUE SHIELD

## 2019-03-22 DIAGNOSIS — J309 Allergic rhinitis, unspecified: Secondary | ICD-10-CM

## 2019-03-27 ENCOUNTER — Other Ambulatory Visit: Payer: Self-pay | Admitting: *Deleted

## 2019-03-27 MED ORDER — FLUTICASONE PROPIONATE 50 MCG/ACT NA SUSP: 1.0000 | Freq: Every day | NASAL | 5 refills | Status: DC | PRN

## 2019-04-03 ENCOUNTER — Telehealth: Payer: Self-pay | Admitting: Obstetrics & Gynecology

## 2019-04-03 DIAGNOSIS — D251 Intramural leiomyoma of uterus: Secondary | ICD-10-CM

## 2019-04-03 NOTE — Telephone Encounter (Signed)
Spoke with pt. Pt needing to reschedule AEX and PUS. Pt scheduled for both 04/25/19 at 3pm for PUS and AEX at 3:30pm with Dr Sabra Heck.   Will route to Dr Sabra Heck for final review and will close encounter.   CC: Weston Brass for precert for [place], orders placed.

## 2019-04-03 NOTE — Telephone Encounter (Signed)
Patient is calling to reschedule PUS.

## 2019-04-04 ENCOUNTER — Ambulatory Visit (INDEPENDENT_AMBULATORY_CARE_PROVIDER_SITE_OTHER): Payer: BLUE CROSS/BLUE SHIELD

## 2019-04-04 DIAGNOSIS — J309 Allergic rhinitis, unspecified: Secondary | ICD-10-CM | POA: Diagnosis not present

## 2019-04-06 ENCOUNTER — Other Ambulatory Visit: Payer: Self-pay | Admitting: Obstetrics & Gynecology

## 2019-04-06 ENCOUNTER — Other Ambulatory Visit: Payer: Self-pay

## 2019-04-11 ENCOUNTER — Ambulatory Visit (INDEPENDENT_AMBULATORY_CARE_PROVIDER_SITE_OTHER): Payer: BLUE CROSS/BLUE SHIELD

## 2019-04-11 DIAGNOSIS — J309 Allergic rhinitis, unspecified: Secondary | ICD-10-CM

## 2019-04-18 ENCOUNTER — Ambulatory Visit (INDEPENDENT_AMBULATORY_CARE_PROVIDER_SITE_OTHER): Payer: BLUE CROSS/BLUE SHIELD

## 2019-04-18 DIAGNOSIS — J309 Allergic rhinitis, unspecified: Secondary | ICD-10-CM

## 2019-04-25 ENCOUNTER — Ambulatory Visit (INDEPENDENT_AMBULATORY_CARE_PROVIDER_SITE_OTHER): Payer: BLUE CROSS/BLUE SHIELD | Admitting: Obstetrics & Gynecology

## 2019-04-25 ENCOUNTER — Encounter: Payer: Self-pay | Admitting: Obstetrics & Gynecology

## 2019-04-25 ENCOUNTER — Ambulatory Visit (INDEPENDENT_AMBULATORY_CARE_PROVIDER_SITE_OTHER): Payer: BLUE CROSS/BLUE SHIELD

## 2019-04-25 ENCOUNTER — Other Ambulatory Visit: Payer: Self-pay

## 2019-04-25 VITALS — BP 104/62 | HR 72 | Temp 97.2°F | Resp 16 | Ht 66.25 in | Wt 156.0 lb

## 2019-04-25 DIAGNOSIS — D251 Intramural leiomyoma of uterus: Secondary | ICD-10-CM

## 2019-04-25 DIAGNOSIS — Z01419 Encounter for gynecological examination (general) (routine) without abnormal findings: Secondary | ICD-10-CM | POA: Diagnosis not present

## 2019-04-25 DIAGNOSIS — J309 Allergic rhinitis, unspecified: Secondary | ICD-10-CM

## 2019-04-25 NOTE — Progress Notes (Signed)
53 y.o. [id number]:[id number] [name] here for annual exam.  Doing well.  Denies vaginal bleeding.    Ultrasound was performed today due to new diagnosis of fibroid noted a little over a year ago.  Follow up ultrasound in six month showed larger fibroid was a little increased in size.  However, she is no fully menopausal based on [facility] and [facility]H levels.  Does have amenorrhea due to having an endometrial ablation.  Uterus:  10.1 x 7.2 x 5.2cm with 5.0 x 4.5cm fibroid (minimal change), 0.6 x 0.6cm fibroid, and 1.2 x 1.3cm fibroid Endometrium:  2.42mm Left ovary:  2.8 x 2.9 x [id number] with 0000000 follicle Right ovary:  2.1 x 1.5 x 2.0cm  Patient's last menstrual period was 04/25/2015.          Sexually active: Yes.    The current method of family planning is post menopausal status.    Exercising: Yes.    walking, farmwork Smoker:  no  Health Maintenance: Pap:  01-17-2018 neg HPV HR neg History of abnormal Pap:  Yes, "years ago" MMG:  12-26-2018  Category c density birads 2:neg Colonoscopy:  2019 polyp f/u 11yrs BMD:   none TDaP:  2014 Pneumonia vaccine(s):  no Shingrix:   no Hep C testing: not done Screening Labs: 05/2017   reports that she quit smoking about 8 years ago. Her smoking use included cigarettes. She quit after 28.00 years of use. She has never used smokeless tobacco. She reports current alcohol use of about 3.0 - 4.0 standard drinks of alcohol per week. She reports that she does not use drugs.  Past Medical History:  Diagnosis Date  . Abnormal Pap smear of cervix    before children  . Alkaline phosphatase deficiency    28 in 9/11  . Allergic state 08/23/2015  . Arthritis    hip  . Breast cancer of lower-outer quadrant of right female breast ([place]) 12/20/2015   right breast  . Eustachian tube dysfunction 02/2014  . H/O recurrent pneumonia 08/23/2015  . History of chicken pox 08/23/2015  . History of radiation therapy 03/05/16- 04/02/16   Right Breast treated to 40.05 Gy  in 15 fractions & Right Breast boost to 10 Gy in 5 fractions.   . Low BP   . Personal history of radiation therapy    2017 right breast  . Pneumonia   . RAD (reactive airway disease)     FORMER smoker , RAD with RTI's, cat or allergen exposure  . Sacral pain 2014   trauma ( fall)    Past Surgical History:  Procedure Laterality Date  . BREAST LUMPECTOMY Right 01/20/2016   Procedure: RIGHT BREAST LUMPECTOMY;  Surgeon: [name], MD;  Location: [facility];  Service: General;  Laterality: Right;  . BREAST LUMPECTOMY WITH RADIOACTIVE SEED LOCALIZATION Right 01/10/2016   Procedure: BREAST LUMPECTOMY WITH RADIOACTIVE SEED LOCALIZATION;  Surgeon: [name], MD;  Location: [place];  Service: General;  Laterality: Right;  . CERVICAL FUSION  07/30/2010   & Discectomy, Dr Vertell Limber ( @ 3 levels)/has plate and 6 screws  . SEPTOPLASTY  1983   for septal deviation  . TONSILLECTOMY    . uterine ablation  2008   Dr Ronita Hipps  . WISDOM TOOTH EXTRACTION      Current Outpatient Medications  Medication Sig Dispense Refill  . albuterol (PROAIR HFA) 108 (90 Base) MCG/ACT inhaler Inhale 2 puffs into the lungs every 4 (four) hours as needed for wheezing or  shortness of breath. 18 g 1  . cholecalciferol (VITAMIN D) 1000 units tablet Take 1,000 Units by mouth daily.    . Cyanocobalamin (VITAMIN B 12 PO) Take by mouth daily.    Marland Kitchen EPINEPHrine (EPIPEN 2-PAK) 0.3 mg/0.3 mL IJ SOAJ injection Use as directed for severe allergic reactions 2 each 2  . fexofenadine-pseudoephedrine (ALLEGRA-D 24) 180-240 MG 24 hr tablet Take 1 tablet by mouth daily.    . fluticasone (FLONASE) 50 MCG/ACT nasal spray Place 1-2 sprays into both nostrils daily as needed (for stuffy nose). 16 g 5  . NON FORMULARY IMMUNOTHERAPY    . Probiotic Product ([place]) Take by mouth. Take one pill daily     Current Facility-Administered Medications  Medication Dose Route Frequency Provider Last Rate Last Admin  . 0.9  %  sodium chloride infusion  500 mL Intravenous Once Armbruster, Carlota Raspberry, MD        Family History  Problem Relation Age of Onset  . Diabetes Father        Type II, AO  . Alcohol abuse Father   . Depression Father   . Prostate cancer Father 59  . Lung cancer Father 92  . Depression Brother   . HIV Brother        AIDS  . Heart attack Paternal Grandfather 37  . Heart disease Paternal Grandfather   . Alcohol abuse Paternal Grandfather        smoker  . Colon cancer Maternal Grandfather        dx late 55s; w/ colostomy  . COPD Paternal Grandmother   . Alcohol abuse Paternal Grandmother        smoker  . Emphysema Paternal Grandmother   . Bronchitis Paternal Grandmother   . Heart disease Mother        cardiac rest, epinephrine with Novocaine  . Arthritis Mother   . Other Maternal Grandmother        issues w/ breast in her 90s--may have been breast cancer  . Stroke Neg Hx   . Asthma Neg Hx   . Allergic rhinitis Neg Hx   . Angioedema Neg Hx   . Eczema Neg Hx   . Immunodeficiency Neg Hx   . Urticaria Neg Hx     Review of Systems  All other systems reviewed and are negative.   Exam:   Vitals:   04/25/19 1528  BP: 104/62  Pulse: 72  Resp: 16  Temp: (!) 97.2 F (36.2 C)   General appearance: alert, cooperative and appears stated age Head: Normocephalic, without obvious abnormality, atraumatic Neck: no adenopathy, supple, symmetrical, trachea midline and thyroid normal to inspection and palpation Lungs: clear to auscultation bilaterally Breasts: normal appearance, no masses or tenderness Heart: regular rate and rhythm Abdomen: soft, non-tender; bowel sounds normal; no masses,  no organomegaly Extremities: extremities normal, atraumatic, no cyanosis or edema Skin: Skin color, texture, turgor normal. No rashes or lesions Lymph nodes: Cervical, supraclavicular, and axillary nodes normal. No abnormal inguinal nodes palpated Neurologic: Grossly normal   Pelvic:  External genitalia:  no lesions              Urethra:  normal appearing urethra with no masses, tenderness or lesions              Bartholins and Skenes: normal                 Vagina: normal appearing vagina with normal color and discharge, no lesions  Cervix: absent              Pap taken: No. Bimanual Exam:  Uterus:  enlarged, 10 weeks size, mobile and stable in size              Adnexa: normal adnexa and no mass, fullness, tenderness               Rectovaginal: Confirms               Anus:  normal sphincter tone, no lesions  Chaperone, Terence Lux, CMA, was present for exam.  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  Doing yearly pap smear with neg HR HPV 2019.  Not indicated today Order for shingrix vaccination given Colonoscopy due 2022 With relatively stable ultrasound today, will follow with physical exams going forward.  She knows to call with any new pain, pelvic pressure or VB Lab work with be done with Dr. Randel Pigg at next visit return annually or prn  Additional 10 minutes spent reviewing ultrasound in addition to routine gyn exam and related concerns.  The ultrasound discussion was all face to face discussion.

## 2019-05-10 ENCOUNTER — Ambulatory Visit (INDEPENDENT_AMBULATORY_CARE_PROVIDER_SITE_OTHER): Payer: BLUE CROSS/BLUE SHIELD

## 2019-05-10 DIAGNOSIS — J309 Allergic rhinitis, unspecified: Secondary | ICD-10-CM | POA: Diagnosis not present

## 2019-05-11 ENCOUNTER — Telehealth: Payer: Self-pay | Admitting: Family Medicine

## 2019-05-22 ENCOUNTER — Ambulatory Visit (INDEPENDENT_AMBULATORY_CARE_PROVIDER_SITE_OTHER): Payer: BLUE CROSS/BLUE SHIELD

## 2019-05-22 DIAGNOSIS — J309 Allergic rhinitis, unspecified: Secondary | ICD-10-CM | POA: Diagnosis not present

## 2019-05-31 DIAGNOSIS — U071 COVID-19: Secondary | ICD-10-CM | POA: Insufficient documentation

## 2019-06-12 ENCOUNTER — Telehealth: Payer: Self-pay | Admitting: Obstetrics & Gynecology

## 2019-06-12 NOTE — Telephone Encounter (Signed)
Patient would like to know if we have her blood type or her children's blood types on file? 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

## 2019-06-12 NOTE — Telephone Encounter (Signed)
Spoke to pt. Pt asking about blood type. Pt informed that we do not have those labs at our office. Pt to contact either [place] or PCP. Pt agreeable.   Encounter closed.

## 2019-06-13 ENCOUNTER — Ambulatory Visit (INDEPENDENT_AMBULATORY_CARE_PROVIDER_SITE_OTHER): Payer: BLUE CROSS/BLUE SHIELD

## 2019-06-13 DIAGNOSIS — J309 Allergic rhinitis, unspecified: Secondary | ICD-10-CM | POA: Diagnosis not present

## 2019-06-26 ENCOUNTER — Ambulatory Visit (INDEPENDENT_AMBULATORY_CARE_PROVIDER_SITE_OTHER): Payer: BLUE CROSS/BLUE SHIELD

## 2019-06-26 DIAGNOSIS — J309 Allergic rhinitis, unspecified: Secondary | ICD-10-CM

## 2019-07-03 DIAGNOSIS — J301 Allergic rhinitis due to pollen: Secondary | ICD-10-CM

## 2019-07-03 NOTE — Progress Notes (Signed)
Vials exp 07-02-20

## 2019-07-11 ENCOUNTER — Ambulatory Visit (INDEPENDENT_AMBULATORY_CARE_PROVIDER_SITE_OTHER): Payer: BLUE CROSS/BLUE SHIELD | Admitting: *Deleted

## 2019-07-11 DIAGNOSIS — J309 Allergic rhinitis, unspecified: Secondary | ICD-10-CM

## 2019-07-12 ENCOUNTER — Other Ambulatory Visit: Payer: Self-pay

## 2019-07-13 ENCOUNTER — Ambulatory Visit: Payer: BLUE CROSS/BLUE SHIELD | Admitting: Family Medicine

## 2019-08-01 ENCOUNTER — Ambulatory Visit (INDEPENDENT_AMBULATORY_CARE_PROVIDER_SITE_OTHER): Payer: BLUE CROSS/BLUE SHIELD

## 2019-08-01 DIAGNOSIS — J309 Allergic rhinitis, unspecified: Secondary | ICD-10-CM | POA: Diagnosis not present

## 2019-08-14 ENCOUNTER — Ambulatory Visit (INDEPENDENT_AMBULATORY_CARE_PROVIDER_SITE_OTHER): Payer: BLUE CROSS/BLUE SHIELD

## 2019-08-14 DIAGNOSIS — J309 Allergic rhinitis, unspecified: Secondary | ICD-10-CM | POA: Diagnosis not present

## 2019-08-22 ENCOUNTER — Ambulatory Visit (INDEPENDENT_AMBULATORY_CARE_PROVIDER_SITE_OTHER): Payer: BLUE CROSS/BLUE SHIELD | Admitting: Family Medicine

## 2019-08-22 ENCOUNTER — Other Ambulatory Visit: Payer: Self-pay

## 2019-08-22 ENCOUNTER — Encounter: Payer: Self-pay | Admitting: Family Medicine

## 2019-08-22 VITALS — BP 98/70 | HR 76 | Temp 98.3°F | Resp 12 | Ht 66.0 in | Wt 155.0 lb

## 2019-08-22 DIAGNOSIS — R0681 Apnea, not elsewhere classified: Secondary | ICD-10-CM

## 2019-08-22 DIAGNOSIS — Z0000 Encounter for general adult medical examination without abnormal findings: Secondary | ICD-10-CM

## 2019-08-22 DIAGNOSIS — R5383 Other fatigue: Secondary | ICD-10-CM | POA: Diagnosis not present

## 2019-08-22 DIAGNOSIS — R0683 Snoring: Secondary | ICD-10-CM | POA: Diagnosis not present

## 2019-08-22 DIAGNOSIS — G4700 Insomnia, unspecified: Secondary | ICD-10-CM | POA: Diagnosis not present

## 2019-08-22 DIAGNOSIS — J309 Allergic rhinitis, unspecified: Secondary | ICD-10-CM

## 2019-08-22 NOTE — Assessment & Plan Note (Signed)
Patient encouraged to maintain heart healthy diet, regular exercise, adequate sleep. Consider daily probiotics. Take medications as prescribed. Labs ordered and reviewed 

## 2019-08-22 NOTE — Patient Instructions (Addendum)
[employer]ron Blood Pressure cuff, upper arm, want BP 100-140/60-90 Pulse oximeter, want oxygen in 90s  Weekly vitals  Take Multivitamin with minerals, selenium Vitamin D 1000-2000 IU daily Probiotic with lactobacillus and bifidophilus Asprin EC 81 mg daily Fish or krill oil caps daily Melatonin 2-5 mg at bedtime  [email or web address]   Preventive Care 59-55 Years Old, Female Preventive care refers to visits with your health care provider and lifestyle choices that can promote health and wellness. This includes:  A yearly physical exam. This may also be called an annual well check.  Regular dental visits and eye exams.  Immunizations.  Screening for certain conditions.  Healthy lifestyle choices, such as eating a healthy diet, getting regular exercise, not using drugs or products that contain nicotine and tobacco, and limiting alcohol use. What can I expect for my preventive care visit? Physical exam Your health care provider will check your:  Height and weight. This may be used to calculate body mass index (BMI), which tells if you are at a healthy weight.  Heart rate and blood pressure.   Skin for abnormal spots. Counseling Your health care provider may ask you questions about your:  Alcohol, tobacco, and drug use.  Emotional well-being.  Home and relationship well-being.  Sexual activity.  Eating habits.  Work and work Statistician.  Method of birth control.  Menstrual cycle.  Pregnancy history. What immunizations do I need?  Influenza (flu) vaccine  This is recommended every year. Tetanus, diphtheria, and pertussis (Tdap) vaccine  You may need a Td booster every 10 years. Varicella (chickenpox) vaccine  You may need this if you have not been vaccinated. Zoster (shingles) vaccine  You may need this after age 85. Measles, mumps, and rubella (MMR) vaccine  You may need at least one dose of MMR if you were born in 1957  or later. You may also need a second dose. Pneumococcal conjugate (PCV13) vaccine  You may need this if you have certain conditions and were not previously vaccinated. Pneumococcal polysaccharide (PPSV23) vaccine  You may need one or two doses if you smoke cigarettes or if you have certain conditions. Meningococcal conjugate (MenACWY) vaccine  You may need this if you have certain conditions. Hepatitis A vaccine  You may need this if you have certain conditions or if you travel or work in places where you may be exposed to hepatitis A. Hepatitis B vaccine  You may need this if you have certain conditions or if you travel or work in places where you may be exposed to hepatitis B. Haemophilus influenzae type b (Hib) vaccine  You may need this if you have certain conditions. Human papillomavirus (HPV) vaccine  If recommended by your health care provider, you may need three doses over 6 months. You may receive vaccines as individual doses or as more than one vaccine together in one shot (combination vaccines). Talk with your health care provider about the risks and benefits of combination vaccines. What tests do I need? Blood tests  Lipid and cholesterol levels. These may be checked every 5 years, or more frequently if you are over 46 years old.  Hepatitis C test.  Hepatitis B test. Screening  Lung cancer screening. You may have this screening every year starting at age 34 if you have a 30-pack-year history of smoking and currently smoke or have quit within the past 15 years.  Colorectal cancer screening. All adults should have this screening starting at age 66 and continuing until age 30. Your health  care provider may recommend screening at age 22 if you are at increased risk. You will have tests every 1-10 years, depending on your results and the type of screening test.  Diabetes screening. This is done by checking your blood sugar (glucose) after you have not eaten for a while  (fasting). You may have this done every 1-3 years.  Mammogram. This may be done every 1-2 years. Talk with your health care provider about when you should start having regular mammograms. This may depend on whether you have a family history of breast cancer.  BRCA-related cancer screening. This may be done if you have a family history of breast, ovarian, tubal, or peritoneal cancers.  Pelvic exam and Pap test. This may be done every 3 years starting at age 50. Starting at age 61, this may be done every 5 years if you have a Pap test in combination with an HPV test. Other tests  Sexually transmitted disease (STD) testing.  Bone density scan. This is done to screen for osteoporosis. You may have this scan if you are at high risk for osteoporosis. Follow these instructions at home: Eating and drinking  Eat a diet that includes fresh fruits and vegetables, whole grains, lean protein, and low-fat dairy.  Take vitamin and mineral supplements as recommended by your health care provider.  Do not drink alcohol if: ? Your health care provider tells you not to drink. ? You are pregnant, may be pregnant, or are planning to become pregnant.  If you drink alcohol: ? Limit how much you have to 0-1 drink a day. ? Be aware of how much alcohol is in your drink. In the [place] one drink equals one 12 oz bottle of beer (355 mL), one 5 oz glass of wine (148 mL), or one 1 oz glass of hard liquor (44 mL). Lifestyle  Take daily care of your teeth and gums.  Stay active. Exercise for at least 30 minutes on 5 or more days each week.  Do not use any products that contain nicotine or tobacco, such as cigarettes, e-cigarettes, and chewing tobacco. If you need help quitting, ask your health care provider.  If you are sexually active, practice safe sex. Use a condom or other form of birth control (contraception) in order to prevent pregnancy and STIs (sexually transmitted infections).  If told by your health  care provider, take low-dose aspirin daily starting at age 59. What's next?  Visit your health care provider once a year for a well check visit.  Ask your health care provider how often you should have your eyes and teeth checked.  Stay up to date on all vaccines. This information is not intended to replace advice given to you by your health care provider. Make sure you discuss any questions you have with your health care provider. Document Revised: 12/23/2017 Document Reviewed: 12/23/2017 Elsevier Patient Education  2020 [employer].

## 2019-08-23 DIAGNOSIS — G4700 Insomnia, unspecified: Secondary | ICD-10-CM | POA: Insufficient documentation

## 2019-08-23 DIAGNOSIS — R0683 Snoring: Secondary | ICD-10-CM | POA: Insufficient documentation

## 2019-08-23 LAB — LIPID PANEL
Cholesterol: 183 mg/dL (ref 0–200)
HDL: 69.9 mg/dL (ref >39.00)
LDL Cholesterol: 100 mg/dL — ABNORMAL HIGH (ref 0–99)
NonHDL: 112.89
Total CHOL/HDL Ratio: 3
Triglycerides: 64 mg/dL (ref 0.0–149.0)
VLDL: 12.8 mg/dL (ref 0.0–40.0)

## 2019-08-23 LAB — CBC
HCT: 35.6 % — ABNORMAL LOW (ref 36.0–46.0)
Hemoglobin: 12.1 g/dL (ref 12.0–15.0)
MCHC: 33.9 g/dL (ref 30.0–36.0)
MCV: 92.2 fl (ref 78.0–100.0)
Platelets: 309 10*3/uL (ref 150.0–400.0)
RBC: 3.86 Mil/uL — ABNORMAL LOW (ref 3.87–5.11)
RDW: 13 % (ref 11.5–15.5)
WBC: 6.2 10*3/uL (ref 4.0–10.5)

## 2019-08-23 LAB — COMPREHENSIVE METABOLIC PANEL
ALT: 10 U/L (ref 0–35)
AST: 13 U/L (ref 0–37)
Albumin: 4.2 g/dL (ref 3.5–5.2)
Alkaline Phosphatase: 37 U/L — ABNORMAL LOW (ref 39–117)
BUN: 9 mg/dL (ref 6–23)
CO2: 28 mEq/L (ref 19–32)
Calcium: 8.9 mg/dL (ref 8.4–10.5)
Chloride: 103 mEq/L (ref 96–112)
Creatinine, Ser: 0.7 mg/dL (ref 0.40–1.20)
GFR: 86.95 mL/min (ref >60.00)
Glucose, Bld: 90 mg/dL (ref 70–99)
Potassium: 4.1 mEq/L (ref 3.5–5.1)
Sodium: 138 mEq/L (ref 135–145)
Total Bilirubin: 0.4 mg/dL (ref 0.2–1.2)
Total Protein: 6.4 g/dL (ref 6.0–8.3)

## 2019-08-23 LAB — TSH: TSH: 2.29 u[IU]/mL (ref 0.35–4.50)

## 2019-08-23 NOTE — Assessment & Plan Note (Signed)
Encouraged good sleep hygiene such as dark, quiet room. No blue/green glowing lights such as computer screens in bedroom. No alcohol or stimulants in evening. Cut down on caffeine as able. Regular exercise is helpful but not just prior to bed time.  

## 2019-08-23 NOTE — Assessment & Plan Note (Signed)
Flared some using Fexofenadine, benadryl and Flonase prn.

## 2019-08-23 NOTE — Progress Notes (Signed)
Subjective:    Patient ID: Barbara Fuentes, female    DOB: 1965-09-08, 54 y.o.   MRN: [id number]  Chief Complaint  Patient presents with  . Annual Exam    HPI Patient is in today for annual preventative exam. No recent febrile illness or hospitalization. She has been managing the pandemic well. She has had her [place] shots and tolerated it well. She is exercising and staying active. She is eating a heart healthy diet. Denies CP/palp/HA/congestion/fevers/GI or GU c/o. Taking meds as prescribed. Her greatest concern is insomnia, restless sleep, fatigue, waking up apneic/choking at times.   Past Medical History:  Diagnosis Date  . Abnormal Pap smear of cervix    before children  . Alkaline phosphatase deficiency    28 in 9/11  . Allergic state 08/23/2015  . Arthritis    hip  . Breast cancer of lower-outer quadrant of right female breast ([facility]) 12/20/2015   right breast  . Eustachian tube dysfunction 02/2014  . H/O recurrent pneumonia 08/23/2015  . History of chicken pox 08/23/2015  . History of radiation therapy 03/05/16- 04/02/16   Right Breast treated to 40.05 Gy in 15 fractions & Right Breast boost to 10 Gy in 5 fractions.   . Low BP   . Personal history of radiation therapy    2017 right breast  . Pneumonia   . RAD (reactive airway disease)     FORMER smoker , RAD with RTI's, cat or allergen exposure  . Sacral pain 2014   trauma ( fall)    Past Surgical History:  Procedure Laterality Date  . BREAST LUMPECTOMY Right 01/20/2016   Procedure: RIGHT BREAST LUMPECTOMY;  Surgeon: [name], MD;  Location: [facility];  Service: General;  Laterality: Right;  . BREAST LUMPECTOMY WITH RADIOACTIVE SEED LOCALIZATION Right 01/10/2016   Procedure: BREAST LUMPECTOMY WITH RADIOACTIVE SEED LOCALIZATION;  Surgeon: [name], MD;  Location: [facility];  Service: General;  Laterality: Right;  . CERVICAL FUSION  07/30/2010   & Discectomy, Dr Vertell Limber ( @ 3 levels)/has plate and 6  screws  . SEPTOPLASTY  1983   for septal deviation  . TONSILLECTOMY    . uterine ablation  2008   Dr Ronita Hipps  . WISDOM TOOTH EXTRACTION      Family History  Problem Relation Age of Onset  . Diabetes Father        Type II, AO  . Alcohol abuse Father   . Depression Father   . Prostate cancer Father 6  . Lung cancer Father 30  . Depression Brother   . HIV Brother        AIDS  . Heart attack Paternal Grandfather 12  . Heart disease Paternal Grandfather   . Alcohol abuse Paternal Grandfather        smoker  . Colon cancer Maternal Grandfather        dx late 53s; w/ colostomy  . COPD Paternal Grandmother   . Alcohol abuse Paternal Grandmother        smoker  . Emphysema Paternal Grandmother   . Bronchitis Paternal Grandmother   . Heart disease Mother        cardiac rest, epinephrine with Novocaine  . Arthritis Mother   . Other Maternal Grandmother        issues w/ breast in her 90s--may have been breast cancer  . Stroke Neg Hx   . Asthma Neg Hx   . Allergic rhinitis Neg Hx   . Angioedema  Neg Hx   . Eczema Neg Hx   . Immunodeficiency Neg Hx   . Urticaria Neg Hx     Social History   Socioeconomic History  . Marital status: Married    Spouse name: Not on file  . Number of children: Not on file  . Years of education: Not on file  . Highest education level: Not on file  Occupational History  . Occupation: Chemical engineer   Tobacco Use  . Smoking status: Former Smoker    Years: 28.00    Types: Cigarettes    Quit date: 06/18/2010    Years since quitting: 9.1  . Smokeless tobacco: Never Used  . Tobacco comment: smoked age [age] up to 1 ppd  Substance and Sexual Activity  . Alcohol use: Yes  . Drug use: No  . Sexual activity: Yes    Birth control/protection: Post-menopausal  Other Topics Concern  . Not on file  Social History Narrative  . Not on file   Social Determinants of Health   Financial Resource Strain:   . Difficulty of Paying Living Expenses:   Food  Insecurity:   . Worried About Charity fundraiser in the Last Year:   . Arboriculturist in the Last Year:   Transportation Needs:   . Film/video editor (Medical):   Marland Kitchen Lack of Transportation (Non-Medical):   Physical Activity:   . Days of Exercise per Week:   . Minutes of Exercise per Session:   Stress:   . Feeling of Stress :   Social Connections:   . Frequency of Communication with Friends and Family:   . Frequency of Social Gatherings with Friends and Family:   . Attends Religious Services:   . Active Member of Clubs or Organizations:   . Attends Archivist Meetings:   Marland Kitchen Marital Status:   Intimate Partner Violence:   . Fear of Current or Ex-Partner:   . Emotionally Abused:   Marland Kitchen Physically Abused:   . Sexually Abused:     Outpatient Medications Prior to Visit  Medication Sig Dispense Refill  . albuterol (PROAIR HFA) 108 (90 Base) MCG/ACT inhaler Inhale 2 puffs into the lungs every 4 (four) hours as needed for wheezing or shortness of breath. 18 g 1  . EPINEPHrine (EPIPEN 2-PAK) 0.3 mg/0.3 mL IJ SOAJ injection Use as directed for severe allergic reactions 2 each 2  . fexofenadine-pseudoephedrine (ALLEGRA-D 24) 180-240 MG 24 hr tablet Take 1 tablet by mouth daily.    . fluticasone (FLONASE) 50 MCG/ACT nasal spray Place 1-2 sprays into both nostrils daily as needed (for stuffy nose). 16 g 5  . Probiotic Product ([place]) Take by mouth. Take one pill daily    . UNABLE TO FIND Take 2 capsules by mouth daily. Med Name: Essential for Women Multivitamin    . cholecalciferol (VITAMIN D) 1000 units tablet Take 1,000 Units by mouth daily.    . Cyanocobalamin (VITAMIN B 12 PO) Take by mouth daily.     No facility-administered medications prior to visit.    Allergies  Allergen Reactions  . Codeine     REACTION: nausea & vomiting  . Procaine Hcl     REACTION: tachycardia in family members!    Review of Systems  Constitutional: Positive for  malaise/fatigue. Negative for chills and fever.  HENT: Negative for congestion and hearing loss.   Eyes: Negative for discharge.  Respiratory: Positive for shortness of breath. Negative for cough and sputum  production.   Cardiovascular: Negative for chest pain, palpitations and leg swelling.  Gastrointestinal: Negative for abdominal pain, blood in stool, constipation, diarrhea, heartburn, nausea and vomiting.  Genitourinary: Negative for dysuria, frequency, hematuria and urgency.  Musculoskeletal: Negative for back pain, falls and myalgias.  Skin: Negative for rash.  Neurological: Negative for dizziness, sensory change, loss of consciousness, weakness and headaches.  Endo/Heme/Allergies: Negative for environmental allergies. Does not bruise/bleed easily.  Psychiatric/Behavioral: Negative for depression and suicidal ideas. The patient has insomnia. The patient is not nervous/anxious.        Objective:    Physical Exam Constitutional:      General: She is not in acute distress.    Appearance: She is well-developed.  HENT:     Head: Normocephalic and atraumatic.  Eyes:     Conjunctiva/sclera: Conjunctivae normal.  Neck:     Thyroid: No thyromegaly.  Cardiovascular:     Rate and Rhythm: Normal rate and regular rhythm.     Heart sounds: Normal heart sounds. No murmur.  Pulmonary:     Effort: Pulmonary effort is normal. No respiratory distress.     Breath sounds: Normal breath sounds.  Abdominal:     General: Bowel sounds are normal. There is no distension.     Palpations: Abdomen is soft. There is no mass.     Tenderness: There is no abdominal tenderness.  Musculoskeletal:     Cervical back: Neck supple.  Lymphadenopathy:     Cervical: No cervical adenopathy.  Skin:    General: Skin is warm and dry.  Neurological:     Mental Status: She is alert and oriented to person, place, and time.  Psychiatric:        Behavior: Behavior normal.     BP 98/70 (BP Location: Right Arm,  Cuff Size: Normal)   Pulse 76   Temp 98.3 F (36.8 C) (Temporal)   Resp 12   Ht _0  (1.676 m)   Wt 155 lb (70.3 kg)   LMP 04/25/2015 Comment: Uterine ablations  SpO2 98%   BMI 25.02 kg/m  Wt Readings from Last 3 Encounters:  08/22/19 155 lb (70.3 kg)  04/25/19 156 lb (70.8 kg)  11/29/18 154 lb 6.4 oz (70 kg)    Diabetic Foot Exam - Simple   No data filed     Lab Results  Component Value Date   WBC 6.2 08/22/2019   HGB 12.1 08/22/2019   HCT 35.6 (L) 08/22/2019   PLT 309.0 08/22/2019   GLUCOSE 90 08/22/2019   CHOL 183 08/22/2019   TRIG 64.0 08/22/2019   HDL 69.90 08/22/2019   LDLCALC 100 (H) 08/22/2019   ALT 10 08/22/2019   AST 13 08/22/2019   NA 138 08/22/2019   K 4.1 08/22/2019   CL 103 08/22/2019   CREATININE 0.70 08/22/2019   BUN 9 08/22/2019   CO2 28 08/22/2019   TSH 2.29 08/22/2019   HGBA1C 5.4 08/10/2014    Lab Results  Component Value Date   TSH 2.29 08/22/2019   Lab Results  Component Value Date   WBC 6.2 08/22/2019   HGB 12.1 08/22/2019   HCT 35.6 (L) 08/22/2019   MCV 92.2 08/22/2019   PLT 309.0 08/22/2019   Lab Results  Component Value Date   NA 138 08/22/2019   K 4.1 08/22/2019   CHLORIDE 106 12/25/2015   CO2 28 08/22/2019   GLUCOSE 90 08/22/2019   BUN 9 08/22/2019   CREATININE 0.70 08/22/2019   BILITOT 0.4 08/22/2019  ALKPHOS 37 (L) 08/22/2019   AST 13 08/22/2019   ALT 10 08/22/2019   PROT 6.4 08/22/2019   ALBUMIN 4.2 08/22/2019   CALCIUM 8.9 08/22/2019   ANIONGAP 10 12/25/2015   EGFR >90 12/25/2015   GFR 86.95 08/22/2019   Lab Results  Component Value Date   CHOL 183 08/22/2019   Lab Results  Component Value Date   HDL 69.90 08/22/2019   Lab Results  Component Value Date   LDLCALC 100 (H) 08/22/2019   Lab Results  Component Value Date   TRIG 64.0 08/22/2019   Lab Results  Component Value Date   CHOLHDL 3 08/22/2019   Lab Results  Component Value Date   HGBA1C 5.4 08/10/2014       Assessment & Plan:    Problem List Items Addressed This Visit    Preventative health care - Primary    Patient encouraged to maintain heart healthy diet, regular exercise, adequate sleep. Consider daily probiotics. Take medications as prescribed. Labs ordered and reviewed      Relevant Orders   Comprehensive metabolic panel (Completed)   CBC (Completed)   Lipid panel (Completed)   TSH (Completed)   Allergic rhinitis    Flared some using Fexofenadine, benadryl and Flonase prn.       Snoring    Worsening snoring and some episodes of waking up choking possibly from an apneic episode. Also notes excessive fatigue and foggy thinking. Referred to pulmonology for possible sleep study      Relevant Orders   Ambulatory referral to Pulmonology   Insomnia    Encouraged good sleep hygiene such as dark, quiet room. No blue/green glowing lights such as computer screens in bedroom. No alcohol or stimulants in evening. Cut down on caffeine as able. Regular exercise is helpful but not just prior to bed time.       Relevant Orders   Ambulatory referral to Pulmonology    Other Visit Diagnoses    Fatigue, unspecified type       Relevant Orders   Ambulatory referral to Pulmonology   Episode of apnea       Relevant Orders   Ambulatory referral to Pulmonology      I have discontinued Koraima H. Schnoebelen's cholecalciferol and Cyanocobalamin (VITAMIN B 12 PO). I am also having her maintain her fexofenadine-pseudoephedrine, Probiotic Product ([place]), EPINEPHrine, albuterol, fluticasone, and UNABLE TO FIND.  No orders of the defined types were placed in this encounter.    [name], MD

## 2019-08-23 NOTE — Assessment & Plan Note (Signed)
Worsening snoring and some episodes of waking up choking possibly from an apneic episode. Also notes excessive fatigue and foggy thinking. Referred to pulmonology for possible sleep study

## 2019-08-25 ENCOUNTER — Telehealth: Payer: Self-pay | Admitting: *Deleted

## 2019-08-25 NOTE — Telephone Encounter (Signed)
Tried calling patient again, no answer.  Form emailed to patient along with letter.  "Dear Barbara Fuentes,  For your form there was not an A1C done.  We tried to add it on to your labs that were drawn and were unable to.  I am not sure if the A1C is need, you can call and ask.   If it is needed we will just need to schedule you for a lab only appointment to come in to get it drawn and I can add it on your form and email it to you.  Please call the office to schedule lab only appointment if that A1C is truly need or if you have any questions or concerns.  Guerry Bruin, CMA"

## 2019-08-25 NOTE — Telephone Encounter (Signed)
Left message on machine to call back to discuss form.

## 2019-08-30 ENCOUNTER — Ambulatory Visit (INDEPENDENT_AMBULATORY_CARE_PROVIDER_SITE_OTHER): Payer: BLUE CROSS/BLUE SHIELD

## 2019-08-30 DIAGNOSIS — J309 Allergic rhinitis, unspecified: Secondary | ICD-10-CM

## 2019-09-12 ENCOUNTER — Ambulatory Visit (INDEPENDENT_AMBULATORY_CARE_PROVIDER_SITE_OTHER): Payer: BLUE CROSS/BLUE SHIELD

## 2019-09-12 DIAGNOSIS — J309 Allergic rhinitis, unspecified: Secondary | ICD-10-CM

## 2019-09-18 ENCOUNTER — Ambulatory Visit (INDEPENDENT_AMBULATORY_CARE_PROVIDER_SITE_OTHER): Payer: BLUE CROSS/BLUE SHIELD

## 2019-09-18 DIAGNOSIS — J309 Allergic rhinitis, unspecified: Secondary | ICD-10-CM

## 2019-09-26 ENCOUNTER — Ambulatory Visit (INDEPENDENT_AMBULATORY_CARE_PROVIDER_SITE_OTHER): Payer: BLUE CROSS/BLUE SHIELD

## 2019-09-26 DIAGNOSIS — J309 Allergic rhinitis, unspecified: Secondary | ICD-10-CM | POA: Diagnosis not present

## 2019-10-03 ENCOUNTER — Ambulatory Visit (INDEPENDENT_AMBULATORY_CARE_PROVIDER_SITE_OTHER): Payer: BLUE CROSS/BLUE SHIELD

## 2019-10-03 DIAGNOSIS — J309 Allergic rhinitis, unspecified: Secondary | ICD-10-CM | POA: Diagnosis not present

## 2019-10-04 ENCOUNTER — Institutional Professional Consult (permissible substitution): Payer: PRIVATE HEALTH INSURANCE | Admitting: Pulmonary Disease

## 2019-10-12 ENCOUNTER — Ambulatory Visit (INDEPENDENT_AMBULATORY_CARE_PROVIDER_SITE_OTHER): Payer: BLUE CROSS/BLUE SHIELD

## 2019-10-12 DIAGNOSIS — J309 Allergic rhinitis, unspecified: Secondary | ICD-10-CM

## 2019-10-24 ENCOUNTER — Ambulatory Visit (INDEPENDENT_AMBULATORY_CARE_PROVIDER_SITE_OTHER): Payer: BLUE CROSS/BLUE SHIELD

## 2019-10-24 DIAGNOSIS — J309 Allergic rhinitis, unspecified: Secondary | ICD-10-CM | POA: Diagnosis not present

## 2019-11-06 NOTE — Progress Notes (Signed)
Vials exp 11-05-20 

## 2019-11-07 ENCOUNTER — Ambulatory Visit (INDEPENDENT_AMBULATORY_CARE_PROVIDER_SITE_OTHER): Payer: BLUE CROSS/BLUE SHIELD

## 2019-11-07 DIAGNOSIS — J309 Allergic rhinitis, unspecified: Secondary | ICD-10-CM

## 2019-11-09 DIAGNOSIS — J3089 Other allergic rhinitis: Secondary | ICD-10-CM

## 2019-11-21 ENCOUNTER — Ambulatory Visit (INDEPENDENT_AMBULATORY_CARE_PROVIDER_SITE_OTHER): Payer: BLUE CROSS/BLUE SHIELD

## 2019-11-21 DIAGNOSIS — J309 Allergic rhinitis, unspecified: Secondary | ICD-10-CM

## 2019-12-04 ENCOUNTER — Other Ambulatory Visit: Payer: Self-pay | Admitting: Obstetrics & Gynecology

## 2019-12-04 DIAGNOSIS — Z9889 Other specified postprocedural states: Secondary | ICD-10-CM

## 2019-12-06 ENCOUNTER — Ambulatory Visit (INDEPENDENT_AMBULATORY_CARE_PROVIDER_SITE_OTHER): Payer: BLUE CROSS/BLUE SHIELD | Admitting: *Deleted

## 2019-12-06 DIAGNOSIS — J309 Allergic rhinitis, unspecified: Secondary | ICD-10-CM

## 2019-12-19 ENCOUNTER — Ambulatory Visit (INDEPENDENT_AMBULATORY_CARE_PROVIDER_SITE_OTHER): Payer: BLUE CROSS/BLUE SHIELD

## 2019-12-19 DIAGNOSIS — J309 Allergic rhinitis, unspecified: Secondary | ICD-10-CM | POA: Diagnosis not present

## 2019-12-27 ENCOUNTER — Other Ambulatory Visit: Payer: Self-pay

## 2019-12-27 ENCOUNTER — Ambulatory Visit
Admission: RE | Admit: 2019-12-27 | Discharge: 2019-12-27 | Disposition: A | Discharge status: Home / Self Care | Payer: PRIVATE HEALTH INSURANCE | Source: Ambulatory Visit | Attending: Obstetrics & Gynecology | Admitting: Obstetrics & Gynecology

## 2019-12-27 DIAGNOSIS — Z9889 Other specified postprocedural states: Secondary | ICD-10-CM

## 2020-01-03 ENCOUNTER — Ambulatory Visit (INDEPENDENT_AMBULATORY_CARE_PROVIDER_SITE_OTHER): Payer: BLUE CROSS/BLUE SHIELD

## 2020-01-03 DIAGNOSIS — J309 Allergic rhinitis, unspecified: Secondary | ICD-10-CM

## 2020-01-16 ENCOUNTER — Ambulatory Visit (INDEPENDENT_AMBULATORY_CARE_PROVIDER_SITE_OTHER): Payer: BLUE CROSS/BLUE SHIELD

## 2020-01-16 DIAGNOSIS — J309 Allergic rhinitis, unspecified: Secondary | ICD-10-CM | POA: Diagnosis not present

## 2020-01-24 ENCOUNTER — Ambulatory Visit (INDEPENDENT_AMBULATORY_CARE_PROVIDER_SITE_OTHER): Payer: BLUE CROSS/BLUE SHIELD

## 2020-01-24 DIAGNOSIS — J309 Allergic rhinitis, unspecified: Secondary | ICD-10-CM

## 2020-01-30 ENCOUNTER — Ambulatory Visit (INDEPENDENT_AMBULATORY_CARE_PROVIDER_SITE_OTHER): Payer: BLUE CROSS/BLUE SHIELD

## 2020-01-30 DIAGNOSIS — J309 Allergic rhinitis, unspecified: Secondary | ICD-10-CM | POA: Diagnosis not present

## 2020-02-06 ENCOUNTER — Ambulatory Visit (INDEPENDENT_AMBULATORY_CARE_PROVIDER_SITE_OTHER): Payer: BLUE CROSS/BLUE SHIELD

## 2020-02-06 DIAGNOSIS — J309 Allergic rhinitis, unspecified: Secondary | ICD-10-CM

## 2020-02-19 ENCOUNTER — Ambulatory Visit (INDEPENDENT_AMBULATORY_CARE_PROVIDER_SITE_OTHER): Payer: BLUE CROSS/BLUE SHIELD

## 2020-02-19 DIAGNOSIS — J309 Allergic rhinitis, unspecified: Secondary | ICD-10-CM | POA: Diagnosis not present

## 2020-03-06 ENCOUNTER — Ambulatory Visit (INDEPENDENT_AMBULATORY_CARE_PROVIDER_SITE_OTHER): Payer: BLUE CROSS/BLUE SHIELD

## 2020-03-06 DIAGNOSIS — J309 Allergic rhinitis, unspecified: Secondary | ICD-10-CM

## 2020-03-25 ENCOUNTER — Ambulatory Visit (INDEPENDENT_AMBULATORY_CARE_PROVIDER_SITE_OTHER): Payer: BLUE CROSS/BLUE SHIELD

## 2020-03-25 DIAGNOSIS — J309 Allergic rhinitis, unspecified: Secondary | ICD-10-CM | POA: Diagnosis not present

## 2020-04-03 DIAGNOSIS — J301 Allergic rhinitis due to pollen: Secondary | ICD-10-CM | POA: Diagnosis not present

## 2020-04-03 NOTE — Progress Notes (Signed)
Vials exp 04-03-21

## 2020-04-09 ENCOUNTER — Ambulatory Visit (INDEPENDENT_AMBULATORY_CARE_PROVIDER_SITE_OTHER): Payer: BLUE CROSS/BLUE SHIELD

## 2020-04-09 DIAGNOSIS — J309 Allergic rhinitis, unspecified: Secondary | ICD-10-CM | POA: Diagnosis not present

## 2020-04-24 ENCOUNTER — Ambulatory Visit (INDEPENDENT_AMBULATORY_CARE_PROVIDER_SITE_OTHER): Payer: BLUE CROSS/BLUE SHIELD

## 2020-04-24 DIAGNOSIS — J309 Allergic rhinitis, unspecified: Secondary | ICD-10-CM

## 2020-05-07 ENCOUNTER — Ambulatory Visit (INDEPENDENT_AMBULATORY_CARE_PROVIDER_SITE_OTHER): Payer: BLUE CROSS/BLUE SHIELD

## 2020-05-07 DIAGNOSIS — J309 Allergic rhinitis, unspecified: Secondary | ICD-10-CM

## 2020-05-20 ENCOUNTER — Ambulatory Visit (INDEPENDENT_AMBULATORY_CARE_PROVIDER_SITE_OTHER): Payer: BLUE CROSS/BLUE SHIELD

## 2020-05-20 DIAGNOSIS — J309 Allergic rhinitis, unspecified: Secondary | ICD-10-CM

## 2020-06-05 ENCOUNTER — Ambulatory Visit (INDEPENDENT_AMBULATORY_CARE_PROVIDER_SITE_OTHER): Payer: BLUE CROSS/BLUE SHIELD

## 2020-06-05 DIAGNOSIS — J309 Allergic rhinitis, unspecified: Secondary | ICD-10-CM

## 2020-06-13 ENCOUNTER — Ambulatory Visit (INDEPENDENT_AMBULATORY_CARE_PROVIDER_SITE_OTHER): Payer: BLUE CROSS/BLUE SHIELD

## 2020-06-13 DIAGNOSIS — J309 Allergic rhinitis, unspecified: Secondary | ICD-10-CM

## 2020-06-19 ENCOUNTER — Ambulatory Visit (INDEPENDENT_AMBULATORY_CARE_PROVIDER_SITE_OTHER): Payer: BLUE CROSS/BLUE SHIELD

## 2020-06-19 DIAGNOSIS — J309 Allergic rhinitis, unspecified: Secondary | ICD-10-CM

## 2020-06-27 ENCOUNTER — Ambulatory Visit (INDEPENDENT_AMBULATORY_CARE_PROVIDER_SITE_OTHER): Payer: BLUE CROSS/BLUE SHIELD

## 2020-06-27 DIAGNOSIS — J309 Allergic rhinitis, unspecified: Secondary | ICD-10-CM | POA: Diagnosis not present

## 2020-07-15 ENCOUNTER — Ambulatory Visit (INDEPENDENT_AMBULATORY_CARE_PROVIDER_SITE_OTHER): Payer: BLUE CROSS/BLUE SHIELD

## 2020-07-15 DIAGNOSIS — J309 Allergic rhinitis, unspecified: Secondary | ICD-10-CM

## 2020-07-29 ENCOUNTER — Ambulatory Visit (INDEPENDENT_AMBULATORY_CARE_PROVIDER_SITE_OTHER): Payer: BLUE CROSS/BLUE SHIELD

## 2020-07-29 DIAGNOSIS — J309 Allergic rhinitis, unspecified: Secondary | ICD-10-CM | POA: Diagnosis not present

## 2020-08-14 ENCOUNTER — Ambulatory Visit (INDEPENDENT_AMBULATORY_CARE_PROVIDER_SITE_OTHER): Payer: BLUE CROSS/BLUE SHIELD

## 2020-08-14 DIAGNOSIS — J309 Allergic rhinitis, unspecified: Secondary | ICD-10-CM

## 2020-08-21 ENCOUNTER — Other Ambulatory Visit: Payer: Self-pay

## 2020-08-22 ENCOUNTER — Ambulatory Visit (INDEPENDENT_AMBULATORY_CARE_PROVIDER_SITE_OTHER): Payer: BLUE CROSS/BLUE SHIELD | Admitting: Family Medicine

## 2020-08-22 ENCOUNTER — Other Ambulatory Visit: Payer: Self-pay

## 2020-08-22 ENCOUNTER — Encounter: Payer: Self-pay | Admitting: Family Medicine

## 2020-08-22 VITALS — BP 108/62 | HR 62 | Temp 97.8°F | Resp 16 | Ht 66.0 in | Wt 157.6 lb

## 2020-08-22 DIAGNOSIS — E785 Hyperlipidemia, unspecified: Secondary | ICD-10-CM

## 2020-08-22 DIAGNOSIS — R739 Hyperglycemia, unspecified: Secondary | ICD-10-CM | POA: Diagnosis not present

## 2020-08-22 DIAGNOSIS — Z0000 Encounter for general adult medical examination without abnormal findings: Secondary | ICD-10-CM | POA: Diagnosis not present

## 2020-08-22 DIAGNOSIS — Z86010 Personal history of colonic polyps: Secondary | ICD-10-CM

## 2020-08-22 DIAGNOSIS — Z1159 Encounter for screening for other viral diseases: Secondary | ICD-10-CM | POA: Diagnosis not present

## 2020-08-22 LAB — COMPREHENSIVE METABOLIC PANEL
ALT: 10 U/L (ref 0–35)
AST: 13 U/L (ref 0–37)
Albumin: 4 g/dL (ref 3.5–5.2)
Alkaline Phosphatase: 38 U/L — ABNORMAL LOW (ref 39–117)
BUN: 9 mg/dL (ref 6–23)
CO2: 28 mEq/L (ref 19–32)
Calcium: 8.9 mg/dL (ref 8.4–10.5)
Chloride: 104 mEq/L (ref 96–112)
Creatinine, Ser: 0.62 mg/dL (ref 0.40–1.20)
GFR: 100 mL/min (ref >60.00)
Glucose, Bld: 85 mg/dL (ref 70–99)
Potassium: 4.7 mEq/L (ref 3.5–5.1)
Sodium: 137 mEq/L (ref 135–145)
Total Bilirubin: 0.4 mg/dL (ref 0.2–1.2)
Total Protein: 6.3 g/dL (ref 6.0–8.3)

## 2020-08-22 LAB — CBC WITH DIFFERENTIAL/PLATELET
Basophils Absolute: 0.1 10*3/uL (ref 0.0–0.1)
Basophils Relative: 1.2 % (ref 0.0–3.0)
Eosinophils Absolute: 0.5 10*3/uL (ref 0.0–0.7)
Eosinophils Relative: 5.8 % — ABNORMAL HIGH (ref 0.0–5.0)
HCT: 37.4 % (ref 36.0–46.0)
Hemoglobin: 12.8 g/dL (ref 12.0–15.0)
Lymphocytes Relative: 28.5 % (ref 12.0–46.0)
Lymphs Abs: 2.2 10*3/uL (ref 0.7–4.0)
MCHC: 34.1 g/dL (ref 30.0–36.0)
MCV: 89.9 fl (ref 78.0–100.0)
Monocytes Absolute: 0.6 10*3/uL (ref 0.1–1.0)
Monocytes Relative: 7.6 % (ref 3.0–12.0)
Neutro Abs: 4.4 10*3/uL (ref 1.4–7.7)
Neutrophils Relative %: 56.9 % (ref 43.0–77.0)
Platelets: 313 10*3/uL (ref 150.0–400.0)
RBC: 4.16 Mil/uL (ref 3.87–5.11)
RDW: 14.4 % (ref 11.5–15.5)
WBC: 7.8 10*3/uL (ref 4.0–10.5)

## 2020-08-22 LAB — HEMOGLOBIN A1C: Hgb A1c MFr Bld: 5.5 % (ref 4.6–6.5)

## 2020-08-22 LAB — TSH: TSH: 3.2 u[IU]/mL (ref 0.35–4.50)

## 2020-08-22 LAB — LIPID PANEL
Cholesterol: 170 mg/dL (ref 0–200)
HDL: 74.5 mg/dL (ref >39.00)
LDL Cholesterol: 81 mg/dL (ref 0–99)
NonHDL: 95.46
Total CHOL/HDL Ratio: 2
Triglycerides: 73 mg/dL (ref 0.0–149.0)
VLDL: 14.6 mg/dL (ref 0.0–40.0)

## 2020-08-22 NOTE — Patient Instructions (Signed)
[facility]  Preventive Care 56-56 Years Old, Female Preventive care refers to lifestyle choices and visits with your health care provider that can promote health and wellness. This includes:  A yearly physical exam. This is also called an annual wellness visit.  Regular dental and eye exams.  Immunizations.  Screening for certain conditions.  Healthy lifestyle choices, such as: ? Eating a healthy diet. ? Getting regular exercise. ? Not using drugs or products that contain nicotine and tobacco. ? Limiting alcohol use. What can I expect for my preventive care visit? Physical exam Your health care provider will check your:  Height and weight. These may be used to calculate your BMI (body mass index). BMI is a measurement that tells if you are at a healthy weight.  Heart rate and blood pressure.  Body temperature.  Skin for abnormal spots. Counseling Your health care provider may ask you questions about your:  Past medical problems.  Family's medical history.  Alcohol, tobacco, and drug use.  Emotional well-being.  Home life and relationship well-being.  Sexual activity.  Diet, exercise, and sleep habits.  Work and work Statistician.  Access to firearms.  Method of birth control.  Menstrual cycle.  Pregnancy history. What immunizations do I need? Vaccines are usually given at various ages, according to a schedule. Your health care provider will recommend vaccines for you based on your age, medical history, and lifestyle or other factors, such as travel or where you work.   What tests do I need? Blood tests  Lipid and cholesterol levels. These may be checked every 5 years, or more often if you are over 35 years old.  Hepatitis C test.  Hepatitis B test. Screening  Lung cancer screening. You may have this screening every year starting at age 67 if you have a 30-pack-year history of smoking and currently smoke or have quit within the  past 15 years.  Colorectal cancer screening. ? All adults should have this screening starting at age 72 and continuing until age 60. ? Your health care provider may recommend screening at age 78 if you are at increased risk. ? You will have tests every 1-10 years, depending on your results and the type of screening test.  Diabetes screening. ? This is done by checking your blood sugar (glucose) after you have not eaten for a while (fasting). ? You may have this done every 1-3 years.  Mammogram. ? This may be done every 1-2 years. ? Talk with your health care provider about when you should start having regular mammograms. This may depend on whether you have a family history of breast cancer.  BRCA-related cancer screening. This may be done if you have a family history of breast, ovarian, tubal, or peritoneal cancers.  Pelvic exam and Pap test. ? This may be done every 3 years starting at age 49. ? Starting at age 60, this may be done every 5 years if you have a Pap test in combination with an HPV test. Other tests  STD (sexually transmitted disease) testing, if you are at risk.  Bone density scan. This is done to screen for osteoporosis. You may have this scan if you are at high risk for osteoporosis. Talk with your health care provider about your test results, treatment options, and if necessary, the need for more tests. Follow these instructions at home: Eating and drinking  Eat a diet that includes fresh fruits and vegetables, whole grains, lean protein, and low-fat dairy products.  Take vitamin and mineral supplements as recommended by your health care provider.  Do not drink alcohol if: ? Your health care provider tells you not to drink. ? You are pregnant, may be pregnant, or are planning to become pregnant.  If you drink alcohol: ? Limit how much you have to 0-1 drink a day. ? Be aware of how much alcohol is in your drink. In the [place] one drink equals one 12 oz bottle of  beer (355 mL), one 5 oz glass of wine (148 mL), or one 1 oz glass of hard liquor (44 mL).   Lifestyle  Take daily care of your teeth and gums. Brush your teeth every morning and night with fluoride toothpaste. Floss one time each day.  Stay active. Exercise for at least 30 minutes 5 or more days each week.  Do not use any products that contain nicotine or tobacco, such as cigarettes, e-cigarettes, and chewing tobacco. If you need help quitting, ask your health care provider.  Do not use drugs.  If you are sexually active, practice safe sex. Use a condom or other form of protection to prevent STIs (sexually transmitted infections).  If you do not wish to become pregnant, use a form of birth control. If you plan to become pregnant, see your health care provider for a prepregnancy visit.  If told by your health care provider, take low-dose aspirin daily starting at age 13.  Find healthy ways to cope with stress, such as: ? Meditation, yoga, or listening to music. ? Journaling. ? Talking to a trusted person. ? Spending time with friends and family. Safety  Always wear your seat belt while driving or riding in a vehicle.  Do not drive: ? If you have been drinking alcohol. Do not ride with someone who has been drinking. ? When you are tired or distracted. ? While texting.  Wear a helmet and other protective equipment during sports activities.  If you have firearms in your house, make sure you follow all gun safety procedures. What's next?  Visit your health care provider once a year for an annual wellness visit.  Ask your health care provider how often you should have your eyes and teeth checked.  Stay up to date on all vaccines. This information is not intended to replace advice given to you by your health care provider. Make sure you discuss any questions you have with your health care provider. Document Revised: 01/16/2020 Document Reviewed: 12/23/2017 Elsevier Patient  Education  2021 [employer].

## 2020-08-22 NOTE — Assessment & Plan Note (Addendum)
Patient encouraged to maintain heart healthy diet, regular exercise, adequate sleep. Consider daily probiotics. Take medications as prescribed. Is due for colonoscopy but has been caring for her fatther who died of CHF on July 31, 2020. She is ready to do it now referral placed.MGM due in September 2022. Pap due in the fall, sees Dr Sabra Heck she will call to get an appt. Hep C screen today. Follows with Dr Susie Cassette for dermatology

## 2020-08-23 ENCOUNTER — Encounter: Payer: Self-pay | Admitting: Family Medicine

## 2020-08-23 DIAGNOSIS — Z1211 Encounter for screening for malignant neoplasm of colon: Secondary | ICD-10-CM

## 2020-08-23 LAB — HEPATITIS C ANTIBODY
Hepatitis C Ab: NONREACTIVE
SIGNAL TO CUT-OFF: 0 (ref <1.00)

## 2020-08-24 DIAGNOSIS — R739 Hyperglycemia, unspecified: Secondary | ICD-10-CM | POA: Insufficient documentation

## 2020-08-24 DIAGNOSIS — E785 Hyperlipidemia, unspecified: Secondary | ICD-10-CM | POA: Insufficient documentation

## 2020-08-24 DIAGNOSIS — Z86010 Personal history of colonic polyps: Secondary | ICD-10-CM | POA: Insufficient documentation

## 2020-08-24 NOTE — Assessment & Plan Note (Signed)
Very mild elevation of ldl cholesterol

## 2020-08-24 NOTE — Assessment & Plan Note (Addendum)
Persistent, reviewed chart back to 2014 and numbers are up from 20s to 30s, stable essentially and asymptomatic. Normal for patient.

## 2020-08-24 NOTE — Assessment & Plan Note (Signed)
Asymptomatic, referred for colonoscopy

## 2020-08-24 NOTE — Assessment & Plan Note (Signed)
hgba1c acceptable, minimize simple carbs. Increase exercise as tolerated.  

## 2020-08-24 NOTE — Progress Notes (Signed)
Subjective:    Patient ID: Barbara Fuentes, female    DOB: 1965/12/12, 55 y.o.   MRN: [id number]  Chief Complaint  Patient presents with  . Annual Exam    Pt has no concerns or problems    HPI Patient is in today for annual preventative exam. She feels well today. No recent febrile illness or hospitalizations. No polyuria or polydipsia noted. She has lost her father this year after caring for him and she struggles with some sadness but is managing OK most days. She also lost her Paternal Aunt to Ovarian Cancer. She is trying to eat well and maintain a heart healthy diet. No acute concerns otherwise. Denies CP/palp/SOB/HA/congestion/fevers/GI or GU c/o. Taking meds as prescribed  Past Medical History:  Diagnosis Date  . Abnormal Pap smear of cervix    before children  . Alkaline phosphatase deficiency    29 in 9/11  . Allergic state 08/23/2015  . Arthritis    hip  . Breast cancer of lower-outer quadrant of right female breast ([place]) 12/20/2015   right breast  . Eustachian tube dysfunction 02/2014  . H/O recurrent pneumonia 08/23/2015  . History of chicken pox 08/23/2015  . History of radiation therapy 03/05/16- 04/02/16   Right Breast treated to 40.05 Gy in 15 fractions & Right Breast boost to 10 Gy in 5 fractions.   . Low BP   . Personal history of radiation therapy    2017 right breast  . Pneumonia   . RAD (reactive airway disease)     FORMER smoker , RAD with RTI's, cat or allergen exposure  . Sacral pain 2014   trauma ( fall)    Past Surgical History:  Procedure Laterality Date  . BREAST BIOPSY Right 12/19/2015  . BREAST LUMPECTOMY Right 01/20/2016   Procedure: RIGHT BREAST LUMPECTOMY;  Surgeon: [name], MD;  Location: [facility];  Service: General;  Laterality: Right;  . BREAST LUMPECTOMY WITH RADIOACTIVE SEED LOCALIZATION Right 01/10/2016   Procedure: BREAST LUMPECTOMY WITH RADIOACTIVE SEED LOCALIZATION;  Surgeon: [name], MD;  Location: [facility];   Service: General;  Laterality: Right;  . CERVICAL FUSION  07/30/2010   & Discectomy, Dr Vertell Limber ( @ 3 levels)/has plate and 6 screws  . SEPTOPLASTY  1983   for septal deviation  . TONSILLECTOMY    . uterine ablation  2008   Dr Ronita Hipps  . WISDOM TOOTH EXTRACTION      Family History  Problem Relation Age of Onset  . Diabetes Father        Type II, AO  . Alcohol abuse Father   . Depression Father   . Prostate cancer Father 7  . Lung cancer Father 37  . Heart disease Father        chf  . Depression Brother   . HIV Brother        AIDS  . Heart attack Paternal Grandfather 69  . Heart disease Paternal Grandfather   . Alcohol abuse Paternal Grandfather        smoker  . Colon cancer Maternal Grandfather        dx late 63s; w/ colostomy  . COPD Paternal Grandmother   . Alcohol abuse Paternal Grandmother        smoker  . Emphysema Paternal Grandmother   . Bronchitis Paternal Grandmother   . Heart disease Mother        cardiac rest, epinephrine with Novocaine  . Arthritis Mother   . Other Maternal  Grandmother        issues w/ breast in her 90s--may have been breast cancer  . Cancer Paternal Aunt        probable ovarian  . Heart disease Sister   . Stroke Neg Hx   . Asthma Neg Hx   . Allergic rhinitis Neg Hx   . Angioedema Neg Hx   . Eczema Neg Hx   . Immunodeficiency Neg Hx   . Urticaria Neg Hx     Social History   Socioeconomic History  . Marital status: Married    Spouse name: Not on file  . Number of children: Not on file  . Years of education: Not on file  . Highest education level: Not on file  Occupational History  . Occupation: Chemical engineer   Tobacco Use  . Smoking status: Former Smoker    Years: 28.00    Types: Cigarettes    Quit date: 06/18/2010    Years since quitting: 10.1  . Smokeless tobacco: Never Used  . Tobacco comment: smoked age [age] up to 1 ppd  Vaping Use  . Vaping Use: Never used  Substance and Sexual Activity  . Alcohol use: Yes  .  Drug use: No  . Sexual activity: Yes    Birth control/protection: Post-menopausal  Other Topics Concern  . Not on file  Social History Narrative  . Not on file   Social Determinants of Health   Financial Resource Strain: Not on file  Food Insecurity: Not on file  Transportation Needs: Not on file  Physical Activity: Not on file  Stress: Not on file  Social Connections: Not on file  Intimate Partner Violence: Not on file    Outpatient Medications Prior to Visit  Medication Sig Dispense Refill  . albuterol (PROAIR HFA) 108 (90 Base) MCG/ACT inhaler Inhale 2 puffs into the lungs every 4 (four) hours as needed for wheezing or shortness of breath. 18 g 1  . EPINEPHrine (EPIPEN 2-PAK) 0.3 mg/0.3 mL IJ SOAJ injection Use as directed for severe allergic reactions 2 each 2  . fexofenadine-pseudoephedrine (ALLEGRA-D 24) 180-240 MG 24 hr tablet Take 1 tablet by mouth daily.    . fluticasone (FLONASE) 50 MCG/ACT nasal spray Place 1-2 sprays into both nostrils daily as needed (for stuffy nose). 16 g 5  . Probiotic Product ([place]) Take by mouth. Take one pill daily    . UNABLE TO FIND Take 2 capsules by mouth daily. Med Name: Essential for Women Multivitamin     No facility-administered medications prior to visit.    Allergies  Allergen Reactions  . Codeine     REACTION: nausea & vomiting  . Procaine Hcl     REACTION: tachycardia in family members!    Review of Systems  Constitutional: Negative for chills, fever and malaise/fatigue.  HENT: Negative for congestion and hearing loss.   Eyes: Negative for discharge.  Respiratory: Negative for cough, sputum production and shortness of breath.   Cardiovascular: Negative for chest pain, palpitations and leg swelling.  Gastrointestinal: Negative for abdominal pain, blood in stool, constipation, diarrhea, heartburn, nausea and vomiting.  Genitourinary: Negative for dysuria, frequency, hematuria and urgency.   Musculoskeletal: Negative for back pain, falls and myalgias.  Skin: Negative for rash.  Neurological: Negative for dizziness, sensory change, loss of consciousness, weakness and headaches.  Endo/Heme/Allergies: Negative for environmental allergies. Does not bruise/bleed easily.  Psychiatric/Behavioral: Negative for depression and suicidal ideas. The patient is not nervous/anxious and does not have insomnia.  Objective:    Physical Exam Constitutional:      General: She is not in acute distress.    Appearance: She is not diaphoretic.  HENT:     Head: Normocephalic and atraumatic.     Right Ear: External ear normal.     Left Ear: External ear normal.     Nose: Nose normal.     Mouth/Throat:     Pharynx: No oropharyngeal exudate.  Eyes:     General: No scleral icterus.       Right eye: No discharge.        Left eye: No discharge.     Conjunctiva/sclera: Conjunctivae normal.     Pupils: Pupils are equal, round, and reactive to light.  Neck:     Thyroid: No thyromegaly.  Cardiovascular:     Rate and Rhythm: Normal rate and regular rhythm.     Heart sounds: Normal heart sounds. No murmur heard.   Pulmonary:     Effort: Pulmonary effort is normal. No respiratory distress.     Breath sounds: Normal breath sounds. No wheezing or rales.  Abdominal:     General: Bowel sounds are normal. There is no distension.     Palpations: Abdomen is soft. There is no mass.     Tenderness: There is no abdominal tenderness.  Musculoskeletal:        General: No tenderness. Normal range of motion.     Cervical back: Normal range of motion and neck supple.  Lymphadenopathy:     Cervical: No cervical adenopathy.  Skin:    General: Skin is warm and dry.     Findings: No rash.  Neurological:     Mental Status: She is alert and oriented to person, place, and time.     Cranial Nerves: No cranial nerve deficit.     Coordination: Coordination normal.     Deep Tendon Reflexes: Reflexes are  normal and symmetric. Reflexes normal.     BP 108/62   Pulse 62   Temp 97.8 F (36.6 C)   Resp 16   Ht 5' 6"  (1.676 m)   Wt 157 lb 9.6 oz (71.5 kg)   LMP 04/25/2015 Comment: Uterine ablations  SpO2 99%   BMI 25.44 kg/m  Wt Readings from Last 3 Encounters:  08/22/20 157 lb 9.6 oz (71.5 kg)  08/22/19 155 lb (70.3 kg)  04/25/19 156 lb (70.8 kg)    Diabetic Foot Exam - Simple   No data filed    Lab Results  Component Value Date   WBC 7.8 08/22/2020   HGB 12.8 08/22/2020   HCT 37.4 08/22/2020   PLT 313.0 08/22/2020   GLUCOSE 85 08/22/2020   CHOL 170 08/22/2020   TRIG 73.0 08/22/2020   HDL 74.50 08/22/2020   LDLCALC 81 08/22/2020   ALT 10 08/22/2020   AST 13 08/22/2020   NA 137 08/22/2020   K 4.7 08/22/2020   CL 104 08/22/2020   CREATININE 0.62 08/22/2020   BUN 9 08/22/2020   CO2 28 08/22/2020   TSH 3.20 08/22/2020   HGBA1C 5.5 08/22/2020    Lab Results  Component Value Date   TSH 3.20 08/22/2020   Lab Results  Component Value Date   WBC 7.8 08/22/2020   HGB 12.8 08/22/2020   HCT 37.4 08/22/2020   MCV 89.9 08/22/2020   PLT 313.0 08/22/2020   Lab Results  Component Value Date   NA 137 08/22/2020   K 4.7 08/22/2020   CHLORIDE 106 12/25/2015  CO2 28 08/22/2020   GLUCOSE 85 08/22/2020   BUN 9 08/22/2020   CREATININE 0.62 08/22/2020   BILITOT 0.4 08/22/2020   ALKPHOS 38 (L) 08/22/2020   AST 13 08/22/2020   ALT 10 08/22/2020   PROT 6.3 08/22/2020   ALBUMIN 4.0 08/22/2020   CALCIUM 8.9 08/22/2020   ANIONGAP 10 12/25/2015   EGFR >90 12/25/2015   GFR 100.00 08/22/2020   Lab Results  Component Value Date   CHOL 170 08/22/2020   Lab Results  Component Value Date   HDL 74.50 08/22/2020   Lab Results  Component Value Date   LDLCALC 81 08/22/2020   Lab Results  Component Value Date   TRIG 73.0 08/22/2020   Lab Results  Component Value Date   CHOLHDL 2 08/22/2020   Lab Results  Component Value Date   HGBA1C 5.5 08/22/2020        Assessment & Plan:   Problem List Items Addressed This Visit    Alkaline phosphatase deficiency    Persistent, reviewed chart back to 2014 and numbers are up from 20s to 71s, stable essentially and asymptomatic. Normal for patient.      Preventative health care - Primary    Patient encouraged to maintain heart healthy diet, regular exercise, adequate sleep. Consider daily probiotics. Take medications as prescribed. Is due for colonoscopy but has been caring for her fatther who died of CHF on 08-09-2020. She is ready to do it now referral placed.MGM due in September 2022. Pap due in the fall, sees Dr Sabra Heck she will call to get an appt. Hep C screen today. Follows with Dr Susie Cassette for dermatology      Relevant Orders   CBC with Differential/Platelet (Completed)   Comprehensive metabolic panel (Completed)   Lipid panel (Completed)   TSH (Completed)   H/O adenomatous polyp of colon    Asymptomatic, referred for colonoscopy      Relevant Orders   Ambulatory referral to Gastroenterology   Hyperglycemia    hgba1c acceptable, minimize simple carbs. Increase exercise as tolerated.       Relevant Orders   Hemoglobin A1c (Completed)   RESOLVED: Hyperlipidemia    Very mild elevation of ldl cholesterol       Other Visit Diagnoses    Encounter for hepatitis C screening test for low risk patient       Relevant Orders   Hepatitis C Antibody (Completed)      I am having Barbara Fuentes maintain her fexofenadine-pseudoephedrine, Probiotic Product ([place]), EPINEPHrine, albuterol, fluticasone, and UNABLE TO FIND.  No orders of the defined types were placed in this encounter.    [name], MD

## 2020-08-26 ENCOUNTER — Other Ambulatory Visit: Payer: Self-pay | Admitting: Family

## 2020-08-26 ENCOUNTER — Ambulatory Visit (INDEPENDENT_AMBULATORY_CARE_PROVIDER_SITE_OTHER): Payer: BLUE CROSS/BLUE SHIELD

## 2020-08-26 DIAGNOSIS — J309 Allergic rhinitis, unspecified: Secondary | ICD-10-CM

## 2020-08-27 ENCOUNTER — Other Ambulatory Visit: Payer: Self-pay | Admitting: Family Medicine

## 2020-08-27 DIAGNOSIS — Z87891 Personal history of nicotine dependence: Secondary | ICD-10-CM

## 2020-09-03 ENCOUNTER — Other Ambulatory Visit: Payer: Self-pay | Admitting: *Deleted

## 2020-09-03 DIAGNOSIS — Z87891 Personal history of nicotine dependence: Secondary | ICD-10-CM

## 2020-09-06 ENCOUNTER — Encounter: Payer: Self-pay | Admitting: Gastroenterology

## 2020-09-11 ENCOUNTER — Ambulatory Visit (INDEPENDENT_AMBULATORY_CARE_PROVIDER_SITE_OTHER): Payer: BLUE CROSS/BLUE SHIELD

## 2020-09-11 DIAGNOSIS — J309 Allergic rhinitis, unspecified: Secondary | ICD-10-CM

## 2020-09-24 ENCOUNTER — Ambulatory Visit (INDEPENDENT_AMBULATORY_CARE_PROVIDER_SITE_OTHER): Payer: BLUE CROSS/BLUE SHIELD

## 2020-09-24 DIAGNOSIS — J309 Allergic rhinitis, unspecified: Secondary | ICD-10-CM | POA: Diagnosis not present

## 2020-09-25 DIAGNOSIS — J301 Allergic rhinitis due to pollen: Secondary | ICD-10-CM | POA: Diagnosis not present

## 2020-09-25 NOTE — Progress Notes (Signed)
VIALS MADE & EXP 6-1- 23

## 2020-09-30 ENCOUNTER — Telehealth: Payer: Self-pay | Admitting: Family Medicine

## 2020-09-30 NOTE — Telephone Encounter (Signed)
Will call back to schedule.

## 2020-10-01 ENCOUNTER — Telehealth: Payer: Self-pay | Admitting: Acute Care

## 2020-10-02 NOTE — Telephone Encounter (Signed)
Spoke with pt and advised that shared decision visit and CT have been cancelled. Pt will call back sometime in July to reschedule. Nothing further needed at this time.

## 2020-10-07 ENCOUNTER — Ambulatory Visit (INDEPENDENT_AMBULATORY_CARE_PROVIDER_SITE_OTHER): Payer: BLUE CROSS/BLUE SHIELD

## 2020-10-07 DIAGNOSIS — J309 Allergic rhinitis, unspecified: Secondary | ICD-10-CM

## 2020-10-09 ENCOUNTER — Ambulatory Visit (HOSPITAL_BASED_OUTPATIENT_CLINIC_OR_DEPARTMENT_OTHER): Payer: PRIVATE HEALTH INSURANCE

## 2020-10-09 ENCOUNTER — Encounter: Payer: PRIVATE HEALTH INSURANCE | Admitting: Acute Care

## 2020-10-21 ENCOUNTER — Ambulatory Visit (INDEPENDENT_AMBULATORY_CARE_PROVIDER_SITE_OTHER): Payer: BLUE CROSS/BLUE SHIELD

## 2020-10-21 DIAGNOSIS — J309 Allergic rhinitis, unspecified: Secondary | ICD-10-CM

## 2020-10-29 ENCOUNTER — Ambulatory Visit (INDEPENDENT_AMBULATORY_CARE_PROVIDER_SITE_OTHER): Payer: BLUE CROSS/BLUE SHIELD

## 2020-10-29 DIAGNOSIS — J309 Allergic rhinitis, unspecified: Secondary | ICD-10-CM | POA: Diagnosis not present

## 2020-11-06 ENCOUNTER — Ambulatory Visit (AMBULATORY_SURGERY_CENTER): Payer: BLUE CROSS/BLUE SHIELD | Admitting: *Deleted

## 2020-11-06 ENCOUNTER — Other Ambulatory Visit: Payer: Self-pay

## 2020-11-06 VITALS — Ht 66.0 in | Wt 154.0 lb

## 2020-11-06 DIAGNOSIS — Z86010 Personal history of colonic polyps: Secondary | ICD-10-CM

## 2020-11-06 MED ORDER — NA SULFATE-K SULFATE-MG SULF 17.5-3.13-1.6 GM/177ML PO SOLN: ORAL | 0 refills | Status: DC

## 2020-11-06 NOTE — Progress Notes (Signed)

## 2020-11-08 ENCOUNTER — Ambulatory Visit (INDEPENDENT_AMBULATORY_CARE_PROVIDER_SITE_OTHER): Payer: BLUE CROSS/BLUE SHIELD

## 2020-11-08 DIAGNOSIS — J309 Allergic rhinitis, unspecified: Secondary | ICD-10-CM

## 2020-11-18 ENCOUNTER — Ambulatory Visit (INDEPENDENT_AMBULATORY_CARE_PROVIDER_SITE_OTHER): Payer: BLUE CROSS/BLUE SHIELD

## 2020-11-18 DIAGNOSIS — J309 Allergic rhinitis, unspecified: Secondary | ICD-10-CM

## 2020-11-20 ENCOUNTER — Ambulatory Visit (AMBULATORY_SURGERY_CENTER): Payer: BLUE CROSS/BLUE SHIELD | Admitting: Gastroenterology

## 2020-11-20 ENCOUNTER — Encounter: Payer: Self-pay | Admitting: Gastroenterology

## 2020-11-20 ENCOUNTER — Other Ambulatory Visit: Payer: Self-pay

## 2020-11-20 VITALS — BP 117/72 | HR 57 | Temp 98.7°F | Resp 14 | Ht 66.0 in | Wt 154.0 lb

## 2020-11-20 DIAGNOSIS — Z86010 Personal history of colonic polyps: Secondary | ICD-10-CM | POA: Diagnosis present

## 2020-11-20 DIAGNOSIS — K635 Polyp of colon: Secondary | ICD-10-CM | POA: Diagnosis not present

## 2020-11-20 DIAGNOSIS — D120 Benign neoplasm of cecum: Secondary | ICD-10-CM

## 2020-11-20 DIAGNOSIS — D127 Benign neoplasm of rectosigmoid junction: Secondary | ICD-10-CM

## 2020-11-20 MED ORDER — SODIUM CHLORIDE 0.9 % IV SOLN: 500.0000 mL | Freq: Once | INTRAVENOUS | Status: DC

## 2020-11-20 NOTE — Progress Notes (Signed)
Report to PACU, RN, vss, BBS= Clear.  

## 2020-11-20 NOTE — Patient Instructions (Signed)
Please read handouts provided. Continue present medications. Await pathology results.   YOU HAD AN ENDOSCOPIC PROCEDURE TODAY AT THE [facility]:   Refer to the procedure report that was given to you for any specific questions about what was found during the examination.  If the procedure report does not answer your questions, please call your gastroenterologist to clarify.  If you requested that your care partner not be given the details of your procedure findings, then the procedure report has been included in a sealed envelope for you to review at your convenience later.  YOU SHOULD EXPECT: Some feelings of bloating in the abdomen. Passage of more gas than usual.  Walking can help get rid of the air that was put into your GI tract during the procedure and reduce the bloating. If you had a lower endoscopy (such as a colonoscopy or flexible sigmoidoscopy) you may notice spotting of blood in your stool or on the toilet paper. If you underwent a bowel prep for your procedure, you may not have a normal bowel movement for a few days.  Please Note:  You might notice some irritation and congestion in your nose or some drainage.  This is from the oxygen used during your procedure.  There is no need for concern and it should clear up in a day or so.  SYMPTOMS TO REPORT IMMEDIATELY:  Following lower endoscopy (colonoscopy or flexible sigmoidoscopy):  Excessive amounts of blood in the stool  Significant tenderness or worsening of abdominal pains  Swelling of the abdomen that is new, acute  Fever of 100F or higher   For urgent or emergent issues, a gastroenterologist can be reached at any hour by calling [phone]. Do not use MyChart messaging for urgent concerns.    DIET:  We do recommend a small meal at first, but then you may proceed to your regular diet.  Drink plenty of fluids but you should avoid alcoholic beverages for 24 hours.  ACTIVITY:  You should plan to take it easy  for the rest of today and you should NOT DRIVE or use heavy machinery until tomorrow (because of the sedation medicines used during the test).    FOLLOW UP: Our staff will call the number listed on your records 48-72 hours following your procedure to check on you and address any questions or concerns that you may have regarding the information given to you following your procedure. If we do not reach you, we will leave a message.  We will attempt to reach you two times.  During this call, we will ask if you have developed any symptoms of COVID 19. If you develop any symptoms (ie: fever, flu-like symptoms, shortness of breath, cough etc.) before then, please call [phone].  If you test positive for Covid 19 in the 2 weeks post procedure, please call and report this information to us.    If any biopsies were taken you will be contacted by phone or by letter within the next 1-3 weeks.  Please call us at [phone] if you have not heard about the biopsies in 3 weeks.    SIGNATURES/CONFIDENTIALITY: You and/or your care partner have signed paperwork which will be entered into your electronic medical record.  These signatures attest to the fact that that the information above on your After Visit Summary has been reviewed and is understood.  Full responsibility of the confidentiality of this discharge information lies with you and/or your care-partner.  

## 2020-11-20 NOTE — Progress Notes (Signed)
Pt's states no medical or surgical changes since previsit or office visit.   Check-in-AER  V/S-Gravity

## 2020-11-20 NOTE — Op Note (Signed)
[facility] Patient Name: Barbara Fuentes Procedure Date: 11/20/2020 1:32 PM MRN: [id number] Endoscopist: Remo Lipps P. Havery Moros , MD Age: 55 Referring MD:  Date of Birth: 1965-06-24 Gender: Female Account #: [id number] Procedure:                Colonoscopy Indications:              High risk colon cancer surveillance: Personal                            history of colonic polyps (5 polyps removed 06/2017) Medicines:                Monitored Anesthesia Care Procedure:                Pre-Anesthesia Assessment:                           - Prior to the procedure, a History and Physical                            was performed, and patient medications and                            allergies were reviewed. The patient's tolerance of                            previous anesthesia was also reviewed. The risks                            and benefits of the procedure and the sedation                            options and risks were discussed with the patient.                            All questions were answered, and informed consent                            was obtained. Prior Anticoagulants: The patient has                            taken no previous anticoagulant or antiplatelet                            agents. ASA Grade Assessment: II - A patient with                            mild systemic disease. After reviewing the risks                            and benefits, the patient was deemed in                            satisfactory condition to undergo the procedure.  After obtaining informed consent, the colonoscope                            was passed under direct vision. Throughout the                            procedure, the patient's blood pressure, pulse, and                            oxygen saturations were monitored continuously. The                            Olympus [id number]) Colonoscope was                             introduced through the anus and advanced to the the                            cecum, identified by appendiceal orifice and                            ileocecal valve. The colonoscopy was performed                            without difficulty. The patient tolerated the                            procedure well. The quality of the bowel                            preparation was good. The ileocecal valve,                            appendiceal orifice, and rectum were photographed. Scope In: 1:35:20 PM Scope Out: 2:03:56 PM Scope Withdrawal Time: 0 hours 21 minutes 17 seconds  Total Procedure Duration: 0 hours 28 minutes 36 seconds  Findings:                 The perianal and digital rectal examinations were                            normal.                           Two flat polyps were found in the cecum. The polyps                            were 3 mm in size. These polyps were removed with a                            cold snare. Resection and retrieval were complete.                           A 4 mm polyp was found in the hepatic flexure. The  polyp was sessile. The polyp was removed with a                            cold snare. Resection and retrieval were complete.                           A 3 mm polyp was found in the recto-sigmoid colon.                            The polyp was sessile. The polyp was removed with a                            cold snare. Resection and retrieval were complete.                           The colon was extremely tortuous.                           Internal hemorrhoids were found during retroflexion.                           The exam was otherwise without abnormality. Complications:            No immediate complications. Estimated blood loss:                            Minimal. Estimated Blood Loss:     Estimated blood loss was minimal. Impression:               - Two 3 mm polyps in the cecum, removed with a cold                             snare. Resected and retrieved.                           - One 4 mm polyp at the hepatic flexure, removed                            with a cold snare. Resected and retrieved.                           - One 3 mm polyp at the recto-sigmoid colon,                            removed with a cold snare. Resected and retrieved.                           - Tortuous colon.                           - Internal hemorrhoids.                           - The examination was otherwise normal. Recommendation:           -  Patient has a contact number available for                            emergencies. The signs and symptoms of potential                            delayed complications were discussed with the                            patient. Return to normal activities tomorrow.                            Written discharge instructions were provided to the                            patient.                           - Resume previous diet.                           - Continue present medications.                           - Await pathology results. Remo Lipps P. Lafe Clerk, MD 11/20/2020 2:12:55 PM This report has been signed electronically.

## 2020-11-20 NOTE — Progress Notes (Signed)
Called to room to assist during endoscopic procedure.  Patient ID and intended procedure confirmed with present staff. Received instructions for my participation in the procedure from the performing physician.  

## 2020-11-22 ENCOUNTER — Telehealth: Payer: Self-pay

## 2020-11-22 NOTE — Telephone Encounter (Signed)
Left message on follow up call. 

## 2020-11-26 ENCOUNTER — Other Ambulatory Visit: Payer: Self-pay | Admitting: Obstetrics & Gynecology

## 2020-11-26 DIAGNOSIS — Z853 Personal history of malignant neoplasm of breast: Secondary | ICD-10-CM

## 2020-11-26 DIAGNOSIS — Z9889 Other specified postprocedural states: Secondary | ICD-10-CM

## 2020-12-03 ENCOUNTER — Encounter (HOSPITAL_BASED_OUTPATIENT_CLINIC_OR_DEPARTMENT_OTHER): Payer: Self-pay

## 2020-12-03 ENCOUNTER — Ambulatory Visit (HOSPITAL_BASED_OUTPATIENT_CLINIC_OR_DEPARTMENT_OTHER): Payer: BLUE CROSS/BLUE SHIELD | Admitting: Obstetrics & Gynecology

## 2020-12-03 ENCOUNTER — Ambulatory Visit (INDEPENDENT_AMBULATORY_CARE_PROVIDER_SITE_OTHER): Payer: BLUE CROSS/BLUE SHIELD

## 2020-12-03 DIAGNOSIS — J309 Allergic rhinitis, unspecified: Secondary | ICD-10-CM | POA: Diagnosis not present

## 2020-12-16 ENCOUNTER — Ambulatory Visit (INDEPENDENT_AMBULATORY_CARE_PROVIDER_SITE_OTHER): Payer: BLUE CROSS/BLUE SHIELD

## 2020-12-16 DIAGNOSIS — J309 Allergic rhinitis, unspecified: Secondary | ICD-10-CM

## 2020-12-31 ENCOUNTER — Ambulatory Visit (INDEPENDENT_AMBULATORY_CARE_PROVIDER_SITE_OTHER): Payer: BLUE CROSS/BLUE SHIELD

## 2020-12-31 DIAGNOSIS — J309 Allergic rhinitis, unspecified: Secondary | ICD-10-CM | POA: Diagnosis not present

## 2021-01-01 ENCOUNTER — Ambulatory Visit
Admission: RE | Admit: 2021-01-01 | Discharge: 2021-01-01 | Disposition: A | Discharge status: Home / Self Care | Payer: BLUE CROSS/BLUE SHIELD | Source: Ambulatory Visit | Attending: Obstetrics & Gynecology | Admitting: Obstetrics & Gynecology

## 2021-01-01 ENCOUNTER — Other Ambulatory Visit: Payer: Self-pay

## 2021-01-01 DIAGNOSIS — Z9889 Other specified postprocedural states: Secondary | ICD-10-CM

## 2021-01-01 DIAGNOSIS — Z853 Personal history of malignant neoplasm of breast: Secondary | ICD-10-CM

## 2021-01-08 ENCOUNTER — Encounter (HOSPITAL_BASED_OUTPATIENT_CLINIC_OR_DEPARTMENT_OTHER): Payer: Self-pay | Admitting: Obstetrics & Gynecology

## 2021-01-08 ENCOUNTER — Ambulatory Visit (INDEPENDENT_AMBULATORY_CARE_PROVIDER_SITE_OTHER): Payer: BLUE CROSS/BLUE SHIELD | Admitting: Obstetrics & Gynecology

## 2021-01-08 ENCOUNTER — Other Ambulatory Visit (HOSPITAL_COMMUNITY)
Admission: RE | Admit: 2021-01-08 | Discharge: 2021-01-08 | Disposition: A | Discharge status: Home / Self Care | Payer: BLUE CROSS/BLUE SHIELD | Source: Ambulatory Visit | Attending: Obstetrics & Gynecology | Admitting: Obstetrics & Gynecology

## 2021-01-08 ENCOUNTER — Other Ambulatory Visit: Payer: Self-pay

## 2021-01-08 VITALS — BP 110/77 | HR 62 | Ht 66.0 in | Wt 156.2 lb

## 2021-01-08 DIAGNOSIS — Z01419 Encounter for gynecological examination (general) (routine) without abnormal findings: Secondary | ICD-10-CM | POA: Diagnosis not present

## 2021-01-08 DIAGNOSIS — Z86010 Personal history of colonic polyps: Secondary | ICD-10-CM | POA: Diagnosis not present

## 2021-01-08 DIAGNOSIS — Z9889 Other specified postprocedural states: Secondary | ICD-10-CM | POA: Diagnosis not present

## 2021-01-08 DIAGNOSIS — Z124 Encounter for screening for malignant neoplasm of cervix: Secondary | ICD-10-CM | POA: Diagnosis not present

## 2021-01-08 DIAGNOSIS — D251 Intramural leiomyoma of uterus: Secondary | ICD-10-CM

## 2021-01-08 DIAGNOSIS — Z800 Family history of malignant neoplasm of digestive organs: Secondary | ICD-10-CM

## 2021-01-08 NOTE — Patient Instructions (Addendum)
Sports Medicine:  Barbara Fuentes [address], [place], [place] [address] Phone: [phone]

## 2021-01-08 NOTE — Progress Notes (Signed)
55 y.o. [id number] [name] here for annual exam.  Doing well.  Denies vaginal bleeding.  Is frustrated with weight in middle/waist region.  Having some sciatica issues.  Has not seen anyone.  Options discussed.    Reports she has given up alcohol which helped her not smoke.     Patient's last menstrual period was 04/25/2015.          Sexually active: Yes.    The current method of family planning is none.    Exercising: No.  Smoker:  no  Health Maintenance: Pap:  01/17/2018 Negative History of abnormal Pap:  remote hx MMG:  01/01/2021 Negative Colonoscopy:  11/20/2020, Dr Havery Moros BMD:   none TDaP:  2014 Hep C testing: 2022 Screening Labs: 07/2020   reports that she quit smoking about 10 years ago. Her smoking use included cigarettes. She has never used smokeless tobacco. She reports that she does not currently use alcohol. She reports that she does not use drugs.  Past Medical History:  Diagnosis Date   Abnormal Pap smear of cervix    before children   Alkaline phosphatase deficiency    29 in 9/11   Allergic state 08/23/2015   Arthritis    hip   Breast cancer of lower-outer quadrant of right female breast ([place]) 12/20/2015   right breast   Eustachian tube dysfunction 02/2014   H/O recurrent pneumonia 08/23/2015   History of chicken pox 08/23/2015   History of radiation therapy 03/05/16- 04/02/16   Right Breast treated to 40.05 Gy in 15 fractions & Right Breast boost to 10 Gy in 5 fractions.    Low BP    Personal history of radiation therapy    2017 right breast   Pneumonia    RAD (reactive airway disease)     FORMER smoker , RAD with RTI's, cat or allergen exposure   Sacral pain 2014   trauma ( fall)    Past Surgical History:  Procedure Laterality Date   BREAST BIOPSY Right 12/19/2015   BREAST LUMPECTOMY Right 01/20/2016   Procedure: RIGHT BREAST LUMPECTOMY;  Surgeon: [name], MD;  Location: [facility];  Service: General;  Laterality: Right;    BREAST LUMPECTOMY WITH RADIOACTIVE SEED LOCALIZATION Right 01/10/2016   Procedure: BREAST LUMPECTOMY WITH RADIOACTIVE SEED LOCALIZATION;  Surgeon: [name], MD;  Location: [facility];  Service: General;  Laterality: Right;   CERVICAL FUSION  07/30/2010   & Discectomy, Dr Vertell Limber ( @ 3 levels)/has plate and 6 screws   COLONOSCOPY  07/23/2017   Dr.Armbruster   POLYPECTOMY     SEPTOPLASTY  1983   for septal deviation   TONSILLECTOMY     uterine ablation  2008   Dr Ronita Hipps   WISDOM TOOTH EXTRACTION      Current Outpatient Medications  Medication Sig Dispense Refill   albuterol (PROAIR HFA) 108 (90 Base) MCG/ACT inhaler Inhale 2 puffs into the lungs every 4 (four) hours as needed for wheezing or shortness of breath. 18 g 1   EPINEPHrine (EPIPEN 2-PAK) 0.3 mg/0.3 mL IJ SOAJ injection Use as directed for severe allergic reactions 2 each 2   fexofenadine-pseudoephedrine (ALLEGRA-D 24) 180-240 MG 24 hr tablet Take 1 tablet by mouth daily.     fluticasone (FLONASE) 50 MCG/ACT nasal spray Place 1-2 sprays into both nostrils daily as needed (for stuffy nose). 16 g 5   Probiotic Product ([place]) Take by mouth. Take one pill daily  UNABLE TO FIND Take 2 capsules by mouth daily. Med Name: Essential for Women Multivitamin     UNABLE TO FIND Med Name: Allergy Shots weekly     No current facility-administered medications for this visit.    Family History  Problem Relation Age of Onset   Heart disease Mother        cardiac rest, epinephrine with Novocaine   Arthritis Mother    Diabetes Father        Type II, AO   Alcohol abuse Father    Depression Father    Prostate cancer Father 59   Lung cancer Father 54   Heart disease Father        chf   Heart disease Sister    Depression Brother    HIV Brother        AIDS   Cancer Paternal Aunt        probable ovarian   Other Maternal Grandmother        issues w/ breast in her 90s--may have been breast cancer    Colon cancer Maternal Grandfather        dx late 8s; w/ colostomy   COPD Paternal Grandmother    Alcohol abuse Paternal Grandmother        smoker   Emphysema Paternal Grandmother    Bronchitis Paternal Grandmother    Heart attack Paternal Grandfather 22   Heart disease Paternal Grandfather    Alcohol abuse Paternal Grandfather        smoker   Stroke Neg Hx    Asthma Neg Hx    Allergic rhinitis Neg Hx    Angioedema Neg Hx    Eczema Neg Hx    Immunodeficiency Neg Hx    Urticaria Neg Hx    Colon polyps Neg Hx    Esophageal cancer Neg Hx    Rectal cancer Neg Hx    Stomach cancer Neg Hx     Review of Systems  All other systems reviewed and are negative.  Exam:   BP 110/77 (BP Location: Left Arm, Patient Position: Sitting, Cuff Size: Small)   Pulse 62   Ht '5\' 6"'$  (1.676 m) Comment: reported  Wt 156 lb 3.2 oz (70.9 kg)   LMP 04/25/2015 Comment: Uterine ablations  BMI 25.21 kg/m   Height: '5\' 6"'$  (167.6 cm) (reported)  General appearance: alert, cooperative and appears stated age Head: Normocephalic, without obvious abnormality, atraumatic Neck: no adenopathy, supple, symmetrical, trachea midline and thyroid normal to inspection and palpation Lungs: clear to auscultation bilaterally Breasts:right breast scarring and radiation changes that are stable, no masses, nipple discharge or LAD; left breast without masses, skin changes, nipple discharge, LAD Heart: regular rate and rhythm Abdomen: soft, non-tender; bowel sounds normal; no masses,  no organomegaly Extremities: extremities normal, atraumatic, no cyanosis or edema Skin: Skin color, texture, turgor normal. No rashes or lesions Lymph nodes: Cervical, supraclavicular, and axillary nodes normal. No abnormal inguinal nodes palpated Neurologic: Grossly normal   Pelvic: External genitalia:  no lesions              Urethra:  normal appearing urethra with no masses, tenderness or lesions              Bartholins and Skenes: normal                  Vagina: normal appearing vagina with normal color and no discharge, no lesions              Cervix:  no lesions              Pap taken: Yes.   Bimanual Exam:  Uterus:  enlarged, about 10 weeks size              Adnexa: no mass, fullness, tenderness               Rectovaginal: Confirms               Anus:  normal sphincter tone, no lesions  Chaperone, Octaviano Batty, CMA, was present for exam.  Assessment/Plan: 1. Well woman exam with routine gynecological exam - Pap smear obtained today with HR HPV testing - Mammogram 12/2020 - Colonoscopy 10/2020 - Bone mineral density reviewed but not indicated at this time - lab work done done with Dr. Randel Pigg.  Reviewed with pt today. - vaccines reviewed/updated.  I do not have second booster for pt.   2. History of endometrial ablation - amenorrheic since  3. Intramural leiomyoma of uterus - stable on exam today - last gyn ultrasound 2021  4. H/O adenomatous polyp of colon - colonoscopy UTD  5. Family history of colon cancer

## 2021-01-13 ENCOUNTER — Ambulatory Visit (INDEPENDENT_AMBULATORY_CARE_PROVIDER_SITE_OTHER): Payer: BLUE CROSS/BLUE SHIELD

## 2021-01-13 ENCOUNTER — Other Ambulatory Visit: Payer: Self-pay | Admitting: Family Medicine

## 2021-01-13 DIAGNOSIS — J309 Allergic rhinitis, unspecified: Secondary | ICD-10-CM | POA: Diagnosis not present

## 2021-01-13 LAB — CYTOLOGY - PAP
Comment: NEGATIVE
Diagnosis: NEGATIVE
High risk HPV: NEGATIVE

## 2021-01-16 ENCOUNTER — Encounter (HOSPITAL_BASED_OUTPATIENT_CLINIC_OR_DEPARTMENT_OTHER): Payer: Self-pay

## 2021-01-16 MED ORDER — FLUTICASONE PROPIONATE 50 MCG/ACT NA SUSP
1.0000 | Freq: Every day | NASAL | 5 refills | Status: DC | PRN
Start: 2021-01-16 — End: 2022-01-28

## 2021-01-20 DIAGNOSIS — J3089 Other allergic rhinitis: Secondary | ICD-10-CM

## 2021-01-20 NOTE — Progress Notes (Signed)
VIALS MADE. EXP 01-20-22

## 2021-01-29 ENCOUNTER — Ambulatory Visit (INDEPENDENT_AMBULATORY_CARE_PROVIDER_SITE_OTHER): Payer: BLUE CROSS/BLUE SHIELD

## 2021-01-29 DIAGNOSIS — J309 Allergic rhinitis, unspecified: Secondary | ICD-10-CM | POA: Diagnosis not present

## 2021-02-21 ENCOUNTER — Ambulatory Visit (INDEPENDENT_AMBULATORY_CARE_PROVIDER_SITE_OTHER): Payer: BLUE CROSS/BLUE SHIELD

## 2021-02-21 DIAGNOSIS — J309 Allergic rhinitis, unspecified: Secondary | ICD-10-CM | POA: Diagnosis not present

## 2021-02-24 ENCOUNTER — Encounter: Payer: Self-pay | Admitting: Family Medicine

## 2021-02-24 ENCOUNTER — Other Ambulatory Visit: Payer: Self-pay | Admitting: Family Medicine

## 2021-02-24 DIAGNOSIS — M5430 Sciatica, unspecified side: Secondary | ICD-10-CM

## 2021-02-27 ENCOUNTER — Encounter: Payer: Self-pay | Admitting: Physical Therapy

## 2021-02-27 ENCOUNTER — Ambulatory Visit: Payer: BLUE CROSS/BLUE SHIELD | Attending: Family Medicine | Admitting: Physical Therapy

## 2021-02-27 ENCOUNTER — Other Ambulatory Visit: Payer: Self-pay

## 2021-02-27 DIAGNOSIS — R293 Abnormal posture: Secondary | ICD-10-CM | POA: Diagnosis present

## 2021-02-27 DIAGNOSIS — M5430 Sciatica, unspecified side: Secondary | ICD-10-CM | POA: Insufficient documentation

## 2021-02-27 DIAGNOSIS — R2689 Other abnormalities of gait and mobility: Secondary | ICD-10-CM | POA: Insufficient documentation

## 2021-02-27 DIAGNOSIS — M6281 Muscle weakness (generalized): Secondary | ICD-10-CM | POA: Insufficient documentation

## 2021-02-27 DIAGNOSIS — M62838 Other muscle spasm: Secondary | ICD-10-CM | POA: Diagnosis present

## 2021-02-27 DIAGNOSIS — M5432 Sciatica, left side: Secondary | ICD-10-CM | POA: Diagnosis present

## 2021-02-27 NOTE — Patient Instructions (Signed)
   Access Code: [email or web address] URL: [email or web address] Date: 02/27/2021 Prepared by: Annie Paras  Exercises Hooklying Hamstring Stretch with Strap - 2-3 x daily - 7 x weekly - 3 reps - 30 sec hold Supine ITB Stretch with Strap - 2-3 x daily - 7 x weekly - 3 reps - 30 sec hold Supine Piriformis Stretch with Foot on Ground - 2-3 x daily - 7 x weekly - 3 reps - 30 sec hold Supine Figure 4 Piriformis Stretch - 2-3 x daily - 7 x weekly - 3 reps - 30 sec hold

## 2021-02-27 NOTE — Therapy (Signed)
[facility] [address]  [place], [place], [address] Phone: [phone]   Fax:  [phone]  Physical Therapy Evaluation  Patient Details  Name: Barbara Fuentes MRN: [id number] Date of Birth: October 19, 1965 Referring Provider (PT): [name], MD   Encounter Date: 02/27/2021   PT End of Session - 02/27/21 0804     Visit Number 1    Number of Visits 12    Date for PT Re-Evaluation 04/10/21    Authorization Type BCBS - VL: 54    PT Start Time 0804    PT Stop Time 1448    PT Time Calculation (min) 45 min    Activity Tolerance Patient tolerated treatment well;Patient limited by pain    Behavior During Therapy [facility] for tasks assessed/performed             Past Medical History:  Diagnosis Date   Abnormal Pap smear of cervix    before children   Alkaline phosphatase deficiency    28 in 9/11   Allergic state 08/23/2015   Arthritis    hip   Breast cancer of lower-outer quadrant of right female breast ([place]) 12/20/2015   right breast   Eustachian tube dysfunction 02/2014   H/O recurrent pneumonia 08/23/2015   History of chicken pox 08/23/2015   History of radiation therapy 03/05/16- 04/02/16   Right Breast treated to 40.05 Gy in 15 fractions & Right Breast boost to 10 Gy in 5 fractions.    Low BP    Personal history of radiation therapy    2017 right breast   Pneumonia    RAD (reactive airway disease)     FORMER smoker , RAD with RTI's, cat or allergen exposure   Sacral pain 2014   trauma ( fall)    Past Surgical History:  Procedure Laterality Date   BREAST BIOPSY Right 12/19/2015   BREAST LUMPECTOMY Right 01/20/2016   Procedure: RIGHT BREAST LUMPECTOMY;  Surgeon: [name], MD;  Location: [facility];  Service: General;  Laterality: Right;   BREAST LUMPECTOMY WITH RADIOACTIVE SEED LOCALIZATION Right 01/10/2016   Procedure: BREAST LUMPECTOMY WITH RADIOACTIVE SEED LOCALIZATION;  Surgeon: [name], MD;   Location: [facility];  Service: General;  Laterality: Right;   CERVICAL FUSION  07/30/2010   & Discectomy, Dr Vertell Limber ( @ 3 levels)/has plate and 6 screws   COLONOSCOPY  07/23/2017   Dr.Armbruster   POLYPECTOMY     SEPTOPLASTY  1983   for septal deviation   TONSILLECTOMY     uterine ablation  2008   Dr Ronita Hipps   WISDOM TOOTH EXTRACTION      There were no vitals filed for this visit.    Subjective Assessment - 02/27/21 0807     Subjective Pt reports > 4-6 months ago she thought she pulled a glute muscle but pain never resolved - now pain extends down leg and pt feels that it is likely nerve pain as she has h/o cervical fusion with similar issues in her R UE prior to that surgery. Pain has gotten so severe that she cannot bend over to dress her lower body including putting on her shoes. She does note mild urinary incontinence but preexisting pain.    Limitations Sitting    How long can you sit comfortably? the longer she sits, the harder it is to get up    Diagnostic tests none    Patient Stated Goals "make it stop"  Currently in Pain? Yes    Pain Score 5    up to 10/10 at worst   Pain Location Buttocks    Pain Orientation Left    Pain Descriptors / Indicators Stabbing    Pain Type Chronic pain    Pain Radiating Towards pain down posterior L LE to ankle, denies numbness & tingling    Pain Onset More than a month ago   ~6+ months ago   Pain Frequency Intermittent    Aggravating Factors  bending forward, dressing lower body, sit to stand, getting in/out of car - worse in morning than evening    Pain Relieving Factors Ibuprofen, has recently tried Icy/Hot patch    Effect of Pain on Daily Activities limits lower body dressing, difficulty caring for her horses                [facility] PT Assessment - 02/27/21 0804       Assessment   Medical Diagnosis L sciatica    Referring Provider (PT) [name], MD    Onset Date/Surgical Date --   ~4-6 months   Hand  Dominance Right    Next MD Visit 08/25/21    Prior Therapy PT prior to neck fusion      Precautions   Precautions None      Restrictions   Weight Bearing Restrictions No      Balance Screen   Has the patient fallen in the past 6 months No    Has the patient had a decrease in activity level because of a fear of falling?  No    Is the patient reluctant to leave their home because of a fear of falling?  No      Home Social worker Private residence    Living Arrangements Spouse/significant other;Children    Type of [place] to enter    Entrance Stairs-Number of Steps 4    [place] Two level;Bed/bath upstairs      Prior Function   Level of Independence Independent    Vocation Full time employment    Vocation Requirements deskwork    Leisure walking 3 miles - 3x/wk, horses      Cognition   Overall Cognitive Status Within Functional Limits for tasks assessed      Observation/Other Assessments   Focus on Therapeutic Outcomes (FOTO)  Lumbar = 54; predicted D/C FS=65      Posture/Postural Control   Posture/Postural Control Postural limitations    Postural Limitations Decreased lumbar lordosis   mild     ROM / Strength   AROM / PROM / Strength AROM;Strength      AROM   AROM Assessment Site Lumbar    Lumbar Flexion hands just below knees - pain    Lumbar Extension WNL    Lumbar - Right Side Bend WNL    Lumbar - Left Side Bend hand to fibular head - mild pain    Lumbar - Right Rotation WNL    Lumbar - Left Rotation WNL      Strength   Strength Assessment Site Hip;Knee;Ankle    Right/Left Hip Right;Left    Right Hip Flexion 4/5    Right Hip Extension 4+/5    Right Hip External Rotation  4-/5    Right Hip Internal Rotation 4+/5    Right Hip ABduction 4+/5    Right Hip ADduction 4+/5    Left Hip Flexion 4/5    Left Hip  Extension 4-/5    Left Hip External Rotation 3+/5   limited ROM + pain   Left Hip Internal Rotation 4+/5     Left Hip ABduction 4/5    Left Hip ADduction 4/5    Right/Left Knee Right;Left    Right Knee Flexion 5/5    Right Knee Extension 5/5    Left Knee Flexion 5/5    Left Knee Extension 5/5    Right/Left Ankle Right;Left    Right Ankle Dorsiflexion 4/5    Right Ankle Plantar Flexion 5/5    Left Ankle Dorsiflexion 4/5    Left Ankle Plantar Flexion 5/5      Flexibility   Soft Tissue Assessment /Muscle Length yes    Hamstrings mod tight L, nild tight R    ITB mod tight L, nild tight R    Piriformis mod tight L, nild tight R      Palpation   Palpation comment mild increased muscle tension and TTP in L lower paraspinals, glutes and piriformis      Special Tests    Special Tests Lumbar    Lumbar Tests Slump Test;Prone Knee Bend Test      Slump test   Findings Positive    Side Left      Prone Knee Bend Test   Findings Negative                        Objective measurements completed on examination: See above findings.       [facility] Adult PT Treatment/Exercise - 02/27/21 0804       Exercises   Exercises Lumbar      Lumbar Exercises: Stretches   Passive Hamstring Stretch Left;3 reps;30 seconds    Passive Hamstring Stretch Limitations hookying with strap    Lower Trunk Rotation Limitations attempted but no significant tightness or stretch noted    ITB Stretch Left;3 reps;30 seconds    ITB Stretch Limitations supine crossbody with strap    Piriformis Stretch Left;2 reps;30 seconds    Piriformis Stretch Limitations hooklying KTOS    Figure 4 Stretch 3 reps;Supine;With overpressure    Figure 4 Stretch Limitations mutiple positions attempted - pt noting best stretch with B LE figure-4 to chest                     PT Education - 02/27/21 0846     Education Details PT eval findings, anticipated POC and initial HEP - Access Code: [email or web address]    Person(s) Educated Patient    Methods Explanation;Demonstration;Verbal cues;Handout    Comprehension Verbalized  understanding;Verbal cues required;Returned demonstration;Need further instruction              PT Short Term Goals - 02/27/21 0849       PT SHORT TERM GOAL #1   Title Patient will be independent with initial HEP    Status New    Target Date 03/13/21      PT SHORT TERM GOAL #2   Title Patient will verbalize/demonstrate understanding of neutral spine posture and proper body mechanics to reduce strain on lumbar spine    Status New    Target Date 03/20/21               PT Long Term Goals - 02/27/21 0849       PT LONG TERM GOAL #1   Title Patient will be independent with ongoing/advanced HEP for self-management at home in order to build upon functional  gains in therapy    Status New    Target Date 04/10/21      PT LONG TERM GOAL #2   Title Patient to demonstrate ability to achieve and maintain good spinal alignment/posturing with mobillity and daily tasks    Status New    Target Date 04/10/21      PT LONG TERM GOAL #3   Title Patient to improve lumbar AROM to WNL/WFL without pain provocation to allow for increased ease of lower body dressing    Status New    Target Date 04/10/21      PT LONG TERM GOAL #4   Title Patient will demonstrate improved proximal B LE strength to >/= 4+/5 for improved stability and ease of mobility    Status New    Target Date 04/10/21      PT LONG TERM GOAL #5   Title Patient to report ability to perform ADLs, household, and work-related tasks without limitation due to lumbar or L LE pain, LOM or weakness    Status New    Target Date 04/10/21                    Plan - 02/27/21 0849     Clinical Impression Statement Barbara Fuentes is a 55 y/o female who presents to OP for low complexity eval for L sided sciatica.  She reports pain originated ~6+ months ago and initially felt like she strained her glute muscles but now pain extending down back of L LE to ankle with "nervy' feeling. Pain at time of eval was 5/10 in L buttock, but pt  reports can increase up to 10/10 at worst, especially with bending forward or rising to stand after prolonged sitting. She denies numbness and tingling, although does admit to mild incontinence which was preexisting before onset of current pain. Pain causes her to have to pause upon rising to stand from sitting and limits LE body dressing and other activities that require bending forward including caring for her horse. Deficits include L low back/upper buttock pain with L LE radicular pain, limited and painful lumbar flexion and to a lesser degree L side bending as well as L hip ER, increased muscle tension and limited proximal LE flexibility L>R especially in HS, ITB and piriformis, and mild to moderate proximal LE weakness L>R. Given muscle imbalance and tightness, radicular symptoms potentially indicative of gluteal tendonitis or piriformis syndrome. Barbara Fuentes will benefit from skilled PT to address pain and above deficits to allow for improved quality of life and return to normal level of activity with reduced pain.    Personal Factors and Comorbidities Time since onset of injury/illness/exacerbation;Past/Current Experience;Comorbidity 3+;Profession    Comorbidities 3-level ACDF 2012, breast cancer s/p lumpectomy, RAD, asthma, IBS, urinary incontinence    Examination-Activity Limitations Bend;Dressing;Sit;Transfers    Examination-Participation Restrictions Cleaning;Community Activity;Driving;Interpersonal Relationship;Laundry;Occupation;Yard Work;Other   riding & caring for her horse   Stability/Clinical Decision Making Stable/Uncomplicated    Clinical Decision Making Low    Rehab Potential Good    PT Frequency 2x / week    PT Duration 6 weeks    PT Treatment/Interventions ADLs/Self Care Home Management;Cryotherapy;Electrical Stimulation;Iontophoresis 4mg /ml Dexamethasone;Moist Heat;Traction;Ultrasound;Functional mobility training;Therapeutic activities;Therapeutic exercise;Balance training;Neuromuscular  re-education;Patient/family education;Manual techniques;Passive range of motion;Dry needling;Taping;Spinal Manipulations;Joint Manipulations    PT Next Visit Plan Review initial HEP; lumbopelvic strengthening/stabilization + potentially introduce extension based exercises; manual therapy and modalities PRN    PT Home Exercise Plan Access Code: [email or web address] (11/3)    Consulted and Agree with  Plan of Care Patient             Patient will benefit from skilled therapeutic intervention in order to improve the following deficits and impairments:  Decreased activity tolerance, Decreased knowledge of precautions, Decreased mobility, Decreased range of motion, Decreased strength, Increased fascial restricitons, Increased muscle spasms, Impaired perceived functional ability, Impaired flexibility, Improper body mechanics, Postural dysfunction, Pain  Visit Diagnosis: Sciatica, left side  Other muscle spasm  Muscle weakness (generalized)  Abnormal posture  Other abnormalities of gait and mobility     Problem List Patient Active Problem List   Diagnosis Date Noted   H/O adenomatous polyp of colon 08/24/2020   Hyperglycemia 08/24/2020   Snoring 08/23/2019   Insomnia 08/23/2019   COVID-19 05/31/2019   Food intolerance in adult 11/29/2018   Seasonal allergic conjunctivitis 11/29/2018   Allergic rhinitis 11/29/2018   History of asthma 11/29/2018   Intramural leiomyoma of uterus 02/06/2018   Reactive airways dysfunction syndrome ([place]) 07/20/2016   Genetic testing 02/24/2016   Family history of colon cancer 12/27/2015   Malignant neoplasm of lower-outer quadrant of right breast of female, estrogen receptor negative ([place]) 12/20/2015   Preventative health care 08/23/2015   Perimenopause 08/23/2015   History of chicken pox 08/23/2015   H/O recurrent pneumonia 08/23/2015   Allergic state 08/23/2015   Alkaline phosphatase deficiency 11/10/2012   RAD (reactive airway disease) 05/25/2012   Other  irritable bowel syndrome 10/15/2008    Percival Spanish, PT 02/27/2021, 12:37 PM  [facility] [address]  [place], [place], [address] Phone: [phone]   Fax:  [phone]  Name: Barbara Fuentes MRN: [id number] Date of Birth: 1966/02/20

## 2021-03-04 ENCOUNTER — Other Ambulatory Visit: Payer: Self-pay

## 2021-03-04 ENCOUNTER — Ambulatory Visit: Payer: BLUE CROSS/BLUE SHIELD | Admitting: Physical Therapy

## 2021-03-04 ENCOUNTER — Encounter: Payer: Self-pay | Admitting: Physical Therapy

## 2021-03-04 DIAGNOSIS — R293 Abnormal posture: Secondary | ICD-10-CM

## 2021-03-04 DIAGNOSIS — M6281 Muscle weakness (generalized): Secondary | ICD-10-CM

## 2021-03-04 DIAGNOSIS — M62838 Other muscle spasm: Secondary | ICD-10-CM

## 2021-03-04 DIAGNOSIS — R2689 Other abnormalities of gait and mobility: Secondary | ICD-10-CM

## 2021-03-04 DIAGNOSIS — M5432 Sciatica, left side: Secondary | ICD-10-CM | POA: Diagnosis not present

## 2021-03-04 NOTE — Therapy (Signed)
[facility] [address]  [place], [place], [address] Phone: [phone]   Fax:  [phone]  Physical Therapy Treatment  Patient Details  Name: Barbara Fuentes MRN: [id number] Date of Birth: June 27, 1965 Referring Provider (PT): [name], MD   Encounter Date: 03/04/2021   PT End of Session - 03/04/21 0802     Visit Number 2    Number of Visits 12    Date for PT Re-Evaluation 04/10/21    Authorization Type BCBS - VL: 93    PT Start Time 0802    PT Stop Time 0851    PT Time Calculation (min) 49 min    Activity Tolerance Patient tolerated treatment well;Patient limited by pain    Behavior During Therapy [facility] for tasks assessed/performed             Past Medical History:  Diagnosis Date   Abnormal Pap smear of cervix    before children   Alkaline phosphatase deficiency    29 in 9/11   Allergic state 08/23/2015   Arthritis    hip   Breast cancer of lower-outer quadrant of right female breast ([place]) 12/20/2015   right breast   Eustachian tube dysfunction 02/2014   H/O recurrent pneumonia 08/23/2015   History of chicken pox 08/23/2015   History of radiation therapy 03/05/16- 04/02/16   Right Breast treated to 40.05 Gy in 15 fractions & Right Breast boost to 10 Gy in 5 fractions.    Low BP    Personal history of radiation therapy    2017 right breast   Pneumonia    RAD (reactive airway disease)     FORMER smoker , RAD with RTI's, cat or allergen exposure   Sacral pain 2014   trauma ( fall)    Past Surgical History:  Procedure Laterality Date   BREAST BIOPSY Right 12/19/2015   BREAST LUMPECTOMY Right 01/20/2016   Procedure: RIGHT BREAST LUMPECTOMY;  Surgeon: [name], MD;  Location: [facility];  Service: General;  Laterality: Right;   BREAST LUMPECTOMY WITH RADIOACTIVE SEED LOCALIZATION Right 01/10/2016   Procedure: BREAST LUMPECTOMY WITH RADIOACTIVE SEED LOCALIZATION;  Surgeon: [name], MD;   Location: [place];  Service: General;  Laterality: Right;   CERVICAL FUSION  07/30/2010   & Discectomy, Dr Vertell Limber ( @ 3 levels)/has plate and 6 screws   COLONOSCOPY  07/23/2017   Dr.Armbruster   POLYPECTOMY     SEPTOPLASTY  1983   for septal deviation   TONSILLECTOMY     uterine ablation  2008   Dr Ronita Hipps   WISDOM TOOTH EXTRACTION      There were no vitals filed for this visit.   Subjective Assessment - 03/04/21 0804     Subjective Upon mental review, pt discovered that her pain orginated after her father passed whom she had been caring for the last year of his life. Pain does originate in her back and then spreads down her leg. Feels like the HEP stretches may be making the pain worse.    Patient Stated Goals "make it stop"    Currently in Pain? Yes    Pain Score 5     Pain Location Buttocks    Pain Orientation Left    Pain Descriptors / Indicators Stabbing    Pain Type Chronic pain    Pain Frequency Intermittent  [name] Adult PT Treatment/Exercise - 03/04/21 0802       Exercises   Exercises Lumbar      Lumbar Exercises: Stretches   Passive Hamstring Stretch Left;3 reps;30 seconds    Passive Hamstring Stretch Limitations hookying with strap    ITB Stretch Left;3 reps;30 seconds    ITB Stretch Limitations supine crossbody with strap    Piriformis Stretch Left;2 reps;30 seconds    Piriformis Stretch Limitations hooklying KTOS    Figure 4 Stretch 3 reps;30 seconds;Supine;With overpressure    Figure 4 Stretch Limitations better tolerance for single leg figure-4 to chest      Lumbar Exercises: Aerobic   Recumbent Bike L1-2 x 6 min      Lumbar Exercises: Supine   Pelvic Tilt 10 reps;5 seconds    Clam 10 reps;5 seconds    Clam Limitations PPT + red TB clam    Bridge 10 reps;5 seconds    Bridge Limitations + red TB hip ABD isometric      Manual Therapy   Manual Therapy Soft tissue  mobilization;Myofascial release;Other (comment)    Manual therapy comments skilled palpation and monitoring during DN    Soft tissue mobilization STM/DTM to L glutes & piriformis    Myofascial Release manual TPR to medial L glute medius & piriformis   better tolerance for stretches following DN   Other Manual Therapy Provided instruction in self-STM/TPR for glutes using tennis ball on wall              Trigger Point Dry Needling - 03/04/21 0802     Consent Given? Yes    Education Handout Provided Yes    Muscles Treated Back/Hip Gluteus medius;Piriformis   Left medial   Gluteus Medius Response Twitch response elicited;Palpable increased muscle length    Piriformis Response Twitch response elicited;Palpable increased muscle length                   PT Education - 03/04/21 0850     Education Details HEP update - basic strengthening - Access Code: [email or web address];  DN rational, procedure, outcomes, potential side effects, and recommended post-treatment exercises/activity    Person(s) Educated Patient    Methods Explanation;Demonstration;Verbal cues;Tactile cues;Handout    Comprehension Verbalized understanding;Verbal cues required;Tactile cues required;Returned demonstration;Need further instruction              PT Short Term Goals - 03/04/21 0808       PT SHORT TERM GOAL #1   Title Patient will be independent with initial HEP    Status On-going    Target Date 03/13/21      PT SHORT TERM GOAL #2   Title Patient will verbalize/demonstrate understanding of neutral spine posture and proper body mechanics to reduce strain on lumbar spine    Status On-going    Target Date 03/20/21               PT Long Term Goals - 03/04/21 0808       PT LONG TERM GOAL #1   Title Patient will be independent with ongoing/advanced HEP for self-management at home in order to build upon functional gains in therapy    Status On-going    Target Date 04/10/21      PT LONG TERM GOAL #2    Title Patient to demonstrate ability to achieve and maintain good spinal alignment/posturing with mobillity and daily tasks    Status On-going    Target Date 04/10/21      PT LONG  TERM GOAL #3   Title Patient to improve lumbar AROM to WNL/WFL without pain provocation to allow for increased ease of lower body dressing    Status On-going    Target Date 04/10/21      PT LONG TERM GOAL #4   Title Patient will demonstrate improved proximal B LE strength to >/= 4+/5 for improved stability and ease of mobility    Status On-going    Target Date 04/10/21      PT LONG TERM GOAL #5   Title Patient to report ability to perform ADLs, household, and work-related tasks without limitation due to lumbar or L LE pain, LOM or weakness    Status On-going    Target Date 04/10/21                   Plan - 03/04/21 0810     Clinical Impression Statement Kyri reports upon reflection she does recognize that her pain started in her back and she does note pain in low back first thing in the morning but pain usually shifts to and remains in her buttocks and leg t/o the day. She note limited tolerance for HEP stretches, esp piriformis stretches noting difficulty placing her L foot on her R knee to even initiate the stretches. Increased muscle tension identified in L medial glutes and piriformis today with pressure on medial glute medius TP reproducing her leg pain. Addressed TP with manual STM/DTM and TPR with some relief noted but increased tension still remaining, therefore incorporated DN. After explanation of DN rational, procedures, outcomes and potential side effects, patient verbalized consent to DN treatment in conjunction with manual STM/DTM and TPR to reduce ttp/muscle tension. Muscles treated include medial L glute medius and piriformis. DN produced normal response with good twitches elicited resulting in palpable reduction in pain/ttp and muscle tension as well as improved tolerance and ROM with  stretches. Pt educated to expect mild to moderate muscle soreness for up to 24-48 hrs and instructed to continue prescribed home exercise program and current activity level with pt verbalizing understanding of theses instructions. Introduced basic lumbopelvic strengthening with HEP updated accordingly as pt will not be able to return to PT until the end of next week.    Comorbidities 3-level ACDF 2012, breast cancer s/p lumpectomy, RAD, asthma, IBS, urinary incontinence    Rehab Potential Good    PT Frequency 2x / week    PT Duration 6 weeks    PT Treatment/Interventions ADLs/Self Care Home Management;Cryotherapy;Electrical Stimulation;Iontophoresis 4mg /ml Dexamethasone;Moist Heat;Traction;Ultrasound;Functional mobility training;Therapeutic activities;Therapeutic exercise;Balance training;Neuromuscular re-education;Patient/family education;Manual techniques;Passive range of motion;Dry needling;Taping;Spinal Manipulations;Joint Manipulations    PT Next Visit Plan Assess long term response to DN; review HEP PRN; lumbopelvic strengthening/stabilization + potentially introduce extension based exercises; manual therapy including DN as indicated & benefit noted; modalities PRN    PT Home Exercise Plan Access Code: [email or web address] (11/3, updated 11/8)    Consulted and Agree with Plan of Care Patient             Patient will benefit from skilled therapeutic intervention in order to improve the following deficits and impairments:  Decreased activity tolerance, Decreased knowledge of precautions, Decreased mobility, Decreased range of motion, Decreased strength, Increased fascial restricitons, Increased muscle spasms, Impaired perceived functional ability, Impaired flexibility, Improper body mechanics, Postural dysfunction, Pain  Visit Diagnosis: Sciatica, left side  Other muscle spasm  Muscle weakness (generalized)  Abnormal posture  Other abnormalities of gait and mobility     Problem List Patient  Active Problem List   Diagnosis Date Noted   H/O adenomatous polyp of colon 08/24/2020   Hyperglycemia 08/24/2020   Snoring 08/23/2019   Insomnia 08/23/2019   COVID-19 05/31/2019   Food intolerance in adult 11/29/2018   Seasonal allergic conjunctivitis 11/29/2018   Allergic rhinitis 11/29/2018   History of asthma 11/29/2018   Intramural leiomyoma of uterus 02/06/2018   Reactive airways dysfunction syndrome ([place]) 07/20/2016   Genetic testing 02/24/2016   Family history of colon cancer 12/27/2015   Malignant neoplasm of lower-outer quadrant of right breast of female, estrogen receptor negative ([place]) 12/20/2015   Preventative health care 08/23/2015   Perimenopause 08/23/2015   History of chicken pox 08/23/2015   H/O recurrent pneumonia 08/23/2015   Allergic state 08/23/2015   Alkaline phosphatase deficiency 11/10/2012   RAD (reactive airway disease) 05/25/2012   Other irritable bowel syndrome 10/15/2008    Percival Spanish, PT 03/04/2021, 9:18 AM  [facility] [address]  [place], [place], [address] Phone: [phone]   Fax:  [phone]  Name: KYNNADI DICENSO MRN: [id number] Date of Birth: 07-27-1965

## 2021-03-04 NOTE — Patient Instructions (Addendum)
    Access Code: [email or web address] URL: [email or web address] Date: 03/04/2021 Prepared by: Annie Paras  Exercises Hooklying Hamstring Stretch with Strap - 2-3 x daily - 7 x weekly - 3 reps - 30 sec hold Supine ITB Stretch with Strap - 2-3 x daily - 7 x weekly - 3 reps - 30 sec hold Supine Piriformis Stretch with Foot on Ground - 2-3 x daily - 7 x weekly - 3 reps - 30 sec hold Supine Figure 4 Piriformis Stretch - 2-3 x daily - 7 x weekly - 3 reps - 30 sec hold Supine Posterior Pelvic Tilt - 1 x daily - 7 x weekly - 2 sets - 10 reps - 5 sec hold Hooklying Clamshell with Resistance - 1 x daily - 7 x weekly - 2 sets - 10 reps - 3-5 sec hold Supine Bridge with Resistance Band - 1 x daily - 7 x weekly - 2 sets - 10 reps - 5 sec hold Standing Glute Med Mobilization with Small Ball on Wall - 1 x daily - 7 x weekly - 1-2 reps - 1-2 min hold  Patient Education Trigger Point Dry Needling

## 2021-03-06 ENCOUNTER — Encounter: Payer: BLUE CROSS/BLUE SHIELD | Admitting: Physical Therapy

## 2021-03-10 ENCOUNTER — Ambulatory Visit (INDEPENDENT_AMBULATORY_CARE_PROVIDER_SITE_OTHER): Payer: BLUE CROSS/BLUE SHIELD | Admitting: *Deleted

## 2021-03-10 DIAGNOSIS — J309 Allergic rhinitis, unspecified: Secondary | ICD-10-CM | POA: Diagnosis not present

## 2021-03-13 ENCOUNTER — Other Ambulatory Visit: Payer: Self-pay

## 2021-03-13 ENCOUNTER — Ambulatory Visit: Payer: BLUE CROSS/BLUE SHIELD | Admitting: Physical Therapy

## 2021-03-13 ENCOUNTER — Encounter: Payer: Self-pay | Admitting: Physical Therapy

## 2021-03-13 DIAGNOSIS — M6281 Muscle weakness (generalized): Secondary | ICD-10-CM

## 2021-03-13 DIAGNOSIS — M62838 Other muscle spasm: Secondary | ICD-10-CM

## 2021-03-13 DIAGNOSIS — R293 Abnormal posture: Secondary | ICD-10-CM

## 2021-03-13 DIAGNOSIS — M5432 Sciatica, left side: Secondary | ICD-10-CM | POA: Diagnosis not present

## 2021-03-13 DIAGNOSIS — R2689 Other abnormalities of gait and mobility: Secondary | ICD-10-CM

## 2021-03-13 NOTE — Patient Instructions (Signed)
     Access Code: [email or web address] URL: [email or web address] Date: 03/13/2021 Prepared by: Annie Paras  Exercises Hooklying Hamstring Stretch with Strap - 2-3 x daily - 7 x weekly - 3 reps - 30 sec hold Supine ITB Stretch with Strap - 2-3 x daily - 7 x weekly - 3 reps - 30 sec hold Supine Piriformis Stretch with Foot on Ground - 2-3 x daily - 7 x weekly - 3 reps - 30 sec hold Supine Figure 4 Piriformis Stretch - 2-3 x daily - 7 x weekly - 3 reps - 30 sec hold Supine Posterior Pelvic Tilt - 1 x daily - 7 x weekly - 2 sets - 10 reps - 5 sec hold Hooklying Clamshell with Resistance - 1 x daily - 7 x weekly - 2 sets - 10 reps - 3-5 sec hold Supine Bridge with Resistance Band - 1 x daily - 7 x weekly - 2 sets - 10 reps - 5 sec hold Standing Glute Med Mobilization with Small Ball on Wall - 1 x daily - 7 x weekly - 1-2 reps - 1-2 min hold Static Prone on Elbows - 1 x daily - 7 x weekly - 2 sets - 10 reps - 3 sec hold Prone Press Up - 2 x daily - 7 x weekly - 2 sets - 10 reps - 2 sec hold Cat Cow - 2 x daily - 7 x weekly - 2 sets - 10 reps - 5 sec hold Bird Dog - 1 x daily - 7 x weekly - 2 sets - 10 reps - 5 sec hold Quadruped Fire Hydrant - 1 x daily - 7 x weekly - 2 sets - 10 reps - 3 sec hold  Patient Education Tax adviser

## 2021-03-13 NOTE — Therapy (Signed)
[facility] [address]  [place], [place], [address] Phone: [phone]   Fax:  [phone]  Physical Therapy Treatment  Patient Details  Name: Barbara Fuentes MRN: [id number] Date of Birth: 06/20/1965 Referring Provider (PT): [name], MD   Encounter Date: 03/13/2021   PT End of Session - 03/13/21 0931     Visit Number 3    Number of Visits 12    Date for PT Re-Evaluation 04/10/21    Authorization Type BCBS - VL: 40    PT Start Time 0931    PT Stop Time 1016    PT Time Calculation (min) 45 min    Activity Tolerance Patient tolerated treatment well;Patient limited by pain    Behavior During Therapy [facility] for tasks assessed/performed             Past Medical History:  Diagnosis Date   Abnormal Pap smear of cervix    before children   Alkaline phosphatase deficiency    29 in 9/11   Allergic state 08/23/2015   Arthritis    hip   Breast cancer of lower-outer quadrant of right female breast ([place]) 12/20/2015   right breast   Eustachian tube dysfunction 02/2014   H/O recurrent pneumonia 08/23/2015   History of chicken pox 08/23/2015   History of radiation therapy 03/05/16- 04/02/16   Right Breast treated to 40.05 Gy in 15 fractions & Right Breast boost to 10 Gy in 5 fractions.    Low BP    Personal history of radiation therapy    2017 right breast   Pneumonia    RAD (reactive airway disease)     FORMER smoker , RAD with RTI's, cat or allergen exposure   Sacral pain 2014   trauma ( fall)    Past Surgical History:  Procedure Laterality Date   BREAST BIOPSY Right 12/19/2015   BREAST LUMPECTOMY Right 01/20/2016   Procedure: RIGHT BREAST LUMPECTOMY;  Surgeon: [name], MD;  Location: [facility];  Service: General;  Laterality: Right;   BREAST LUMPECTOMY WITH RADIOACTIVE SEED LOCALIZATION Right 01/10/2016   Procedure: BREAST LUMPECTOMY WITH RADIOACTIVE SEED LOCALIZATION;  Surgeon: [name], MD;   Location: [place];  Service: General;  Laterality: Right;   CERVICAL FUSION  07/30/2010   & Discectomy, Dr Vertell Limber ( @ 3 levels)/has plate and 6 screws   COLONOSCOPY  07/23/2017   Dr.Armbruster   POLYPECTOMY     SEPTOPLASTY  1983   for septal deviation   TONSILLECTOMY     uterine ablation  2008   Dr Ronita Hipps   WISDOM TOOTH EXTRACTION      There were no vitals filed for this visit.   Subjective Assessment - 03/13/21 0933     Subjective Pt reports good initial relief from the DN but increased soreness the next day. Pt reprorts she could barely walk on Monday after accidentally riding a camel on Sunday.    Patient Stated Goals "make it stop"    Currently in Pain? Yes    Pain Score 4     Pain Location Buttocks    Pain Orientation Left    Pain Descriptors / Indicators Sharp;Shooting    Pain Type Chronic pain    Pain Radiating Towards no sharp pain in leg at the moment but notes tightness in the calf    Pain Frequency Intermittent  [place] Adult PT Treatment/Exercise - 03/13/21 0931       Exercises   Exercises Lumbar      Lumbar Exercises: Stretches   Prone on Elbows Stretch 3 reps;30 seconds    Press Ups 5 reps;5 seconds    Gastroc Stretch Right;Left;2 reps;20 seconds      Lumbar Exercises: Aerobic   Recumbent Bike L2 x 6 min      Lumbar Exercises: Quadruped   Madcat/Old Horse 10 reps    Straight Leg Raise 10 reps;3 seconds    Opposite Arm/Leg Raise Right arm/Left leg;Left arm/Right leg;10 reps;3 seconds    Plank Elbows to toes 3 x 5"    Other Quadruped Lumbar Exercises R/L fire hydrants 10 x 3"                     PT Education - 03/13/21 1015     Education Details Posture and body mechanics education; HEP update - extension exercsies and quadruped - Access Code: [email or web address]    Person(s) Educated Patient    Methods Explanation;Demonstration;Verbal cues;Handout    Comprehension Verbalized  understanding;Verbal cues required;Returned demonstration;Need further instruction              PT Short Term Goals - 03/13/21 0937       PT SHORT TERM GOAL #1   Title Patient will be independent with initial HEP    Status Achieved   03/13/21     PT SHORT TERM GOAL #2   Title Patient will verbalize/demonstrate understanding of neutral spine posture and proper body mechanics to reduce strain on lumbar spine    Status On-going    Target Date 03/20/21               PT Long Term Goals - 03/04/21 0808       PT LONG TERM GOAL #1   Title Patient will be independent with ongoing/advanced HEP for self-management at home in order to build upon functional gains in therapy    Status On-going    Target Date 04/10/21      PT LONG TERM GOAL #2   Title Patient to demonstrate ability to achieve and maintain good spinal alignment/posturing with mobillity and daily tasks    Status On-going    Target Date 04/10/21      PT LONG TERM GOAL #3   Title Patient to improve lumbar AROM to WNL/WFL without pain provocation to allow for increased ease of lower body dressing    Status On-going    Target Date 04/10/21      PT LONG TERM GOAL #4   Title Patient will demonstrate improved proximal B LE strength to >/= 4+/5 for improved stability and ease of mobility    Status On-going    Target Date 04/10/21      PT LONG TERM GOAL #5   Title Patient to report ability to perform ADLs, household, and work-related tasks without limitation due to lumbar or L LE pain, LOM or weakness    Status On-going    Target Date 04/10/21                   Plan - 03/13/21 3557     Clinical Impression Statement Jesusa reports good initial relief from DN with some soreness following as expected. Pain exacerbated on Sunday by riding a camel but has eased down some since. Still feels limited in ROM and has pain with transitional movements like sit to stand or car transfers but  able to walk as long as she  wants w/o increased pain. Provided education in posture and body mechanics for typical daily activities to reduce muscle and lumbar strain with mobility - pt noting some she already utilizes some of the ideas presented but notes a few she would like to try. Given ongoing intermittent radiculopathy, introduced extension principle exercises explaining desire to create centralization of any radicular pain. Also progressed lumbopelvic flexibility/mobilization and strengthening with introduction of quadruped exercises which were well tolerated. Pt requesting home instructions so that she may work on them at home if unable to get an appointment for next week due to the holiday.    Comorbidities 3-level ACDF 2012, breast cancer s/p lumpectomy, RAD, asthma, IBS, urinary incontinence    Rehab Potential Good    PT Frequency 2x / week    PT Duration 6 weeks    PT Treatment/Interventions ADLs/Self Care Home Management;Cryotherapy;Electrical Stimulation;Iontophoresis 4mg /ml Dexamethasone;Moist Heat;Traction;Ultrasound;Functional mobility training;Therapeutic activities;Therapeutic exercise;Balance training;Neuromuscular re-education;Patient/family education;Manual techniques;Passive range of motion;Dry needling;Taping;Spinal Manipulations;Joint Manipulations    PT Next Visit Plan lumbopelvic strengthening/stabilization + potentially introduce extension based exercises; review & update HEP PRN;manual therapy including DN as indicated & benefit noted; modalities PRN    PT Home Exercise Plan Access Code: [email or web address] (11/3, updated 11/8 & 11/17)    Consulted and Agree with Plan of Care Patient             Patient will benefit from skilled therapeutic intervention in order to improve the following deficits and impairments:  Decreased activity tolerance, Decreased knowledge of precautions, Decreased mobility, Decreased range of motion, Decreased strength, Increased fascial restricitons, Increased muscle spasms, Impaired  perceived functional ability, Impaired flexibility, Improper body mechanics, Postural dysfunction, Pain  Visit Diagnosis: Sciatica, left side  Other muscle spasm  Muscle weakness (generalized)  Abnormal posture  Other abnormalities of gait and mobility     Problem List Patient Active Problem List   Diagnosis Date Noted   H/O adenomatous polyp of colon 08/24/2020   Hyperglycemia 08/24/2020   Snoring 08/23/2019   Insomnia 08/23/2019   COVID-19 05/31/2019   Food intolerance in adult 11/29/2018   Seasonal allergic conjunctivitis 11/29/2018   Allergic rhinitis 11/29/2018   History of asthma 11/29/2018   Intramural leiomyoma of uterus 02/06/2018   Reactive airways dysfunction syndrome ([place]) 07/20/2016   Genetic testing 02/24/2016   Family history of colon cancer 12/27/2015   Malignant neoplasm of lower-outer quadrant of right breast of female, estrogen receptor negative ([place]) 12/20/2015   Preventative health care 08/23/2015   Perimenopause 08/23/2015   History of chicken pox 08/23/2015   H/O recurrent pneumonia 08/23/2015   Allergic state 08/23/2015   Alkaline phosphatase deficiency 11/10/2012   RAD (reactive airway disease) 05/25/2012   Other irritable bowel syndrome 10/15/2008    Percival Spanish, PT 03/13/2021, 10:32 AM  [facility] [address]  [place], [place], [address] Phone: [phone]   Fax:  [phone]  Name: ALENCIA GORDON MRN: [id number] Date of Birth: 06/30/1965

## 2021-03-18 ENCOUNTER — Other Ambulatory Visit: Payer: Self-pay

## 2021-03-18 ENCOUNTER — Ambulatory Visit: Payer: BLUE CROSS/BLUE SHIELD

## 2021-03-18 DIAGNOSIS — M5432 Sciatica, left side: Secondary | ICD-10-CM | POA: Diagnosis not present

## 2021-03-18 DIAGNOSIS — M6281 Muscle weakness (generalized): Secondary | ICD-10-CM

## 2021-03-18 DIAGNOSIS — R293 Abnormal posture: Secondary | ICD-10-CM

## 2021-03-18 DIAGNOSIS — M62838 Other muscle spasm: Secondary | ICD-10-CM

## 2021-03-18 DIAGNOSIS — R2689 Other abnormalities of gait and mobility: Secondary | ICD-10-CM

## 2021-03-18 NOTE — Therapy (Signed)
[facility] [address]  [place], [place], [address] Phone: [phone]   Fax:  [phone]  Physical Therapy Treatment  Patient Details  Name: Barbara Fuentes MRN: [id number] Date of Birth: [birth year] Referring Provider (PT): [name], MD   Encounter Date: 03/18/2021   PT End of Session - 03/18/21 1812     Visit Number 4    Number of Visits 12    Date for PT Re-Evaluation 04/10/21    Authorization Type BCBS - VL: 40    PT Start Time 4332    PT Stop Time 1700    PT Time Calculation (min) 43 min    Activity Tolerance Patient tolerated treatment well    Behavior During Therapy [facility] for tasks assessed/performed             Past Medical History:  Diagnosis Date   Abnormal Pap smear of cervix    before children   Alkaline phosphatase deficiency    29 in 9/11   Allergic state 08/23/2015   Arthritis    hip   Breast cancer of lower-outer quadrant of right female breast ([place]) 12/20/2015   right breast   Eustachian tube dysfunction 02/2014   H/O recurrent pneumonia 08/23/2015   History of chicken pox 08/23/2015   History of radiation therapy 03/05/16- 04/02/16   Right Breast treated to 40.05 Gy in 15 fractions & Right Breast boost to 10 Gy in 5 fractions.    Low BP    Personal history of radiation therapy    2017 right breast   Pneumonia    RAD (reactive airway disease)     FORMER smoker , RAD with RTI's, cat or allergen exposure   Sacral pain 2014   trauma ( fall)    Past Surgical History:  Procedure Laterality Date   BREAST BIOPSY Right 12/19/2015   BREAST LUMPECTOMY Right 01/20/2016   Procedure: RIGHT BREAST LUMPECTOMY;  Surgeon: [name], MD;  Location: [facility];  Service: General;  Laterality: Right;   BREAST LUMPECTOMY WITH RADIOACTIVE SEED LOCALIZATION Right 01/10/2016   Procedure: BREAST LUMPECTOMY WITH RADIOACTIVE SEED LOCALIZATION;  Surgeon: [name], MD;  Location: [facility];  Service: General;  Laterality: Right;   CERVICAL FUSION  07/30/2010   & Discectomy, Dr Vertell Limber ( @ 3 levels)/has plate and 6 screws   COLONOSCOPY  07/23/2017   Dr.Armbruster   POLYPECTOMY     SEPTOPLASTY  1983   for septal deviation   TONSILLECTOMY     uterine ablation  2008   Dr Ronita Hipps   WISDOM TOOTH EXTRACTION      There were no vitals filed for this visit.   Subjective Assessment - 03/18/21 1625     Subjective Pt notes pain still when sitting for periods, going for car ride tomorrow to see family.    Diagnostic tests none    Patient Stated Goals "make it stop"    Currently in Pain? Yes    Pain Score 4     Pain Location Buttocks    Pain Orientation Left    Pain Descriptors / Indicators Sharp;Shooting    Pain Type Chronic pain                               [facility] Adult PT Treatment/Exercise - 03/18/21 0001       Lumbar Exercises: Stretches   Press Ups 3 reps;20 seconds  Figure 4 Stretch 2 reps;20 seconds;Seated;With overpressure    Other Lumbar Stretch Exercise childs pose stretch 2x30 sec; fwd flexion stretch with green Pball 2x30 sec      Lumbar Exercises: Aerobic   Recumbent Bike L2 x 6 min      Lumbar Exercises: Machines for Strengthening   Other Lumbar Machine Exercise standing lat pull 10 lb 2x10      Lumbar Exercises: Seated   Sit to Stand 10 reps    Sit to Stand Limitations with    Other Seated Lumbar Exercises CW/CCW circles using trunk with green Pball 10x      Lumbar Exercises: Quadruped   Opposite Arm/Leg Raise Right arm/Left leg;Left arm/Right leg;10 reps    Plank 2x20"                       PT Short Term Goals - 03/18/21 1817       PT SHORT TERM GOAL #1   Title Patient will be independent with initial HEP    Status Achieved   03/13/21     PT SHORT TERM GOAL #2   Title Patient will verbalize/demonstrate understanding of neutral spine posture and proper body mechanics to reduce strain on lumbar spine     Status Achieved   03/18/21   Target Date 03/20/21               PT Long Term Goals - 03/04/21 0808       PT LONG TERM GOAL #1   Title Patient will be independent with ongoing/advanced HEP for self-management at home in order to build upon functional gains in therapy    Status On-going    Target Date 04/10/21      PT LONG TERM GOAL #2   Title Patient to demonstrate ability to achieve and maintain good spinal alignment/posturing with mobillity and daily tasks    Status On-going    Target Date 04/10/21      PT LONG TERM GOAL #3   Title Patient to improve lumbar AROM to WNL/WFL without pain provocation to allow for increased ease of lower body dressing    Status On-going    Target Date 04/10/21      PT LONG TERM GOAL #4   Title Patient will demonstrate improved proximal B LE strength to >/= 4+/5 for improved stability and ease of mobility    Status On-going    Target Date 04/10/21      PT LONG TERM GOAL #5   Title Patient to report ability to perform ADLs, household, and work-related tasks without limitation due to lumbar or L LE pain, LOM or weakness    Status On-going    Target Date 04/10/21                   Plan - 03/18/21 1659     Clinical Impression Statement Pt well tolerated exercises today with no reports of pain. Continued working on extension based exercises today as she notes good relief from this. Some cues needed to keep nuetral spine with exercises. I showed her some seated versions of stretches that she is doing in supine to reduce pain during her car ride tomorrow. She had a good response and able to progress exercises well meeting LTG 2 today, showing good postural alignment and body mechanics.    Personal Factors and Comorbidities Time since onset of injury/illness/exacerbation;Past/Current Experience;Comorbidity 3+;Profession    Comorbidities 3-level ACDF 2012, breast cancer s/p lumpectomy, RAD, asthma,  IBS, urinary incontinence    Rehab  Potential Good    PT Frequency 2x / week    PT Duration 6 weeks    PT Treatment/Interventions ADLs/Self Care Home Management;Cryotherapy;Electrical Stimulation;Iontophoresis 4mg /ml Dexamethasone;Moist Heat;Traction;Ultrasound;Functional mobility training;Therapeutic activities;Therapeutic exercise;Balance training;Neuromuscular re-education;Patient/family education;Manual techniques;Passive range of motion;Dry needling;Taping;Spinal Manipulations;Joint Manipulations    PT Next Visit Plan lumbopelvic strengthening/stabilization + potentially introduce extension based exercises; review & update HEP PRN;manual therapy including DN as indicated & benefit noted; modalities PRN    PT Home Exercise Plan Access Code: [email or web address] (11/3, updated 11/8 & 11/17)    Consulted and Agree with Plan of Care Patient             Patient will benefit from skilled therapeutic intervention in order to improve the following deficits and impairments:  Decreased activity tolerance, Decreased knowledge of precautions, Decreased mobility, Decreased range of motion, Decreased strength, Increased fascial restricitons, Increased muscle spasms, Impaired perceived functional ability, Impaired flexibility, Improper body mechanics, Postural dysfunction, Pain  Visit Diagnosis: Sciatica, left side  Other muscle spasm  Muscle weakness (generalized)  Abnormal posture  Other abnormalities of gait and mobility     Problem List Patient Active Problem List   Diagnosis Date Noted   H/O adenomatous polyp of colon 08/24/2020   Hyperglycemia 08/24/2020   Snoring 08/23/2019   Insomnia 08/23/2019   COVID-19 05/31/2019   Food intolerance in adult 11/29/2018   Seasonal allergic conjunctivitis 11/29/2018   Allergic rhinitis 11/29/2018   History of asthma 11/29/2018   Intramural leiomyoma of uterus 02/06/2018   Reactive airways dysfunction syndrome ([place]) 07/20/2016   Genetic testing 02/24/2016   Family history of colon cancer  12/27/2015   Malignant neoplasm of lower-outer quadrant of right breast of female, estrogen receptor negative ([place]) 12/20/2015   Preventative health care 08/23/2015   Perimenopause 08/23/2015   History of chicken pox 08/23/2015   H/O recurrent pneumonia 08/23/2015   Allergic state 08/23/2015   Alkaline phosphatase deficiency 11/10/2012   RAD (reactive airway disease) 05/25/2012   Other irritable bowel syndrome 10/15/2008    Artist Pais, PTA 03/18/2021, 6:20 PM  [facility] [address]  [place], [place], [address] Phone: [phone]   Fax:  [phone]  Name: YANCI BACHTELL MRN: [id number] Date of Birth: [birth year]

## 2021-03-25 ENCOUNTER — Ambulatory Visit: Payer: BLUE CROSS/BLUE SHIELD

## 2021-03-25 ENCOUNTER — Other Ambulatory Visit: Payer: Self-pay

## 2021-03-25 DIAGNOSIS — M5432 Sciatica, left side: Secondary | ICD-10-CM

## 2021-03-25 DIAGNOSIS — R293 Abnormal posture: Secondary | ICD-10-CM

## 2021-03-25 DIAGNOSIS — R2689 Other abnormalities of gait and mobility: Secondary | ICD-10-CM

## 2021-03-25 DIAGNOSIS — M6281 Muscle weakness (generalized): Secondary | ICD-10-CM

## 2021-03-25 DIAGNOSIS — M62838 Other muscle spasm: Secondary | ICD-10-CM

## 2021-03-25 NOTE — Therapy (Signed)
[facility] [address]  [place], [place], [address] Phone: [phone]   Fax:  [phone]  Physical Therapy Treatment  Patient Details  Name: Barbara Fuentes MRN: [id number] Date of Birth: [birth year] Referring Provider (PT): [name], MD   Encounter Date: 03/25/2021   PT End of Session - 03/25/21 1203     Visit Number 5    Number of Visits 12    Date for PT Re-Evaluation 04/10/21    Authorization Type BCBS - VL: 66    PT Start Time 1103    PT Stop Time 1141    PT Time Calculation (min) 38 min    Activity Tolerance Patient tolerated treatment well    Behavior During Therapy [facility] for tasks assessed/performed             Past Medical History:  Diagnosis Date   Abnormal Pap smear of cervix    before children   Alkaline phosphatase deficiency    29 in 9/11   Allergic state 08/23/2015   Arthritis    hip   Breast cancer of lower-outer quadrant of right female breast ([place]) 12/20/2015   right breast   Eustachian tube dysfunction 02/2014   H/O recurrent pneumonia 08/23/2015   History of chicken pox 08/23/2015   History of radiation therapy 03/05/16- 04/02/16   Right Breast treated to 40.05 Gy in 15 fractions & Right Breast boost to 10 Gy in 5 fractions.    Low BP    Personal history of radiation therapy    2017 right breast   Pneumonia    RAD (reactive airway disease)     FORMER smoker , RAD with RTI's, cat or allergen exposure   Sacral pain 2014   trauma ( fall)    Past Surgical History:  Procedure Laterality Date   BREAST BIOPSY Right 12/19/2015   BREAST LUMPECTOMY Right 01/20/2016   Procedure: RIGHT BREAST LUMPECTOMY;  Surgeon: [name], MD;  [facility];  Service: General;  Laterality: Right;   BREAST LUMPECTOMY WITH RADIOACTIVE SEED LOCALIZATION Right 01/10/2016   Procedure: BREAST LUMPECTOMY WITH RADIOACTIVE SEED LOCALIZATION;  Surgeon: [name], MD;  Location: [facility];  Service: General;  Laterality: Right;   CERVICAL FUSION  07/30/2010   & Discectomy, Dr Vertell Limber ( @ 3 levels)/has plate and 6 screws   COLONOSCOPY  07/23/2017   Dr.Armbruster   POLYPECTOMY     SEPTOPLASTY  1983   for septal deviation   TONSILLECTOMY     uterine ablation  2008   Dr Ronita Hipps   WISDOM TOOTH EXTRACTION      There were no vitals filed for this visit.   Subjective Assessment - 03/25/21 1106     Subjective Feels that "pinched nerve pain" today running down her L leg.    Diagnostic tests none    Patient Stated Goals "make it stop"    Currently in Pain? Yes    Pain Score 5     Pain Location Leg    Pain Orientation Left    Pain Descriptors / Indicators Stabbing    Pain Type Acute pain                               [facility] Adult PT Treatment/Exercise - 03/25/21 0001       Exercises   Exercises Lumbar      Lumbar Exercises: Stretches  Passive Hamstring Stretch Left;2 reps;30 seconds    Passive Hamstring Stretch Limitations hookying with strap    Lower Trunk Rotation Limitations 10x5"    Press Ups 5 reps;10 reps      Lumbar Exercises: Aerobic   Recumbent Bike L2 x 5 min      Lumbar Exercises: Seated   Other Seated Lumbar Exercises flexion stretch with green Pball 10x5"      Lumbar Exercises: Supine   Bridge 10 reps;3 seconds    Bridge Limitations with ball squeeze    Other Supine Lumbar Exercises PPTwith ball squeezes 10x5"      Lumbar Exercises: Prone   Straight Leg Raise 10 reps;2 seconds      Modalities   Modalities --                       PT Short Term Goals - 03/18/21 1817       PT SHORT TERM GOAL #1   Title Patient will be independent with initial HEP    Status Achieved   03/13/21     PT SHORT TERM GOAL #2   Title Patient will verbalize/demonstrate understanding of neutral spine posture and proper body mechanics to reduce strain on lumbar spine    Status Achieved   03/18/21   Target Date 03/20/21                PT Long Term Goals - 03/04/21 0808       PT LONG TERM GOAL #1   Title Patient will be independent with ongoing/advanced HEP for self-management at home in order to build upon functional gains in therapy    Status On-going    Target Date 04/10/21      PT LONG TERM GOAL #2   Title Patient to demonstrate ability to achieve and maintain good spinal alignment/posturing with mobillity and daily tasks    Status On-going    Target Date 04/10/21      PT LONG TERM GOAL #3   Title Patient to improve lumbar AROM to WNL/WFL without pain provocation to allow for increased ease of lower body dressing    Status On-going    Target Date 04/10/21      PT LONG TERM GOAL #4   Title Patient will demonstrate improved proximal B LE strength to >/= 4+/5 for improved stability and ease of mobility    Status On-going    Target Date 04/10/21      PT LONG TERM GOAL #5   Title Patient to report ability to perform ADLs, household, and work-related tasks without limitation due to lumbar or L LE pain, LOM or weakness    Status On-going    Target Date 04/10/21                   Plan - 03/25/21 1152     Clinical Impression Statement Pt came in today flared up from long car ride from [place]. She noted that she felt some pinched nerve pain at the start of the session but as we progressed with exercises her pain decreased to a 2/10. Most of the session was focused on stretches and light mobility exercises to decrease pain. Gave her a handout talking about a TENS unit for pain relief, explained to her the rationale for use and contraindications. Plan to try estim next session to decrease LBP.    Personal Factors and Comorbidities Time since onset of injury/illness/exacerbation;Past/Current Experience;Comorbidity 3+;Profession    Comorbidities 3-level ACDF  2012, breast cancer s/p lumpectomy, RAD, asthma, IBS, urinary incontinence    PT Frequency 2x / week    PT Duration 6 weeks     PT Treatment/Interventions ADLs/Self Care Home Management;Cryotherapy;Electrical Stimulation;Iontophoresis 4mg /ml Dexamethasone;Moist Heat;Traction;Ultrasound;Functional mobility training;Therapeutic activities;Therapeutic exercise;Balance training;Neuromuscular re-education;Patient/family education;Manual techniques;Passive range of motion;Dry needling;Taping;Spinal Manipulations;Joint Manipulations    PT Next Visit Plan esti, review TENS info; lumbopelvic strengthening/stabilization + potentially introduce extension based exercises; review & update HEP PRN;manual therapy including DN as indicated & benefit noted; modalities PRN    PT Home Exercise Plan Access Code: [email or web address] (11/3, updated 11/8 & 11/17)    Consulted and Agree with Plan of Care Patient             Patient will benefit from skilled therapeutic intervention in order to improve the following deficits and impairments:  Decreased activity tolerance, Decreased knowledge of precautions, Decreased mobility, Decreased range of motion, Decreased strength, Increased fascial restricitons, Increased muscle spasms, Impaired perceived functional ability, Impaired flexibility, Improper body mechanics, Postural dysfunction, Pain  Visit Diagnosis: Sciatica, left side  Other muscle spasm  Muscle weakness (generalized)  Abnormal posture  Other abnormalities of gait and mobility     Problem List Patient Active Problem List   Diagnosis Date Noted   H/O adenomatous polyp of colon 08/24/2020   Hyperglycemia 08/24/2020   Snoring 08/23/2019   Insomnia 08/23/2019   COVID-19 05/31/2019   Food intolerance in adult 11/29/2018   Seasonal allergic conjunctivitis 11/29/2018   Allergic rhinitis 11/29/2018   History of asthma 11/29/2018   Intramural leiomyoma of uterus 02/06/2018   Reactive airways dysfunction syndrome ([place]) 07/20/2016   Genetic testing 02/24/2016   Family history of colon cancer 12/27/2015   Malignant neoplasm of  lower-outer quadrant of right breast of female, estrogen receptor negative ([place]) 12/20/2015   Preventative health care 08/23/2015   Perimenopause 08/23/2015   History of chicken pox 08/23/2015   H/O recurrent pneumonia 08/23/2015   Allergic state 08/23/2015   Alkaline phosphatase deficiency 11/10/2012   RAD (reactive airway disease) 05/25/2012   Other irritable bowel syndrome 10/15/2008    Artist Pais, PTA 03/25/2021, 12:05 PM  [facility] [address]  [place], [place], [address] Phone: [phone]   Fax:  [phone]  Name: Barbara Fuentes MRN: [id number] Date of Birth: 09/19/1965

## 2021-03-27 ENCOUNTER — Encounter: Payer: BLUE CROSS/BLUE SHIELD | Admitting: Physical Therapy

## 2021-03-31 ENCOUNTER — Encounter: Payer: Self-pay | Admitting: Physical Therapy

## 2021-03-31 ENCOUNTER — Encounter: Payer: Self-pay | Admitting: Family Medicine

## 2021-03-31 ENCOUNTER — Other Ambulatory Visit: Payer: Self-pay

## 2021-03-31 ENCOUNTER — Ambulatory Visit: Payer: BLUE CROSS/BLUE SHIELD | Attending: Family Medicine | Admitting: Physical Therapy

## 2021-03-31 DIAGNOSIS — M6281 Muscle weakness (generalized): Secondary | ICD-10-CM | POA: Diagnosis present

## 2021-03-31 DIAGNOSIS — R293 Abnormal posture: Secondary | ICD-10-CM | POA: Diagnosis present

## 2021-03-31 DIAGNOSIS — R2689 Other abnormalities of gait and mobility: Secondary | ICD-10-CM | POA: Diagnosis present

## 2021-03-31 DIAGNOSIS — M5432 Sciatica, left side: Secondary | ICD-10-CM

## 2021-03-31 DIAGNOSIS — M62838 Other muscle spasm: Secondary | ICD-10-CM | POA: Diagnosis present

## 2021-03-31 NOTE — Therapy (Signed)
[facility] [address]  [place], [place], [address] Phone: [phone]   Fax:  [phone]  Physical Therapy Treatment  Patient Details  Name: Barbara Fuentes MRN: [id number] Date of Birth: [birth year] Referring Provider (PT): [name], MD   Encounter Date: 03/31/2021   PT End of Session - 03/31/21 0806     Visit Number 6    Number of Visits 12    Date for PT Re-Evaluation 04/10/21    Authorization Type BCBS - VL: 75    PT Start Time 0846    PT Stop Time 0945    PT Time Calculation (min) 59 min    Activity Tolerance Patient tolerated treatment well    Behavior During Therapy [facility] for tasks assessed/performed             Past Medical History:  Diagnosis Date   Abnormal Pap smear of cervix    before children   Alkaline phosphatase deficiency    29 in 9/11   Allergic state 08/23/2015   Arthritis    hip   Breast cancer of lower-outer quadrant of right female breast ([place]) 12/20/2015   right breast   Eustachian tube dysfunction 02/2014   H/O recurrent pneumonia 08/23/2015   History of chicken pox 08/23/2015   History of radiation therapy 03/05/16- 04/02/16   Right Breast treated to 40.05 Gy in 15 fractions & Right Breast boost to 10 Gy in 5 fractions.    Low BP    Personal history of radiation therapy    2017 right breast   Pneumonia    RAD (reactive airway disease)     FORMER smoker , RAD with RTI's, cat or allergen exposure   Sacral pain 2014   trauma ( fall)    Past Surgical History:  Procedure Laterality Date   BREAST BIOPSY Right 12/19/2015   BREAST LUMPECTOMY Right 01/20/2016   Procedure: RIGHT BREAST LUMPECTOMY;  Surgeon: [name], MD;  Location: [facility];  Service: General;  Laterality: Right;   BREAST LUMPECTOMY WITH RADIOACTIVE SEED LOCALIZATION Right 01/10/2016   Procedure: BREAST LUMPECTOMY WITH RADIOACTIVE SEED LOCALIZATION;  Surgeon: [name], MD;  Location: [place];  Service: General;  Laterality: Right;   CERVICAL FUSION  07/30/2010   & Discectomy, Dr Vertell Limber ( @ 3 levels)/has plate and 6 screws   COLONOSCOPY  07/23/2017   Dr.Armbruster   POLYPECTOMY     SEPTOPLASTY  1983   for septal deviation   TONSILLECTOMY     uterine ablation  2008   Dr Ronita Hipps   WISDOM TOOTH EXTRACTION      There were no vitals filed for this visit.   Subjective Assessment - 03/31/21 0851     Subjective Pt reports she had a bad weekend - fine when standing up or walkng but increased pain when bending over or with other movements, often unpredictable. Stretch helps briefly but finds that she is usually in more pain the day following PT. Feels that things are not improving and is considering reaching out to MD re: proceeding with imaging.    Diagnostic tests none    Patient Stated Goals "make it stop"    Currently in Pain? Yes    Pain Score 5     Pain Location Leg    Pain Orientation Left    Pain Descriptors / Indicators Stabbing;Shooting    Pain Type Acute pain    Pain Frequency Intermittent  [facility] Adult PT Treatment/Exercise - 03/31/21 0846       Lumbar Exercises: Aerobic   Tread Mill 3.0 mph x 6 min      Modalities   Modalities Electrical Stimulation;Moist Heat      Moist Heat Therapy   Number Minutes Moist Heat 15 Minutes    Moist Heat Location Lumbar Spine      Electrical Stimulation   Electrical Stimulation Location Lumbosacral paraspinals    Electrical Stimulation Action IFC    Electrical Stimulation Parameters 80-150 Hz, intensity to pt tol x 15"    Electrical Stimulation Goals Pain;Tone      Manual Therapy   Manual Therapy Soft tissue mobilization;Myofascial release;Other (comment)    Manual therapy comments skilled palpation and monitoring during DN    Soft tissue mobilization STM/DTM to B lumbosacral paraspinals, L glutes & piriformis    Myofascial Release manual TPR to medial L glute  medius & minimus              Trigger Point Dry Needling - 03/31/21 0846     Consent Given? Yes    Muscles Treated Back/Hip Lumbar multifidi;Erector spinae;Gluteus medius;Gluteus minimus    Electrical Stimulation Performed with Dry Needling Yes    E-stim with Dry Needling Details Lumbar multifidi    Gluteus Minimus Response Twitch response elicited;Palpable increased muscle length   Lt   Gluteus Medius Response Twitch response elicited;Palpable increased muscle length   Lt   Erector spinae Response Twitch response elicited;Palpable increased muscle length   Bil   Lumbar multifidi Response Twitch response elicited;Palpable increased muscle length   Bil                    PT Short Term Goals - 03/18/21 1817       PT SHORT TERM GOAL #1   Title Patient will be independent with initial HEP    Status Achieved   03/13/21     PT SHORT TERM GOAL #2   Title Patient will verbalize/demonstrate understanding of neutral spine posture and proper body mechanics to reduce strain on lumbar spine    Status Achieved   03/18/21   Target Date 03/20/21               PT Long Term Goals - 03/04/21 0808       PT LONG TERM GOAL #1   Title Patient will be independent with ongoing/advanced HEP for self-management at home in order to build upon functional gains in therapy    Status On-going    Target Date 04/10/21      PT LONG TERM GOAL #2   Title Patient to demonstrate ability to achieve and maintain good spinal alignment/posturing with mobillity and daily tasks    Status On-going    Target Date 04/10/21      PT LONG TERM GOAL #3   Title Patient to improve lumbar AROM to WNL/WFL without pain provocation to allow for increased ease of lower body dressing    Status On-going    Target Date 04/10/21      PT LONG TERM GOAL #4   Title Patient will demonstrate improved proximal B LE strength to >/= 4+/5 for improved stability and ease of mobility    Status On-going    Target Date  04/10/21      PT LONG TERM GOAL #5   Title Patient to report ability to perform ADLs, household, and work-related tasks without limitation due to lumbar or L LE pain, LOM  or weakness    Status On-going    Target Date 04/10/21                   Plan - 03/31/21 0930     Clinical Impression Statement Barbara Fuentes arrives to PT frustrated that her back and LE radicular pain does not seem to be improving and now feels more reminiscent of the "nervy" type of pain she experienced prior to her neck surgery. She continues to report intermittent pain with bending over and sit to stand and car transfers as well as other unpredictable movements. She was unable to use the NuStep for her warm-up today due to pain but was able to walk on the TM w/o limitation d/t pain. Increased muscle tension persisting in her B lumbosacral paraspinals as well as L upper medial glutes - she notes some relief with self-STM using tennis ball on wall at home but unable to achieve full resolution of pain. Manual therapy today addressed ongoing pain and muscle tension incorporating DN with and w/o estim via needles followed by IFC estim and moist heat to promote further muscle relaxation. Pt noting improved pain by end of session and messaged PT upon return home noting no pain with driving or car transfers.    Comorbidities 3-level ACDF 2012, breast cancer s/p lumpectomy, RAD, asthma, IBS, urinary incontinence    Rehab Potential Good    PT Frequency 2x / week    PT Duration 6 weeks    PT Treatment/Interventions ADLs/Self Care Home Management;Cryotherapy;Electrical Stimulation;Iontophoresis 4mg /ml Dexamethasone;Moist Heat;Traction;Ultrasound;Functional mobility training;Therapeutic activities;Therapeutic exercise;Balance training;Neuromuscular re-education;Patient/family education;Manual techniques;Passive range of motion;Dry needling;Taping;Spinal Manipulations;Joint Manipulations    PT Next Visit Plan estim with exercises as needed  for pain; lumbopelvic strengthening/stabilization + progress extension based exercises; review & update HEP PRN; manual therapy including DN as indicated & benefit noted; modalities PRN    PT Home Exercise Plan Access Code: [email or web address] (11/3, updated 11/8 & 11/17)    Consulted and Agree with Plan of Care Patient             Patient will benefit from skilled therapeutic intervention in order to improve the following deficits and impairments:  Decreased activity tolerance, Decreased knowledge of precautions, Decreased mobility, Decreased range of motion, Decreased strength, Increased fascial restricitons, Increased muscle spasms, Impaired perceived functional ability, Impaired flexibility, Improper body mechanics, Postural dysfunction, Pain  Visit Diagnosis: Sciatica, left side  Other muscle spasm  Muscle weakness (generalized)  Abnormal posture  Other abnormalities of gait and mobility     Problem List Patient Active Problem List   Diagnosis Date Noted   H/O adenomatous polyp of colon 08/24/2020   Hyperglycemia 08/24/2020   Snoring 08/23/2019   Insomnia 08/23/2019   COVID-19 05/31/2019   Food intolerance in adult 11/29/2018   Seasonal allergic conjunctivitis 11/29/2018   Allergic rhinitis 11/29/2018   History of asthma 11/29/2018   Intramural leiomyoma of uterus 02/06/2018   Reactive airways dysfunction syndrome ([place]) 07/20/2016   Genetic testing 02/24/2016   Family history of colon cancer 12/27/2015   Malignant neoplasm of lower-outer quadrant of right breast of female, estrogen receptor negative ([place]) 12/20/2015   Preventative health care 08/23/2015   Perimenopause 08/23/2015   History of chicken pox 08/23/2015   H/O recurrent pneumonia 08/23/2015   Allergic state 08/23/2015   Alkaline phosphatase deficiency 11/10/2012   RAD (reactive airway disease) 05/25/2012   Other irritable bowel syndrome 10/15/2008    Percival Spanish, PT 03/31/2021, 11:57 AM  [facility] [address]  [place], [place], [address] Phone: [phone]   Fax:  [phone]  Name: Barbara Fuentes MRN: [id number] Date of Birth: 1965-07-23

## 2021-04-02 ENCOUNTER — Other Ambulatory Visit: Payer: Self-pay

## 2021-04-02 ENCOUNTER — Ambulatory Visit: Payer: BLUE CROSS/BLUE SHIELD

## 2021-04-02 DIAGNOSIS — M6281 Muscle weakness (generalized): Secondary | ICD-10-CM

## 2021-04-02 DIAGNOSIS — M5432 Sciatica, left side: Secondary | ICD-10-CM

## 2021-04-02 DIAGNOSIS — R293 Abnormal posture: Secondary | ICD-10-CM

## 2021-04-02 DIAGNOSIS — R2689 Other abnormalities of gait and mobility: Secondary | ICD-10-CM

## 2021-04-02 DIAGNOSIS — M62838 Other muscle spasm: Secondary | ICD-10-CM

## 2021-04-02 NOTE — Therapy (Addendum)
[facility] [address]  [place], [place], [address] Phone: [phone]   Fax:  [phone]  Physical Therapy Treatment / Discharge Summary  Patient Details  Name: Barbara Fuentes MRN: [id number] Date of Birth: 07-23-1965 Referring Provider (PT): [name], MD   Encounter Date: 04/02/2021   PT End of Session - 04/02/21 1102     Visit Number 7    Number of Visits 12    Date for PT Re-Evaluation 04/10/21    Authorization Type BCBS - VL: 40    PT Start Time 7893    PT Stop Time 1112    PT Time Calculation (min) 57 min    Activity Tolerance Patient tolerated treatment well    Behavior During Therapy [facility] for tasks assessed/performed             Past Medical History:  Diagnosis Date   Abnormal Pap smear of cervix    before children   Alkaline phosphatase deficiency    29 in 9/11   Allergic state 08/23/2015   Arthritis    hip   Breast cancer of lower-outer quadrant of right female breast ([place]) 12/20/2015   right breast   Eustachian tube dysfunction 02/2014   H/O recurrent pneumonia 08/23/2015   History of chicken pox 08/23/2015   History of radiation therapy 03/05/16- 04/02/16   Right Breast treated to 40.05 Gy in 15 fractions & Right Breast boost to 10 Gy in 5 fractions.    Low BP    Personal history of radiation therapy    2017 right breast   Pneumonia    RAD (reactive airway disease)     FORMER smoker , RAD with RTI's, cat or allergen exposure   Sacral pain 2014   trauma ( fall)    Past Surgical History:  Procedure Laterality Date   BREAST BIOPSY Right 12/19/2015   BREAST LUMPECTOMY Right 01/20/2016   Procedure: RIGHT BREAST LUMPECTOMY;  Surgeon: [name], MD;  Location: [facility];  Service: General;  Laterality: Right;   BREAST LUMPECTOMY WITH RADIOACTIVE SEED LOCALIZATION Right 01/10/2016   Procedure: BREAST LUMPECTOMY WITH RADIOACTIVE SEED LOCALIZATION;  Surgeon: [name], MD;  Location:  [place];  Service: General;  Laterality: Right;   CERVICAL FUSION  07/30/2010   & Discectomy, Dr Vertell Limber ( @ 3 levels)/has plate and 6 screws   COLONOSCOPY  07/23/2017   Dr.Armbruster   POLYPECTOMY     SEPTOPLASTY  1983   for septal deviation   TONSILLECTOMY     uterine ablation  2008   Dr Ronita Hipps   WISDOM TOOTH EXTRACTION      There were no vitals filed for this visit.   Subjective Assessment - 04/02/21 1015     Subjective Pt reports that she was able to get out of the car after therapy the other day w/o pain.    Diagnostic tests none    Patient Stated Goals "make it stop"    Currently in Pain? No/denies   soreness in the L low back                              [facility] Adult PT Treatment/Exercise - 04/02/21 0001       Lumbar Exercises: Aerobic   Recumbent Bike L2x31mn      Lumbar Exercises: Supine   Bent Knee Raise 10 reps;3 seconds    Bent Knee Raise Limitations  with abd brace    Other Supine Lumbar Exercises arm raises with abd brace 10x3"      Modalities   Modalities Electrical Stimulation;Moist Heat      Moist Heat Therapy   Number Minutes Moist Heat 12 Minutes    Moist Heat Location Lumbar Spine      Electrical Stimulation   Electrical Stimulation Location Lumbosacral paraspinals    Electrical Stimulation Action IFC    Electrical Stimulation Parameters 80-150 Hz, intersity to tolerance (15)    Electrical Stimulation Goals Pain;Tone      Manual Therapy   Manual Therapy Soft tissue mobilization;Myofascial release    Soft tissue mobilization STM/DTM to L lumbosacral paraspinals, glutes & piriformis    Myofascial Release manual TPR to L glute medius & minimus,                     PT Education - 04/02/21 1218     Education Details HEP update: Access Code: [email or web address]    Person(s) Educated Patient    Methods Explanation;Demonstration;Handout    Comprehension Verbalized understanding;Returned demonstration               PT Short Term Goals - 03/18/21 1817       PT SHORT TERM GOAL #1   Title Patient will be independent with initial HEP    Status Achieved   03/13/21     PT SHORT TERM GOAL #2   Title Patient will verbalize/demonstrate understanding of neutral spine posture and proper body mechanics to reduce strain on lumbar spine    Status Achieved   03/18/21   Target Date 03/20/21               PT Long Term Goals - 03/04/21 0808       PT LONG TERM GOAL #1   Title Patient will be independent with ongoing/advanced HEP for self-management at home in order to build upon functional gains in therapy    Status On-going    Target Date 04/10/21      PT LONG TERM GOAL #2   Title Patient to demonstrate ability to achieve and maintain good spinal alignment/posturing with mobillity and daily tasks    Status On-going    Target Date 04/10/21      PT LONG TERM GOAL #3   Title Patient to improve lumbar AROM to WNL/WFL without pain provocation to allow for increased ease of lower body dressing    Status On-going    Target Date 04/10/21      PT LONG TERM GOAL #4   Title Patient will demonstrate improved proximal B LE strength to >/= 4+/5 for improved stability and ease of mobility    Status On-going    Target Date 04/10/21      PT LONG TERM GOAL #5   Title Patient to report ability to perform ADLs, household, and work-related tasks without limitation due to lumbar or L LE pain, LOM or weakness    Status On-going    Target Date 04/10/21                   Plan - 04/02/21 1103     Clinical Impression Statement Due to pt reporting resolution of LBP after last session, we continued working on releasing myofascial restrictions in the lower back and glute musculature. Most TTP still noted in L medial glute med/min and lumbar paraspinals. Did some core stab exercises in supine, gave cues to keep neutral spine and advised pt  to start with the UEs and LE isolated, then progress to  simultaneous movement. Ended session with estim and heat for pain relief and to reduce muscle tightness.    Personal Factors and Comorbidities Time since onset of injury/illness/exacerbation;Past/Current Experience;Comorbidity 3+;Profession    Comorbidities 3-level ACDF 2012, breast cancer s/p lumpectomy, RAD, asthma, IBS, urinary incontinence    PT Frequency 2x / week    PT Duration 6 weeks    PT Treatment/Interventions ADLs/Self Care Home Management;Cryotherapy;Electrical Stimulation;Iontophoresis 31m/ml Dexamethasone;Moist Heat;Traction;Ultrasound;Functional mobility training;Therapeutic activities;Therapeutic exercise;Balance training;Neuromuscular re-education;Patient/family education;Manual techniques;Passive range of motion;Dry needling;Taping;Spinal Manipulations;Joint Manipulations    PT Next Visit Plan estim with exercises as needed for pain; lumbopelvic strengthening/stabilization + progress extension based exercises; review & update HEP PRN; manual therapy including DN as indicated & benefit noted; modalities PRN    PT Home Exercise Plan Access Code: [email or web address](11/3, updated 11/8 & 11/17), Access Code: [email or web address] (12/7)    Consulted and Agree with Plan of Care Patient             Patient will benefit from skilled therapeutic intervention in order to improve the following deficits and impairments:  Decreased activity tolerance, Decreased knowledge of precautions, Decreased mobility, Decreased range of motion, Decreased strength, Increased fascial restricitons, Increased muscle spasms, Impaired perceived functional ability, Impaired flexibility, Improper body mechanics, Postural dysfunction, Pain  Visit Diagnosis: Sciatica, left side  Other muscle spasm  Muscle weakness (generalized)  Abnormal posture  Other abnormalities of gait and mobility     Problem List Patient Active Problem List   Diagnosis Date Noted   H/O adenomatous polyp of colon 08/24/2020   Hyperglycemia  08/24/2020   Snoring 08/23/2019   Insomnia 08/23/2019   COVID-19 05/31/2019   Food intolerance in adult 11/29/2018   Seasonal allergic conjunctivitis 11/29/2018   Allergic rhinitis 11/29/2018   History of asthma 11/29/2018   Intramural leiomyoma of uterus 02/06/2018   Reactive airways dysfunction syndrome ([place] 07/20/2016   Genetic testing 02/24/2016   Family history of colon cancer 12/27/2015   Malignant neoplasm of lower-outer quadrant of right breast of female, estrogen receptor negative ([place] 12/20/2015   Preventative health care 08/23/2015   Perimenopause 08/23/2015   History of chicken pox 08/23/2015   H/O recurrent pneumonia 08/23/2015   Allergic state 08/23/2015   Alkaline phosphatase deficiency 11/10/2012   RAD (reactive airway disease) 05/25/2012   Other irritable bowel syndrome 10/15/2008    BArtist Pais PTA 04/02/2021, 12:19 PM  [facility] [place] [address]Phone: [phone]  Fax:  [phone] Name: Barbara BOXXMRN: [id number] of Birth: [birth year]  PHYSICAL THERAPY DISCHARGE SUMMARY  Visits from Start of Care: 7   Current functional level related to goals / functional outcomes:   Refer to above clinical impression for status as of last visit on 04/02/2021. Patient cancelled remaining scheduled visits due to illness then was going out of town. She has not returned to PT in >30 days, therefore will proceed with discharge from PT for this episode.   Remaining deficits:   As above. Unable to formally assess status at discharge due to failure to return to PT.   Education / Equipment:   HEP   Patient agrees to discharge. Patient goals were partially met. Patient is being discharged due to not returning since the last visit.  JPercival Spanish PT, MPT 05/15/21, 9:16 AM  [facility] [phone]  [employer]   [place], [place], [address] Phone: [phone]   Fax:  [phone]

## 2021-04-08 ENCOUNTER — Ambulatory Visit (INDEPENDENT_AMBULATORY_CARE_PROVIDER_SITE_OTHER): Payer: BLUE CROSS/BLUE SHIELD

## 2021-04-08 ENCOUNTER — Ambulatory Visit: Payer: BLUE CROSS/BLUE SHIELD | Admitting: Physical Therapy

## 2021-04-08 DIAGNOSIS — J309 Allergic rhinitis, unspecified: Secondary | ICD-10-CM | POA: Diagnosis not present

## 2021-04-11 ENCOUNTER — Other Ambulatory Visit: Payer: Self-pay | Admitting: Family Medicine

## 2021-04-11 DIAGNOSIS — M5440 Lumbago with sciatica, unspecified side: Secondary | ICD-10-CM

## 2021-04-14 NOTE — Telephone Encounter (Signed)
Pt aware.

## 2021-04-16 ENCOUNTER — Ambulatory Visit (HOSPITAL_BASED_OUTPATIENT_CLINIC_OR_DEPARTMENT_OTHER)
Admission: RE | Admit: 2021-04-16 | Discharge: 2021-04-16 | Disposition: A | Discharge status: Home / Self Care | Payer: BLUE CROSS/BLUE SHIELD | Source: Ambulatory Visit | Attending: Family Medicine | Admitting: Family Medicine

## 2021-04-16 ENCOUNTER — Other Ambulatory Visit: Payer: Self-pay

## 2021-04-16 ENCOUNTER — Ambulatory Visit (INDEPENDENT_AMBULATORY_CARE_PROVIDER_SITE_OTHER): Payer: PRIVATE HEALTH INSURANCE | Admitting: Medical

## 2021-04-16 VITALS — BP 110/55 | HR 66 | Temp 98.3°F | Resp 18 | Ht 66.0 in | Wt 156.0 lb

## 2021-04-16 DIAGNOSIS — M5416 Radiculopathy, lumbar region: Secondary | ICD-10-CM | POA: Diagnosis not present

## 2021-04-16 DIAGNOSIS — J40 Bronchitis, not specified as acute or chronic: Secondary | ICD-10-CM | POA: Diagnosis not present

## 2021-04-16 DIAGNOSIS — M5440 Lumbago with sciatica, unspecified side: Secondary | ICD-10-CM | POA: Insufficient documentation

## 2021-04-16 DIAGNOSIS — G8929 Other chronic pain: Secondary | ICD-10-CM

## 2021-04-16 DIAGNOSIS — R058 Other specified cough: Secondary | ICD-10-CM | POA: Diagnosis not present

## 2021-04-16 MED ORDER — AZITHROMYCIN 250 MG PO TABS: ORAL_TABLET | ORAL | 0 refills | Status: AC

## 2021-04-16 MED ORDER — HYDROCODONE BIT-HOMATROP MBR 5-1.5 MG/5ML PO SOLN: 5.0000 mL | Freq: Three times a day (TID) | ORAL | 0 refills | Status: DC | PRN

## 2021-04-16 NOTE — Progress Notes (Signed)
Subjective:    Patient ID: Barbara Fuentes, female    DOB: 12-04-1965, 55 y.o.   MRN: [id number]  HPI  Pt in for chest congestion for about 10 days.  She states gradually getting worse. She is bringing up a lot of colored mucus. No fever, no chills or sweats.  At onset of illness had low grade fever for first day. Mild sneezing and nasal congestion but never had diffuse body aches. She states not flu like and states did not do covid test.  Some wheezing at night. Hx of wheezing when gets bronchitis.   Pt former smoker but quit in 2012.  Pt had 4 covid vaccines in past(last covid vaccine early oct). Did get flu vaccine this year.   Also has complaint about low back pain. She got xray today. But report is not back yet. Pt has radiating pain to left leg. Pain radiates all the way down toward left achilles tendon area. Pt states pain since April. Pt went to PT and had dry needling. Dry needling only gave pain relief for 24 hour.  Pt has been taking alleve for pain when increases. Pt states pain noticed more when changing position after sitting for long time and changing positions. Pain can be about 10/10 for a minute and half then decreases to no pain. Radiating pain has been present since April.      Review of Systems  Constitutional:  Negative for chills, fatigue and fever.  HENT:  Negative for congestion.   Respiratory:  Positive for cough. Negative for chest tightness, shortness of breath, wheezing and stridor.   Cardiovascular:  Negative for chest pain and palpitations.  Genitourinary:  Negative for dysuria and frequency.  Musculoskeletal:  Positive for back pain.  Skin:  Negative for rash.  Hematological:  Negative for adenopathy. Does not bruise/bleed easily.  Psychiatric/Behavioral:  Negative for behavioral problems and confusion.    Past Medical History:  Diagnosis Date   Abnormal Pap smear of cervix    before children   Alkaline phosphatase deficiency    28 in 9/11    Allergic state 08/23/2015   Arthritis    hip   Breast cancer of lower-outer quadrant of right female breast ([place]) 12/20/2015   right breast   Eustachian tube dysfunction 02/2014   H/O recurrent pneumonia 08/23/2015   History of chicken pox 08/23/2015   History of radiation therapy 03/05/16- 04/02/16   Right Breast treated to 40.05 Gy in 15 fractions & Right Breast boost to 10 Gy in 5 fractions.    Low BP    Personal history of radiation therapy    2017 right breast   Pneumonia    RAD (reactive airway disease)     FORMER smoker , RAD with RTI's, cat or allergen exposure   Sacral pain 2014   trauma ( fall)     Social History   Socioeconomic History   Marital status: Married    Spouse name: Not on file   Number of children: Not on file   Years of education: Not on file   Highest education level: Not on file  Occupational History   Occupation: Chemical engineer   Tobacco Use   Smoking status: Former    Years: 28.00    Types: Cigarettes    Quit date: 06/18/2010    Years since quitting: 10.8   Smokeless tobacco: Never   Tobacco comments:    smoked age [age] up to 1 ppd  Vaping Use   Vaping  Use: Never used  Substance and Sexual Activity   Alcohol use: Not Currently   Drug use: No   Sexual activity: Yes    Birth control/protection: Post-menopausal  Other Topics Concern   Not on file  Social History Narrative   Not on file   Social Determinants of Health   Financial Resource Strain: Not on file  Food Insecurity: Not on file  Transportation Needs: Not on file  Physical Activity: Not on file  Stress: Not on file  Social Connections: Not on file  Intimate Partner Violence: Not on file    Past Surgical History:  Procedure Laterality Date   BREAST BIOPSY Right 12/19/2015   BREAST LUMPECTOMY Right 01/20/2016   Procedure: RIGHT BREAST LUMPECTOMY;  Surgeon: [name], MD;  Location: [facility];  Service: General;  Laterality: Right;   BREAST LUMPECTOMY WITH RADIOACTIVE SEED  LOCALIZATION Right 01/10/2016   Procedure: BREAST LUMPECTOMY WITH RADIOACTIVE SEED LOCALIZATION;  Surgeon: [name], MD;  Location: [facility];  Service: General;  Laterality: Right;   CERVICAL FUSION  07/30/2010   & Discectomy, Dr Vertell Limber ( @ 3 levels)/has plate and 6 screws   COLONOSCOPY  07/23/2017   Dr.Armbruster   POLYPECTOMY     SEPTOPLASTY  1983   for septal deviation   TONSILLECTOMY     uterine ablation  2008   Dr Ronita Hipps   WISDOM TOOTH EXTRACTION      Family History  Problem Relation Age of Onset   Heart disease Mother        cardiac rest, epinephrine with Novocaine   Arthritis Mother    Diabetes Father        Type II, AO   Alcohol abuse Father    Depression Father    Prostate cancer Father 63   Lung cancer Father 92   Heart disease Father        chf   Heart disease Sister    Depression Brother    HIV Brother        AIDS   Cancer Paternal Aunt        probable ovarian   Other Maternal Grandmother        issues w/ breast in her 90s--may have been breast cancer   Colon cancer Maternal Grandfather        dx late 8s; w/ colostomy   COPD Paternal Grandmother    Alcohol abuse Paternal Grandmother        smoker   Emphysema Paternal Grandmother    Bronchitis Paternal Grandmother    Heart attack Paternal Grandfather 58   Heart disease Paternal Grandfather    Alcohol abuse Paternal Grandfather        smoker   Stroke Neg Hx    Asthma Neg Hx    Allergic rhinitis Neg Hx    Angioedema Neg Hx    Eczema Neg Hx    Immunodeficiency Neg Hx    Urticaria Neg Hx    Colon polyps Neg Hx    Esophageal cancer Neg Hx    Rectal cancer Neg Hx    Stomach cancer Neg Hx     Allergies  Allergen Reactions   Codeine     REACTION: nausea & vomiting   Procaine Hcl     REACTION: tachycardia in family members!    Current Outpatient Medications on File Prior to Visit  Medication Sig Dispense Refill   albuterol (PROAIR HFA) 108 (90 Base) MCG/ACT inhaler Inhale 2  puffs into the lungs every 4 (  four) hours as needed for wheezing or shortness of breath. 18 g 1   EPINEPHrine (EPIPEN 2-PAK) 0.3 mg/0.3 mL IJ SOAJ injection Use as directed for severe allergic reactions 2 each 2   fexofenadine-pseudoephedrine (ALLEGRA-D 24) 180-240 MG 24 hr tablet Take 1 tablet by mouth daily.     fluticasone (FLONASE) 50 MCG/ACT nasal spray Place 1-2 sprays into both nostrils daily as needed (for stuffy nose). 16 g 5   Probiotic Product ([place]) Take by mouth. Take one pill daily     UNABLE TO FIND Take 2 capsules by mouth daily. Med Name: Essential for Women Multivitamin     UNABLE TO FIND Med Name: Allergy Shots weekly     No current facility-administered medications on file prior to visit.    BP (!) 110/55    Pulse 66    Temp 98.3 F (36.8 C)    Resp 18    Ht 5\' 6"  (1.676 m)    Wt 156 lb (70.8 kg)    LMP 04/25/2015 Comment: Uterine ablations   SpO2 100%    BMI 25.18 kg/m        Objective:   Physical Exam  General- No acute distress. Pleasant patient. Neck- Full range of motion, no jvd Lungs- Clear, even and unlabored. Heart- regular rate and rhythm. Neurologic- CNII- XII grossly intact.  Heent-mid frontal and maxillary sinus pressure. Back- mild left si tenderness to palpation. Lower ext- l5-s1 senstion intact.      Assessment & Plan:   Patient Instructions  You appear to have bronchitis. Rest hydrate and tylenol for fever. I am prescribing cough medicine hycodan and azithromcin antibiotic. For your nasal congestion you could use otc the counter nasal steroid.   You should gradually get better. If not then can get chest xray. Future order placed.  For chronic back pain can use alleve if needed. You may get some secondary benefit/pain relief with narcotic based cough syrup. Xray of back pending. Could consider sport med referral. You pcp order xray show will see what she recommends.  Follow up in 7-10 days or as needed    [employer],  [employer]

## 2021-04-16 NOTE — Patient Instructions (Addendum)
You appear to have bronchitis(possible sinus infection as well). Rest hydrate and tylenol for fever. I am prescribing cough medicine hycodan and azithrymcin antibiotic. For your nasal congestion you could use otc the counter nasal steroid.   You should gradually get better. If not then can get chest xray. Future order placed.  For chronic back pain can use alleve if needed. You may get some secondary benefit/pain relief with narcotic based cough syrup. Xray of back pending. Could consider sport med referral. You pcp order xray show will see what she recommends.  Follow up in 7-10 days or as needed

## 2021-04-17 ENCOUNTER — Other Ambulatory Visit: Payer: Self-pay

## 2021-04-17 DIAGNOSIS — M5440 Lumbago with sciatica, unspecified side: Secondary | ICD-10-CM

## 2021-04-29 ENCOUNTER — Ambulatory Visit (INDEPENDENT_AMBULATORY_CARE_PROVIDER_SITE_OTHER): Payer: BLUE CROSS/BLUE SHIELD

## 2021-04-29 DIAGNOSIS — J309 Allergic rhinitis, unspecified: Secondary | ICD-10-CM

## 2021-05-06 ENCOUNTER — Ambulatory Visit (INDEPENDENT_AMBULATORY_CARE_PROVIDER_SITE_OTHER): Payer: BLUE CROSS/BLUE SHIELD

## 2021-05-06 DIAGNOSIS — J309 Allergic rhinitis, unspecified: Secondary | ICD-10-CM | POA: Diagnosis not present

## 2021-05-12 ENCOUNTER — Ambulatory Visit (INDEPENDENT_AMBULATORY_CARE_PROVIDER_SITE_OTHER): Payer: BLUE CROSS/BLUE SHIELD | Admitting: *Deleted

## 2021-05-12 DIAGNOSIS — J309 Allergic rhinitis, unspecified: Secondary | ICD-10-CM | POA: Diagnosis not present

## 2021-05-19 ENCOUNTER — Ambulatory Visit (INDEPENDENT_AMBULATORY_CARE_PROVIDER_SITE_OTHER): Payer: BLUE CROSS/BLUE SHIELD

## 2021-05-19 DIAGNOSIS — J309 Allergic rhinitis, unspecified: Secondary | ICD-10-CM

## 2021-05-27 ENCOUNTER — Other Ambulatory Visit: Payer: Self-pay | Admitting: Neurosurgery

## 2021-05-27 DIAGNOSIS — M5416 Radiculopathy, lumbar region: Secondary | ICD-10-CM

## 2021-05-29 ENCOUNTER — Ambulatory Visit
Admission: RE | Admit: 2021-05-29 | Discharge: 2021-05-29 | Disposition: A | Discharge status: Home / Self Care | Payer: BLUE CROSS/BLUE SHIELD | Source: Ambulatory Visit | Attending: Neurosurgery | Admitting: Neurosurgery

## 2021-05-29 DIAGNOSIS — M5416 Radiculopathy, lumbar region: Secondary | ICD-10-CM

## 2021-05-29 MED ORDER — METHYLPREDNISOLONE ACETATE 40 MG/ML INJ SUSP (RADIOLOG
80.0000 mg | Freq: Once | INTRAMUSCULAR | Status: AC
  Administered 2021-05-29: 80 mg via EPIDURAL

## 2021-05-29 MED ORDER — IOPAMIDOL (ISOVUE-M 200) INJECTION 41%
1.0000 mL | Freq: Once | INTRAMUSCULAR | Status: AC
  Administered 2021-05-29: 1 mL via EPIDURAL

## 2021-05-29 NOTE — Discharge Instructions (Signed)

## 2021-06-03 ENCOUNTER — Ambulatory Visit (INDEPENDENT_AMBULATORY_CARE_PROVIDER_SITE_OTHER): Payer: BLUE CROSS/BLUE SHIELD

## 2021-06-03 DIAGNOSIS — J309 Allergic rhinitis, unspecified: Secondary | ICD-10-CM

## 2021-06-09 ENCOUNTER — Ambulatory Visit (INDEPENDENT_AMBULATORY_CARE_PROVIDER_SITE_OTHER): Payer: BLUE CROSS/BLUE SHIELD | Admitting: *Deleted

## 2021-06-09 DIAGNOSIS — J309 Allergic rhinitis, unspecified: Secondary | ICD-10-CM | POA: Diagnosis not present

## 2021-06-30 ENCOUNTER — Ambulatory Visit (INDEPENDENT_AMBULATORY_CARE_PROVIDER_SITE_OTHER): Payer: BLUE CROSS/BLUE SHIELD

## 2021-06-30 DIAGNOSIS — J309 Allergic rhinitis, unspecified: Secondary | ICD-10-CM

## 2021-07-07 DIAGNOSIS — J301 Allergic rhinitis due to pollen: Secondary | ICD-10-CM | POA: Diagnosis not present

## 2021-07-07 NOTE — Progress Notes (Signed)
VIALS EXP 07-08-22 ?

## 2021-07-08 ENCOUNTER — Other Ambulatory Visit: Payer: Self-pay | Admitting: Neurosurgery

## 2021-07-08 DIAGNOSIS — M5416 Radiculopathy, lumbar region: Secondary | ICD-10-CM

## 2021-07-28 ENCOUNTER — Ambulatory Visit (INDEPENDENT_AMBULATORY_CARE_PROVIDER_SITE_OTHER): Payer: BLUE CROSS/BLUE SHIELD

## 2021-07-28 ENCOUNTER — Other Ambulatory Visit: Payer: PRIVATE HEALTH INSURANCE

## 2021-07-28 DIAGNOSIS — J309 Allergic rhinitis, unspecified: Secondary | ICD-10-CM | POA: Diagnosis not present

## 2021-08-13 ENCOUNTER — Ambulatory Visit
Admission: RE | Admit: 2021-08-13 | Discharge: 2021-08-13 | Disposition: A | Discharge status: Home / Self Care | Payer: PRIVATE HEALTH INSURANCE | Source: Ambulatory Visit | Attending: Neurosurgery | Admitting: Neurosurgery

## 2021-08-13 DIAGNOSIS — M5416 Radiculopathy, lumbar region: Secondary | ICD-10-CM

## 2021-08-13 MED ORDER — METHYLPREDNISOLONE ACETATE 40 MG/ML INJ SUSP (RADIOLOG
80.0000 mg | Freq: Once | INTRAMUSCULAR | Status: AC
  Administered 2021-08-13: 80 mg via EPIDURAL

## 2021-08-13 MED ORDER — IOPAMIDOL (ISOVUE-M 200) INJECTION 41%
1.0000 mL | Freq: Once | INTRAMUSCULAR | Status: AC
  Administered 2021-08-13: 1 mL via EPIDURAL

## 2021-08-13 NOTE — Discharge Instructions (Signed)

## 2021-08-18 ENCOUNTER — Ambulatory Visit (INDEPENDENT_AMBULATORY_CARE_PROVIDER_SITE_OTHER): Payer: BLUE CROSS/BLUE SHIELD

## 2021-08-18 DIAGNOSIS — J309 Allergic rhinitis, unspecified: Secondary | ICD-10-CM

## 2021-08-25 ENCOUNTER — Encounter: Payer: PRIVATE HEALTH INSURANCE | Admitting: Family Medicine

## 2021-08-25 ENCOUNTER — Ambulatory Visit (INDEPENDENT_AMBULATORY_CARE_PROVIDER_SITE_OTHER): Payer: PRIVATE HEALTH INSURANCE | Admitting: Family Medicine

## 2021-08-25 ENCOUNTER — Encounter: Payer: Self-pay | Admitting: Family Medicine

## 2021-08-25 VITALS — BP 102/57 | HR 48 | Ht 66.0 in | Wt 149.2 lb

## 2021-08-25 DIAGNOSIS — Z0000 Encounter for general adult medical examination without abnormal findings: Secondary | ICD-10-CM | POA: Diagnosis not present

## 2021-08-25 NOTE — Progress Notes (Signed)
? ?Complete physical exam ? ?Patient: Barbara Fuentes   DOB: 1965/09/17   56 y.o. Female  MRN: [id number] ? ?Subjective:  ?  ?CC: CPE ? ? ?Barbara Fuentes is a 56 y.o. female who presents today for a complete physical exam. She reports consuming a general diet. Home exercise routine includes walking, cleans horse stalls regularly . She generally feels well. She reports sleeping well. She does not have additional problems to discuss today.  ? ? ?Most recent fall risk assessment: ? ?  08/25/2021  ?  1:28 PM  ?Fall Risk   ?Falls in the past year? 0  ?Number falls in past yr: 0  ?Injury with Fall? 0  ?Risk for fall due to : No Fall Risks  ?Follow up Falls evaluation completed  ? ?  ?Most recent depression screenings: ? ?  08/25/2021  ?  1:28 PM 01/08/2021  ? 11:51 AM  ?PHQ 2/9 Scores  ?PHQ - 2 Score 0 0  ? ? ?Vision:Within last year, Dental: No current dental problems and Receives regular dental care, and STD: no concerns  ? ?Patient Active Problem List  ? Diagnosis Date Noted  ? H/O adenomatous polyp of colon 08/24/2020  ? Hyperglycemia 08/24/2020  ? Snoring 08/23/2019  ? Insomnia 08/23/2019  ? COVID-19 05/31/2019  ? Food intolerance in adult 11/29/2018  ? Seasonal allergic conjunctivitis 11/29/2018  ? Allergic rhinitis 11/29/2018  ? History of asthma 11/29/2018  ? Intramural leiomyoma of uterus 02/06/2018  ? Reactive airways dysfunction syndrome ([place]) 07/20/2016  ? Genetic testing 02/24/2016  ? Family history of colon cancer 12/27/2015  ? Malignant neoplasm of lower-outer quadrant of right breast of female, estrogen receptor negative ([place]) 12/20/2015  ? Preventative health care 08/23/2015  ? Perimenopause 08/23/2015  ? History of chicken pox 08/23/2015  ? H/O recurrent pneumonia 08/23/2015  ? Allergic state 08/23/2015  ? Alkaline phosphatase deficiency 11/10/2012  ? RAD (reactive airway disease) 05/25/2012  ? Other irritable bowel syndrome 10/15/2008  ? ?Past Medical History:  ?Diagnosis Date  ? Abnormal Pap smear of cervix    ? before children  ? Alkaline phosphatase deficiency   ? 29 in 9/11  ? Allergic state 08/23/2015  ? Arthritis   ? hip  ? Breast cancer of lower-outer quadrant of right female breast ([place]) 12/20/2015  ? right breast  ? Eustachian tube dysfunction 02/2014  ? H/O recurrent pneumonia 08/23/2015  ? History of chicken pox 08/23/2015  ? History of radiation therapy 03/05/16- 04/02/16  ? Right Breast treated to 40.05 Gy in 15 fractions & Right Breast boost to 10 Gy in 5 fractions.   ? Low BP   ? Personal history of radiation therapy   ? 2017 right breast  ? Pneumonia   ? RAD (reactive airway disease)   ?  FORMER smoker , RAD with RTI's, cat or allergen exposure  ? Sacral pain 2014  ? trauma ( fall)  ? ?  ? ?Patient Care Team: ?[name], MD as PCP - General (Family Medicine) ?Brien Few, MD as Consulting Physician (Obstetrics and Gynecology) ?Willow Ora as Geophysical data processor) ?Hollar, Katharine Look, MD as Consulting Physician (Dermatology) ?[name], MD as Consulting Physician (General Surgery) ?Nicholas Lose, MD as Consulting Physician (Hematology and Oncology) ?Eppie Gibson, MD as Attending Physician (Radiation Oncology) ?Gardenia Phlegm, NP as Nurse Practitioner (Hematology and Oncology)  ? ?Outpatient Medications Prior to Visit  ?Medication Sig  ? albuterol (PROAIR HFA) 108 (90 Base) MCG/ACT inhaler Inhale 2  puffs into the lungs every 4 (four) hours as needed for wheezing or shortness of breath.  ? EPINEPHrine (EPIPEN 2-PAK) 0.3 mg/0.3 mL IJ SOAJ injection Use as directed for severe allergic reactions  ? fexofenadine-pseudoephedrine (ALLEGRA-D 24) 180-240 MG 24 hr tablet Take 1 tablet by mouth daily.  ? fluticasone (FLONASE) 50 MCG/ACT nasal spray Place 1-2 sprays into both nostrils daily as needed (for stuffy nose).  ? Probiotic Product ([place]) Take by mouth. Take one pill daily  ? UNABLE TO FIND Take 2 capsules by mouth daily. Med Name: Essential for Women  Multivitamin  ? UNABLE TO FIND Med Name: Allergy Shots weekly  ? [DISCONTINUED] HYDROcodone bit-homatropine (HYCODAN) 5-1.5 MG/5ML syrup Take 5 mLs by mouth every 8 (eight) hours as needed for cough.  ? ?No facility-administered medications prior to visit.  ? ? ?ROS ?All review of systems negative except what is listed in the HPI ? ? ? ? ?   ?Objective:  ? ?  ?BP (!) 102/57   Pulse (!) 48   Ht '5\' 6"'$  (1.676 m)   Wt 149 lb 3.2 oz (67.7 kg)   LMP 04/25/2015 Comment: Uterine ablations  BMI 24.08 kg/m?  ? ? ?Physical Exam ?Vitals reviewed.  ?Constitutional:   ?   Appearance: Normal appearance. She is normal weight.  ?HENT:  ?   Head: Normocephalic and atraumatic.  ?   Right Ear: Tympanic membrane normal.  ?   Left Ear: Tympanic membrane normal.  ?   Nose: Nose normal.  ?   Mouth/Throat:  ?   Mouth: Mucous membranes are moist.  ?   Pharynx: Oropharynx is clear.  ?Eyes:  ?   Extraocular Movements: Extraocular movements intact.  ?   Conjunctiva/sclera: Conjunctivae normal.  ?   Pupils: Pupils are equal, round, and reactive to light.  ?Cardiovascular:  ?   Rate and Rhythm: Normal rate and regular rhythm.  ?   Pulses: Normal pulses.  ?   Heart sounds: Normal heart sounds.  ?Pulmonary:  ?   Effort: Pulmonary effort is normal.  ?   Breath sounds: Normal breath sounds.  ?Abdominal:  ?   General: Abdomen is flat. Bowel sounds are normal. There is no distension.  ?   Palpations: Abdomen is soft. There is no mass.  ?   Tenderness: There is no abdominal tenderness. There is no guarding or rebound.  ?Musculoskeletal:     ?   General: Normal range of motion.  ?   Cervical back: Normal range of motion and neck supple.  ?   Right lower leg: No edema.  ?   Left lower leg: No edema.  ?Skin: ?   General: Skin is warm.  ?   Capillary Refill: Capillary refill takes less than 2 seconds.  ?Neurological:  ?   General: No focal deficit present.  ?   Mental Status: She is alert and oriented to person, place, and time. Mental status is at  baseline.  ?Psychiatric:     ?   Mood and Affect: Mood normal.     ?   Behavior: Behavior normal.     ?   Thought Content: Thought content normal.     ?   Judgment: Judgment normal.  ?  ? ?No results found for any visits on 08/25/21. ? ?   ?Assessment & Plan:  ?  ?Routine Health Maintenance and Physical Exam ? ?Immunization History  ?Administered Date(s) Administered  ? Influenza Split 02/23/2011  ? Influenza  Whole 01/07/2010  ? Influenza,inj,Quad PF,6+ Mos 02/01/2019  ? PFIZER(Purple Top)SARS-COV-2 Vaccination 07/27/2019, 08/21/2019, 04/04/2020  ? Td 04/27/2001  ? Tdap 05/25/2012  ? Zoster Recombinat (Shingrix) 12/15/2019, 04/11/2020  ? ? ?Health Maintenance  ?Topic Date Due  ? HIV Screening  Never done  ? COVID-19 Vaccine (4 - Booster for Pfizer series) 05/30/2020  ? INFLUENZA VACCINE  11/25/2021  ? TETANUS/TDAP  05/25/2022  ? MAMMOGRAM  01/02/2023  ? PAP SMEAR-Modifier  01/09/2024  ? COLONOSCOPY (Pts 45-67yr Insurance coverage will need to be confirmed)  11/20/2025  ? Hepatitis C Screening  Completed  ? Zoster Vaccines- Shingrix  Completed  ? HPV VACCINES  Aged Out  ? ? ?Discussed health benefits of physical activity, and encouraged her to engage in regular exercise appropriate for her age and condition. ? ?Problem List Items Addressed This Visit   ?None ?Visit Diagnoses   ? ? Annual physical exam    -  Primary  ? Relevant Orders  ? CBC  ? Comprehensive metabolic panel  ? Lipid panel  ? TSH  ? ?  ? ?Return in about 1 year (around 08/26/2022) for CPE. ? ?  ? ?TTerrilyn Saver NP ? ? ?

## 2021-08-26 ENCOUNTER — Other Ambulatory Visit (INDEPENDENT_AMBULATORY_CARE_PROVIDER_SITE_OTHER): Payer: PRIVATE HEALTH INSURANCE

## 2021-08-26 DIAGNOSIS — Z0000 Encounter for general adult medical examination without abnormal findings: Secondary | ICD-10-CM

## 2021-08-26 LAB — COMPREHENSIVE METABOLIC PANEL
ALT: 15 U/L (ref 0–35)
AST: 14 U/L (ref 0–37)
Albumin: 4.3 g/dL (ref 3.5–5.2)
Alkaline Phosphatase: 33 U/L — ABNORMAL LOW (ref 39–117)
BUN: 9 mg/dL (ref 6–23)
CO2: 27 mEq/L (ref 19–32)
Calcium: 9.3 mg/dL (ref 8.4–10.5)
Chloride: 104 mEq/L (ref 96–112)
Creatinine, Ser: 0.67 mg/dL (ref 0.40–1.20)
GFR: 97.45 mL/min (ref >60.00)
Glucose, Bld: 90 mg/dL (ref 70–99)
Potassium: 4.4 mEq/L (ref 3.5–5.1)
Sodium: 138 mEq/L (ref 135–145)
Total Bilirubin: 0.5 mg/dL (ref 0.2–1.2)
Total Protein: 6.7 g/dL (ref 6.0–8.3)

## 2021-08-26 LAB — LIPID PANEL
Cholesterol: 191 mg/dL (ref 0–200)
HDL: 80.6 mg/dL (ref >39.00)
LDL Cholesterol: 96 mg/dL (ref 0–99)
NonHDL: 110.02
Total CHOL/HDL Ratio: 2
Triglycerides: 70 mg/dL (ref 0.0–149.0)
VLDL: 14 mg/dL (ref 0.0–40.0)

## 2021-08-26 LAB — CBC
HCT: 37.7 % (ref 36.0–46.0)
Hemoglobin: 12.7 g/dL (ref 12.0–15.0)
MCHC: 33.6 g/dL (ref 30.0–36.0)
MCV: 91.9 fl (ref 78.0–100.0)
Platelets: 348 10*3/uL (ref 150.0–400.0)
RBC: 4.1 Mil/uL (ref 3.87–5.11)
RDW: 13.2 % (ref 11.5–15.5)
WBC: 7.5 10*3/uL (ref 4.0–10.5)

## 2021-08-26 LAB — TSH: TSH: 4.68 u[IU]/mL (ref 0.35–5.50)

## 2021-08-29 ENCOUNTER — Encounter: Payer: Self-pay | Admitting: *Deleted

## 2021-09-09 ENCOUNTER — Ambulatory Visit (INDEPENDENT_AMBULATORY_CARE_PROVIDER_SITE_OTHER): Payer: BLUE CROSS/BLUE SHIELD

## 2021-09-09 DIAGNOSIS — J309 Allergic rhinitis, unspecified: Secondary | ICD-10-CM | POA: Diagnosis not present

## 2021-09-18 HISTORY — PX: MICRODISCECTOMY LUMBAR: SUR864

## 2021-09-30 ENCOUNTER — Ambulatory Visit (INDEPENDENT_AMBULATORY_CARE_PROVIDER_SITE_OTHER): Payer: BLUE CROSS/BLUE SHIELD

## 2021-09-30 DIAGNOSIS — J309 Allergic rhinitis, unspecified: Secondary | ICD-10-CM | POA: Diagnosis not present

## 2021-10-20 ENCOUNTER — Ambulatory Visit (INDEPENDENT_AMBULATORY_CARE_PROVIDER_SITE_OTHER): Payer: BLUE CROSS/BLUE SHIELD

## 2021-10-20 DIAGNOSIS — J309 Allergic rhinitis, unspecified: Secondary | ICD-10-CM

## 2021-11-18 ENCOUNTER — Ambulatory Visit (INDEPENDENT_AMBULATORY_CARE_PROVIDER_SITE_OTHER): Payer: BLUE CROSS/BLUE SHIELD

## 2021-11-18 DIAGNOSIS — J309 Allergic rhinitis, unspecified: Secondary | ICD-10-CM

## 2021-11-25 ENCOUNTER — Ambulatory Visit (INDEPENDENT_AMBULATORY_CARE_PROVIDER_SITE_OTHER): Payer: BLUE CROSS/BLUE SHIELD

## 2021-11-25 DIAGNOSIS — J309 Allergic rhinitis, unspecified: Secondary | ICD-10-CM

## 2021-12-02 ENCOUNTER — Ambulatory Visit (INDEPENDENT_AMBULATORY_CARE_PROVIDER_SITE_OTHER): Payer: BLUE CROSS/BLUE SHIELD

## 2021-12-02 DIAGNOSIS — J309 Allergic rhinitis, unspecified: Secondary | ICD-10-CM | POA: Diagnosis not present

## 2021-12-12 ENCOUNTER — Ambulatory Visit (INDEPENDENT_AMBULATORY_CARE_PROVIDER_SITE_OTHER): Payer: BLUE CROSS/BLUE SHIELD | Admitting: *Deleted

## 2021-12-12 DIAGNOSIS — J309 Allergic rhinitis, unspecified: Secondary | ICD-10-CM

## 2022-01-12 ENCOUNTER — Ambulatory Visit (INDEPENDENT_AMBULATORY_CARE_PROVIDER_SITE_OTHER): Payer: BLUE CROSS/BLUE SHIELD

## 2022-01-12 DIAGNOSIS — J309 Allergic rhinitis, unspecified: Secondary | ICD-10-CM | POA: Diagnosis not present

## 2022-01-14 ENCOUNTER — Other Ambulatory Visit: Payer: Self-pay | Admitting: Obstetrics & Gynecology

## 2022-01-14 DIAGNOSIS — Z1231 Encounter for screening mammogram for malignant neoplasm of breast: Secondary | ICD-10-CM

## 2022-01-28 ENCOUNTER — Encounter: Payer: Self-pay | Admitting: Internal Medicine

## 2022-01-28 ENCOUNTER — Ambulatory Visit (INDEPENDENT_AMBULATORY_CARE_PROVIDER_SITE_OTHER): Payer: BLUE CROSS/BLUE SHIELD | Admitting: Internal Medicine

## 2022-01-28 VITALS — BP 90/60 | HR 54 | Temp 98.1°F | Resp 16 | Ht 66.5 in | Wt 151.4 lb

## 2022-01-28 DIAGNOSIS — J3089 Other allergic rhinitis: Secondary | ICD-10-CM | POA: Diagnosis not present

## 2022-01-28 DIAGNOSIS — J4521 Mild intermittent asthma with (acute) exacerbation: Secondary | ICD-10-CM

## 2022-01-28 DIAGNOSIS — J302 Other seasonal allergic rhinitis: Secondary | ICD-10-CM

## 2022-01-28 MED ORDER — MONTELUKAST SODIUM 10 MG PO TABS: 10.0000 mg | ORAL_TABLET | Freq: Every day | ORAL | 1 refills | Status: DC

## 2022-01-28 MED ORDER — METHYLPREDNISOLONE ACETATE 40 MG/ML IJ SUSP
40.0000 mg | Freq: Once | INTRAMUSCULAR | Status: AC
  Administered 2022-01-28: 40 mg via INTRAMUSCULAR

## 2022-01-28 MED ORDER — ALBUTEROL SULFATE HFA 108 (90 BASE) MCG/ACT IN AERS: 2.0000 | INHALATION_SPRAY | Freq: Four times a day (QID) | RESPIRATORY_TRACT | 2 refills | Status: AC | PRN

## 2022-01-28 NOTE — Progress Notes (Signed)
Follow Up Note  RE: Barbara Fuentes MRN: [id number] DOB: 1965-11-11 Date of Office Visit: 01/28/2022  Referring provider: [name], MD Primary care provider: [name], MD  Chief Complaint: Asthma  History of Present Illness: I had the pleasure of seeing Barbara Fuentes for a follow up visit at the [facility] on 01/28/2022. She is a 56 y.o. female, who is being followed for intermittent asthma, allergic rhinitis . Her previous allergy office visit was on 11/29/2018 with Gareth Morgan, [place]. Today is a  follow up for increased chest tightness .  History obtained from patient, chart review.  Allergic Rhinitis - Medical therapy: allegra  - Symptoms: - Adverse effects of medications:  - Immunotherapy: vial 1 (dm-animals-cr-mold), vial 2 (grass-tree) - Large Local Reactions: denies  - Systemic Reactions: denies  - Beta Blockers: denies  - History of Reflux denies  - History of Sinus Surgery denies   ASTHMA - Medical therapy: NONE ran out of meds a few years  - Rescue inhaler use: has not had inhaler  - Symptoms: increased chest tightness, wheezing - Exacerbation history: 0 ABX for respiratory illness since last visit, 0 OCS, 0ED, 0 UC visits in the past year  - ACT: 19 /25 - Adverse effects of medication: denies  - Previous FEV1: 2.74 L,  - Biologic Labs not needed     Assessment and Plan: [name] is a 56 y.o. female with: Mild intermittent asthma with acute exacerbation - Plan: Spirometry with Graph  Seasonal and perennial allergic rhinitis Plan: Patient Instructions  Seasonal and perennial rhinitis: not well  controlled  - Continue Avoidance measures - AIT PLAN:  continue allergy injections and carry epipen on injection days  - Continue with: Allegra (fexofenadine) '180mg'$  table once daily - Start taking:  Ryaltris nasal spray 1 to 2 sprays per nostril daily, after recovery from acute flare can stepdown to [employer] can use an extra dose of the  antihistamine, if needed, for breakthrough symptoms.  - Consider nasal saline rinses 1-2 times daily to remove allergens from the nasal cavities as well as help with mucous clearance (this is especially helpful to do before the nasal sprays are given)  Mild Intermittent  Asthma: not well controlled - Breathing test today showed: some inflammation in your lungs which was not fully reversible  -Depo 40 mg IM injection given today in clinic for acute symptoms  PLAN: . - Daily controller medication(s): Singulair '10mg'$  daily - Prior to physical activity: albuterol 2 puffs 10-15 minutes before physical activity. - Rescue medications: albuterol 4 puffs every 4-6 hours as needed - Asthma control goals:  * Full participation in all desired activities (may need albuterol before activity) * Albuterol use two time or less a week on average (not counting use with activity) * Cough interfering with sleep two time or less a month * Oral steroids no more than once a year * No hospitalizations   Follow up: 6 weeks   Thank you so much for letting me partake in your care today.  Don't hesitate to reach out if you have any additional concerns!  Roney Marion, MD  [facility] [place] [place]  No follow-ups on file.  Meds ordered this encounter  Medications   montelukast (SINGULAIR) 10 MG tablet    Sig: Take 1 tablet (10 mg total) by mouth at bedtime.    Dispense:  90 tablet    Refill:  1   albuterol (  VENTOLIN HFA) 108 (90 Base) MCG/ACT inhaler    Sig: Inhale 2 puffs into the lungs every 6 (six) hours as needed for wheezing or shortness of breath.    Dispense:  8 g    Refill:  2   methylPREDNISolone acetate (DEPO-MEDROL) injection 40 mg    Lab Orders  No laboratory test(s) ordered today   Diagnostics: Spirometry:  Tracings reviewed. Her effort: Good reproducible efforts. FVC: 2.74L FEV1: 2.27L, 80% predicted FEV1/FVC ratio: 83% Interpretation: Spirometry consistent with  possible restrictive disease. After 4 puffs of albuterol  Please see scanned spirometry results for details. Fev1 increased by 40cc and 2%, FVC decreased by 120cc and 4%  Results interpreted by myself during this encounter and discussed with patient/family.   Medication List:  Current Outpatient Medications  Medication Sig Dispense Refill   albuterol (VENTOLIN HFA) 108 (90 Base) MCG/ACT inhaler Inhale 2 puffs into the lungs every 6 (six) hours as needed for wheezing or shortness of breath. 8 g 2   EPINEPHrine (EPIPEN 2-PAK) 0.3 mg/0.3 mL IJ SOAJ injection Use as directed for severe allergic reactions 2 each 2   fexofenadine-pseudoephedrine (ALLEGRA-D 24) 180-240 MG 24 hr tablet Take 1 tablet by mouth daily.     montelukast (SINGULAIR) 10 MG tablet Take 1 tablet (10 mg total) by mouth at bedtime. 90 tablet 1   Probiotic Product ([place]) Take by mouth. Take one pill daily     Turmeric (QC TUMERIC COMPLEX) 500 MG CAPS Take by mouth.     UNABLE TO FIND Med Name: Allergy Shots weekly     No current facility-administered medications for this visit.   Allergies: Allergies  Allergen Reactions   Codeine     REACTION: nausea & vomiting   Procaine Hcl     REACTION: tachycardia in family members!   I reviewed her past medical history, social history, family history, and environmental history and no significant changes have been reported from her previous visit.  ROS: All others negative except as noted per HPI.   Objective: BP 90/60   Pulse (!) 54   Temp 98.1 F (36.7 C) (Temporal)   Resp 16   Ht 5' 6.5" (1.689 m)   Wt 151 lb 6.4 oz (68.7 kg)   LMP 04/25/2015 Comment: Uterine ablations  SpO2 98%   BMI 24.07 kg/m  Body mass index is 24.07 kg/m. General Appearance:  Alert, cooperative, no distress, appears stated age  Head:  Normocephalic, without obvious abnormality, atraumatic  Eyes:  Conjunctiva clear, EOM's intact  Nose: Nares normal, hypertrophic turbinates,  no visible anterior polyps, and septum midline  Throat: Lips, tongue normal; teeth and gums normal, no tonsillar exudate and + cobblestoning  Neck: Supple, symmetrical  Lungs:   clear to auscultation bilaterally, Respirations unlabored, no coughing  Heart:  regular rate and rhythm and no murmur, Appears well perfused  Extremities: No edema  Skin: Skin color, texture, turgor normal, no rashes or lesions on visualized portions of skin   Neurologic: No gross deficits   Previous notes and tests were reviewed. The plan was reviewed with the patient/family, and all questions/concerned were addressed.  It was my pleasure to see Yael today and participate in her care. Please feel free to contact me with any questions or concerns.  Sincerely,  Roney Marion, MD  Allergy & Immunology  [facility] Office: [phone]

## 2022-01-28 NOTE — Patient Instructions (Signed)
Seasonal and perennial rhinitis: not well  controlled  - Continue Avoidance measures - AIT PLAN:  continue allergy injections and carry epipen on injection days  - Continue with: Allegra (fexofenadine) '180mg'$  table once daily - Start taking:  Ryaltris nasal spray 1 to 2 sprays per nostril daily, after recovery from acute flare can stepdown to Flonase - You can use an extra dose of the antihistamine, if needed, for breakthrough symptoms.  - Consider nasal saline rinses 1-2 times daily to remove allergens from the nasal cavities as well as help with mucous clearance (this is especially helpful to do before the nasal sprays are given)  Mild Intermittent  Asthma: not well controlled - Breathing test today showed: some inflammation in your lungs which was not fully reversible  -Depo 40 mg IM injection given today in clinic for acute symptoms  PLAN: . - Daily controller medication(s): Singulair '10mg'$  daily - Prior to physical activity: albuterol 2 puffs 10-15 minutes before physical activity. - Rescue medications: albuterol 4 puffs every 4-6 hours as needed - Asthma control goals:  * Full participation in all desired activities (may need albuterol before activity) * Albuterol use two time or less a week on average (not counting use with activity) * Cough interfering with sleep two time or less a month * Oral steroids no more than once a year * No hospitalizations   Follow up: 6 weeks   Thank you so much for letting me partake in your care today.  Don't hesitate to reach out if you have any additional concerns!  Roney Marion, MD  [facility], [place]

## 2022-02-05 ENCOUNTER — Ambulatory Visit: Payer: PRIVATE HEALTH INSURANCE

## 2022-02-10 ENCOUNTER — Ambulatory Visit (INDEPENDENT_AMBULATORY_CARE_PROVIDER_SITE_OTHER): Payer: BLUE CROSS/BLUE SHIELD

## 2022-02-10 DIAGNOSIS — J309 Allergic rhinitis, unspecified: Secondary | ICD-10-CM

## 2022-03-04 ENCOUNTER — Ambulatory Visit
Admission: RE | Admit: 2022-03-04 | Discharge: 2022-03-04 | Disposition: A | Discharge status: Home / Self Care | Payer: PRIVATE HEALTH INSURANCE | Source: Ambulatory Visit | Attending: Obstetrics & Gynecology | Admitting: Obstetrics & Gynecology

## 2022-03-04 ENCOUNTER — Ambulatory Visit: Payer: PRIVATE HEALTH INSURANCE

## 2022-03-04 DIAGNOSIS — Z1231 Encounter for screening mammogram for malignant neoplasm of breast: Secondary | ICD-10-CM

## 2022-03-06 ENCOUNTER — Ambulatory Visit (INDEPENDENT_AMBULATORY_CARE_PROVIDER_SITE_OTHER): Payer: PRIVATE HEALTH INSURANCE | Admitting: Internal Medicine

## 2022-03-06 VITALS — BP 116/72 | HR 81 | Temp 98.1°F | Resp 16 | Ht 66.5 in | Wt 151.0 lb

## 2022-03-06 DIAGNOSIS — B349 Viral infection, unspecified: Secondary | ICD-10-CM

## 2022-03-06 DIAGNOSIS — J4531 Mild persistent asthma with (acute) exacerbation: Secondary | ICD-10-CM | POA: Diagnosis not present

## 2022-03-06 MED ORDER — PREDNISONE 10 MG PO TABS: ORAL_TABLET | ORAL | 0 refills | Status: DC

## 2022-03-06 MED ORDER — AZITHROMYCIN 250 MG PO TABS: ORAL_TABLET | ORAL | 0 refills | Status: DC

## 2022-03-06 NOTE — Patient Instructions (Signed)
Rest  Drink plenty of fluids  Continue taking DayQuil and NyQuil or you could switch to Mucinex DM.  Use albuterol 2 puffs every 6 hours as needed  Tylenol as needed  I sent a prescription for a round of prednisone and an antibiotic called Z-Pak.  Call if not gradually better  ER if severe symptoms

## 2022-03-06 NOTE — Progress Notes (Unsigned)
Subjective:    Patient ID: Barbara Fuentes, female    DOB: [birth year], 56 y.o.   MRN: [id number]  DOS:  03/06/2022 Type of visit - description: Acute visit  Patient told my nurse symptoms started 5 days ago, she told me this started about a week ago with mild headache, sore throat and cough. Symptoms definitely worse in the last 4 days including increased cough, chest congestion, wheezing. Last night she even got some shortness of breath reach out for albuterol which decreased her symptoms.  No fever chills + Chest pain, worse with cough. No nausea vomiting No unusual aches or pains   Review of Systems See above   Past Medical History:  Diagnosis Date   Abnormal Pap smear of cervix    before children   Alkaline phosphatase deficiency    28 in 9/11   Allergic state 08/23/2015   Arthritis    hip   Breast cancer of lower-outer quadrant of right female breast ([place]) 12/20/2015   right breast   Eustachian tube dysfunction 02/2014   H/O recurrent pneumonia 08/23/2015   History of chicken pox 08/23/2015   History of radiation therapy 03/05/16- 04/02/16   Right Breast treated to 40.05 Gy in 15 fractions & Right Breast boost to 10 Gy in 5 fractions.    Low BP    Personal history of radiation therapy    2017 right breast   Pneumonia    RAD (reactive airway disease)     FORMER smoker , RAD with RTI's, cat or allergen exposure   Sacral pain 2014   trauma ( fall)    Past Surgical History:  Procedure Laterality Date   BREAST BIOPSY Right 12/19/2015   BREAST LUMPECTOMY Right 01/20/2016   Procedure: RIGHT BREAST LUMPECTOMY;  Surgeon: [name], MD;  Location: [facility];  Service: General;  Laterality: Right;   BREAST LUMPECTOMY WITH RADIOACTIVE SEED LOCALIZATION Right 01/10/2016   Procedure: BREAST LUMPECTOMY WITH RADIOACTIVE SEED LOCALIZATION;  Surgeon: [name], MD;  Location: [facility];  Service: General;  Laterality: Right;   CERVICAL FUSION  07/30/2010   &  Discectomy, Dr Vertell Limber ( @ 3 levels)/has plate and 6 screws   MICRODISCECTOMY LUMBAR  09/18/2021   L5-S1   POLYPECTOMY     SEPTOPLASTY  1983   for septal deviation   TONSILLECTOMY     uterine ablation  2008   Dr Ronita Hipps   WISDOM TOOTH EXTRACTION      Current Outpatient Medications  Medication Instructions   albuterol (VENTOLIN HFA) 108 (90 Base) MCG/ACT inhaler 2 puffs, Inhalation, Every 6 hours PRN   EPINEPHrine (EPIPEN 2-PAK) 0.3 mg/0.3 mL IJ SOAJ injection Use as directed for severe allergic reactions   fexofenadine-pseudoephedrine (ALLEGRA-D 24) 180-240 MG 24 hr tablet 1 tablet, Oral, Daily   montelukast (SINGULAIR) 10 mg, Oral, Daily at bedtime   Probiotic Product ([place]) Oral, Take one pill daily    Turmeric (QC TUMERIC COMPLEX) 500 MG CAPS Oral   UNABLE TO FIND Med Name: Allergy Shots weekly       Objective:   Physical Exam BP 116/72   Pulse 81   Temp 98.1 F (36.7 C) (Oral)   Resp 16   Ht 5' 6.5" (1.689 m)   Wt 151 lb (68.5 kg)   LMP 04/25/2015 Comment: Uterine ablations  SpO2 98%   BMI 24.01 kg/m  General:   Well developed, NAD, BMI noted.  Frequent cough noted HEENT:  Normocephalic . Face  symmetric, atraumatic TMs normal.  Throat symmetric and not red.  Sinuses: Slightly TTP bilaterally Lungs:  Increased expiratory time, few rhonchi, scattered wheezing only with forced cough Normal respiratory effort, no intercostal retractions, no accessory muscle use. Heart: RRR,  no murmur.  Lower extremities: no pretibial edema bilaterally  Skin: Not pale. Not jaundice Neurologic:  alert & oriented X3.  Speech normal, gait appropriate for age and unassisted Psych--  Cognition and judgment appear intact.  Cooperative with normal attention span and concentration.  Behavior appropriate. No anxious or depressed appearing.      Assessment     56 year old female. PMH includes history of breast cancer, allergies, reactive airway disease,  perimenopausal, hyperglycemia, last COVID-vaccine 06-2021, presents with  Bronchitis, asthma exacerbation: Symptoms as described above, started a week ago, COVID test negative today. She has frequent cough but nontoxic-appearing. Plan: Prednisone, Z-Pak, albuterol, OTCs.  See detailed plan on the AVS. Call if not gradually better, ER if severe shortness of breath or not responding to albuterol. Patient inquires about a chest x-ray, I do not suspect pneumonia, if she is not improving then she likely will need 1.

## 2022-03-11 ENCOUNTER — Ambulatory Visit (INDEPENDENT_AMBULATORY_CARE_PROVIDER_SITE_OTHER): Payer: BLUE CROSS/BLUE SHIELD

## 2022-03-11 DIAGNOSIS — J309 Allergic rhinitis, unspecified: Secondary | ICD-10-CM | POA: Diagnosis not present

## 2022-04-07 DIAGNOSIS — S8262XA Displaced fracture of lateral malleolus of left fibula, initial encounter for closed fracture: Secondary | ICD-10-CM | POA: Insufficient documentation

## 2022-04-07 NOTE — Progress Notes (Signed)
VIALS EXP 04-08-23 

## 2022-04-08 DIAGNOSIS — J3089 Other allergic rhinitis: Secondary | ICD-10-CM

## 2022-04-13 ENCOUNTER — Ambulatory Visit (INDEPENDENT_AMBULATORY_CARE_PROVIDER_SITE_OTHER): Payer: BLUE CROSS/BLUE SHIELD | Admitting: *Deleted

## 2022-04-13 DIAGNOSIS — J309 Allergic rhinitis, unspecified: Secondary | ICD-10-CM | POA: Diagnosis not present

## 2022-05-12 ENCOUNTER — Ambulatory Visit (INDEPENDENT_AMBULATORY_CARE_PROVIDER_SITE_OTHER): Payer: BLUE CROSS/BLUE SHIELD

## 2022-05-12 DIAGNOSIS — J309 Allergic rhinitis, unspecified: Secondary | ICD-10-CM

## 2022-06-01 ENCOUNTER — Ambulatory Visit (INDEPENDENT_AMBULATORY_CARE_PROVIDER_SITE_OTHER): Payer: BLUE CROSS/BLUE SHIELD

## 2022-06-01 DIAGNOSIS — J309 Allergic rhinitis, unspecified: Secondary | ICD-10-CM | POA: Diagnosis not present

## 2022-06-23 ENCOUNTER — Ambulatory Visit (HOSPITAL_BASED_OUTPATIENT_CLINIC_OR_DEPARTMENT_OTHER): Payer: PRIVATE HEALTH INSURANCE | Admitting: Obstetrics & Gynecology

## 2022-07-06 ENCOUNTER — Ambulatory Visit (INDEPENDENT_AMBULATORY_CARE_PROVIDER_SITE_OTHER): Payer: BLUE CROSS/BLUE SHIELD

## 2022-07-06 DIAGNOSIS — J309 Allergic rhinitis, unspecified: Secondary | ICD-10-CM | POA: Diagnosis not present

## 2022-07-26 IMAGING — XA Imaging study
3 series · 3 of 3 positions shown · non-contrast
Comparison: none

CLINICAL DATA: Lower back pain with left lower extremity
radiculopathy

[Series 1: ortho adipose · 1 of 1 slices shown (1 of 3)]
[im 1/1]
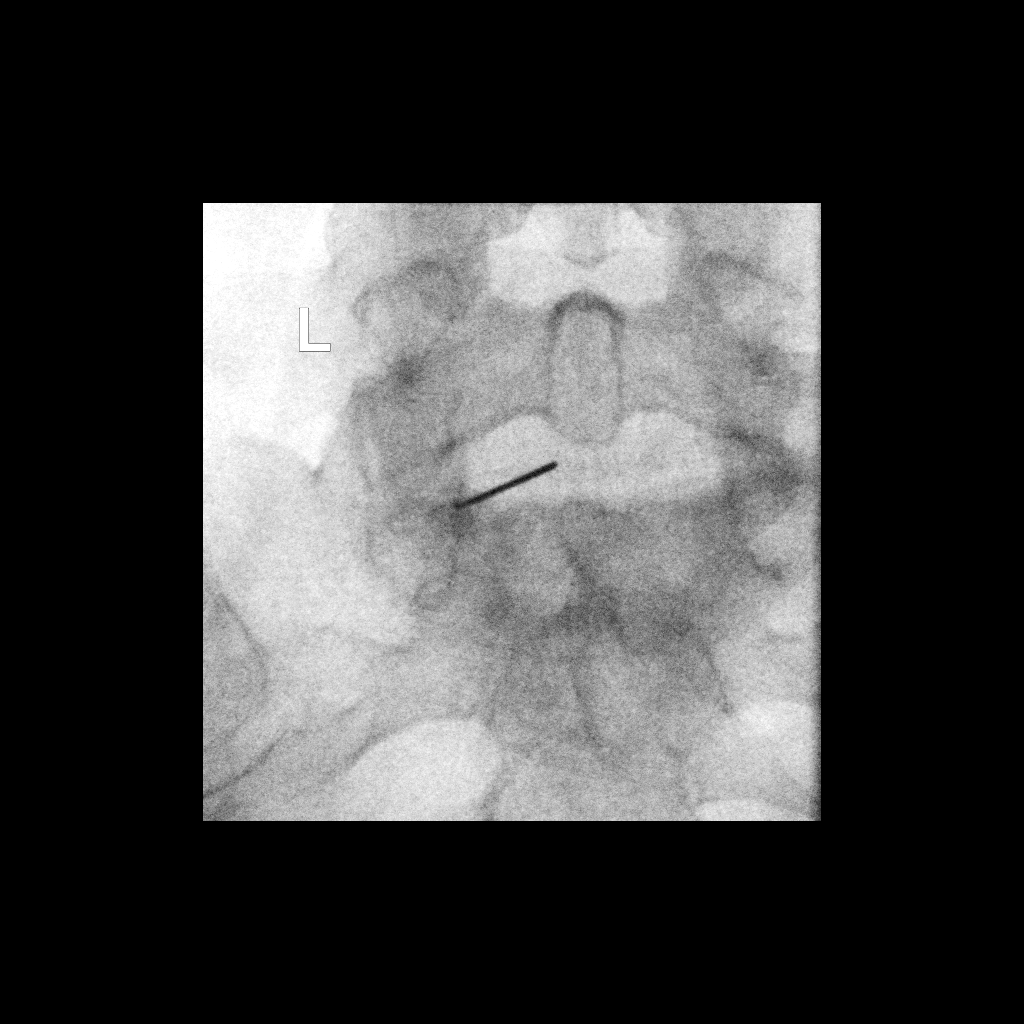

[Series 2: ortho adipose · 1 of 1 slices shown (2 of 3)]
[im 1/1]
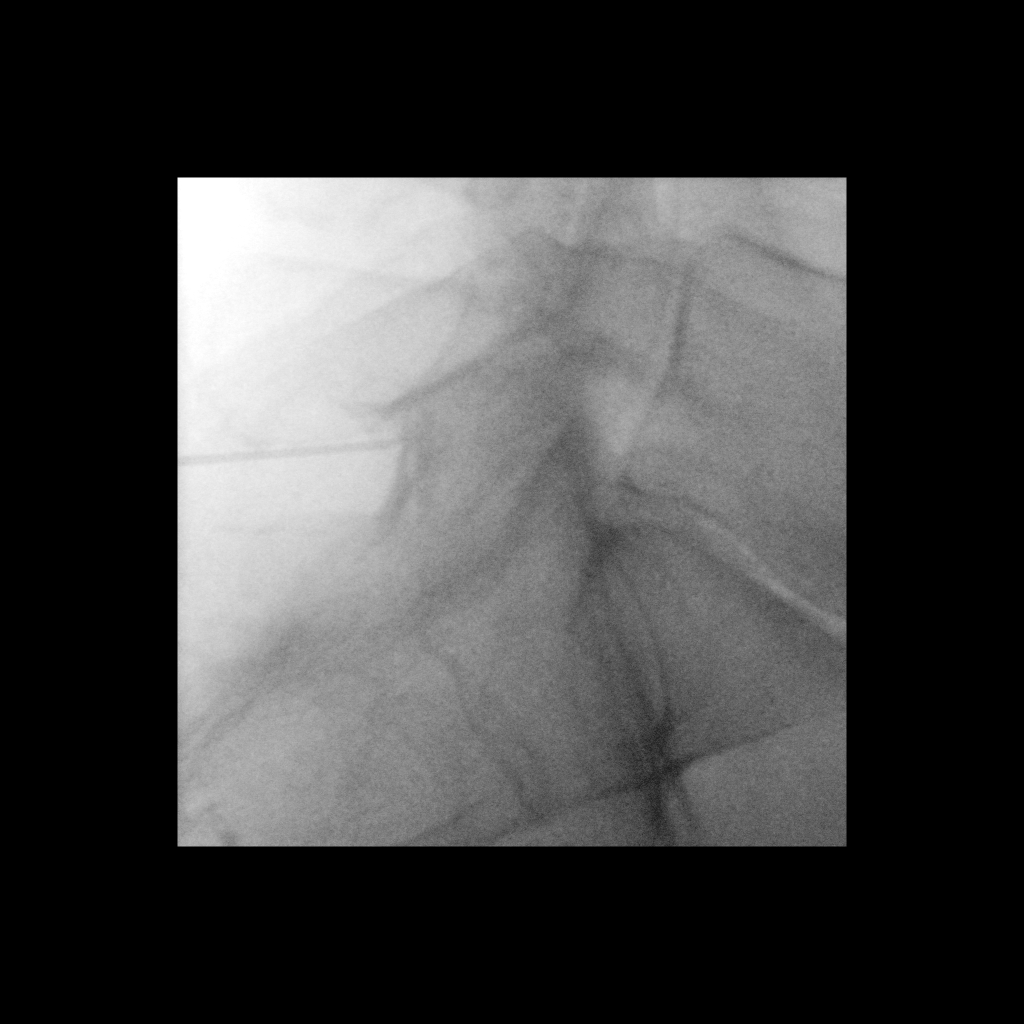

[Series 3: ortho adipose · 1 of 1 slices shown (3 of 3)]
[im 1/1]
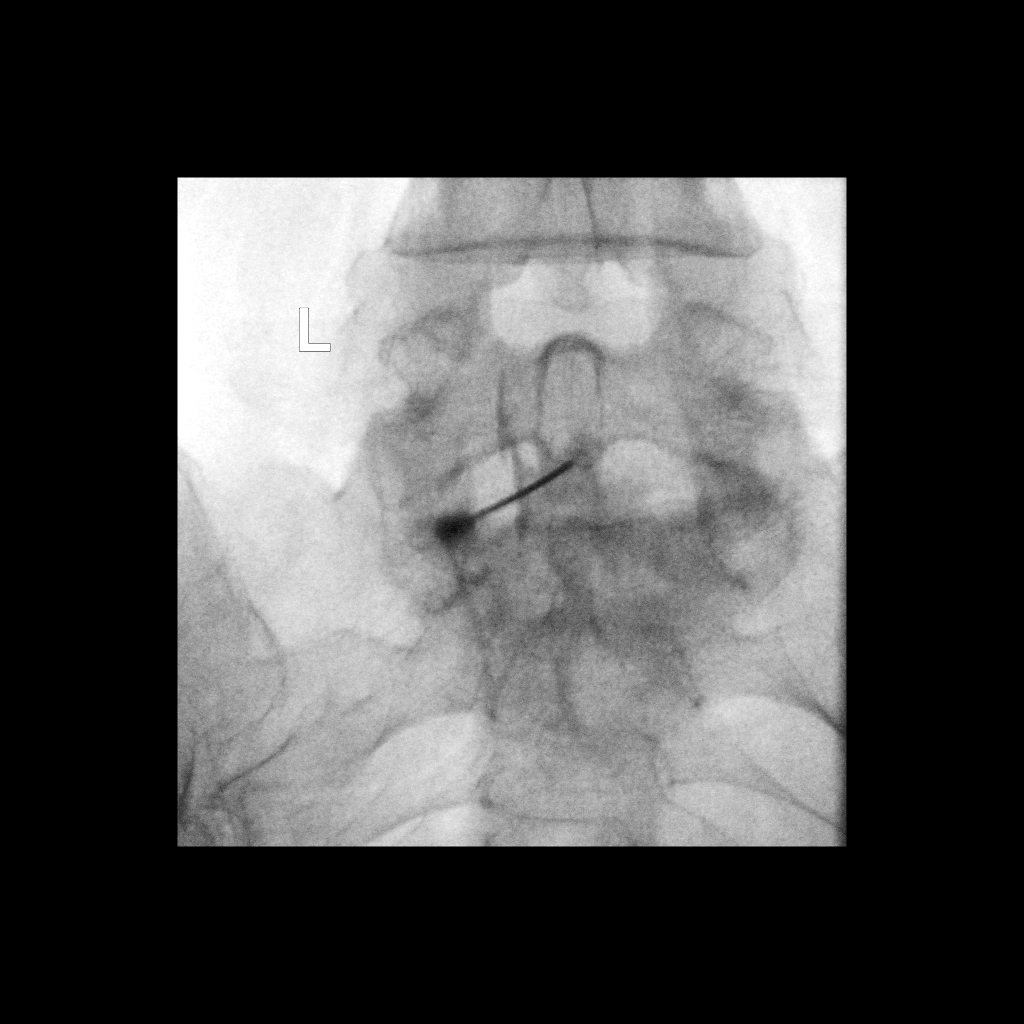

[3 of 3 positions shown; findings below may reference images not displayed]

FLUOROSCOPY:
Radiation Exposure Index (as provided by the fluoroscopic device):
0.4 minutes (4 mGy)

PROCEDURE:
The procedure, risks, benefits, and alternatives were explained to
the patient. Questions regarding the procedure were encouraged and
answered. The patient understands and consents to the procedure.

LUMBAR EPIDURAL INJECTION:

An interlaminar approach was performed on left at L5-S1. The
overlying skin was cleansed and anesthetized. A 20 gauge epidural
needle was advanced using loss-of-resistance technique.

DIAGNOSTIC EPIDURAL INJECTION:

Injection of Isovue-M 200 shows a good epidural pattern with spread
above and below the level of needle placement, primarily on the left
no vascular opacification is seen.

THERAPEUTIC EPIDURAL INJECTION:

80 Mg of Depo-Medrol mixed with 2 mL 1% lidocaine were instilled.
The procedure was well-tolerated, and the patient was discharged
thirty minutes following the injection in good condition.

COMPLICATIONS:
None immediate
IMPRESSION: Technically successful epidural steroid injection on the left L5-S1
# 2.

## 2022-07-30 ENCOUNTER — Ambulatory Visit (INDEPENDENT_AMBULATORY_CARE_PROVIDER_SITE_OTHER): Payer: 59

## 2022-07-30 DIAGNOSIS — J309 Allergic rhinitis, unspecified: Secondary | ICD-10-CM

## 2022-08-10 ENCOUNTER — Encounter (HOSPITAL_BASED_OUTPATIENT_CLINIC_OR_DEPARTMENT_OTHER): Payer: Self-pay | Admitting: Obstetrics & Gynecology

## 2022-08-10 ENCOUNTER — Ambulatory Visit (INDEPENDENT_AMBULATORY_CARE_PROVIDER_SITE_OTHER): Payer: 59 | Admitting: Obstetrics & Gynecology

## 2022-08-10 ENCOUNTER — Ambulatory Visit (INDEPENDENT_AMBULATORY_CARE_PROVIDER_SITE_OTHER): Payer: 59

## 2022-08-10 VITALS — BP 101/72 | HR 56 | Ht 67.0 in | Wt 154.6 lb

## 2022-08-10 DIAGNOSIS — J309 Allergic rhinitis, unspecified: Secondary | ICD-10-CM

## 2022-08-10 DIAGNOSIS — C50511 Malignant neoplasm of lower-outer quadrant of right female breast: Secondary | ICD-10-CM

## 2022-08-10 DIAGNOSIS — Z01419 Encounter for gynecological examination (general) (routine) without abnormal findings: Secondary | ICD-10-CM

## 2022-08-10 DIAGNOSIS — R232 Flushing: Secondary | ICD-10-CM

## 2022-08-10 DIAGNOSIS — Z171 Estrogen receptor negative status [ER-]: Secondary | ICD-10-CM

## 2022-08-10 DIAGNOSIS — Z86010 Personal history of colonic polyps: Secondary | ICD-10-CM

## 2022-08-10 DIAGNOSIS — Z13820 Encounter for screening for osteoporosis: Secondary | ICD-10-CM | POA: Diagnosis not present

## 2022-08-10 DIAGNOSIS — Z860101 Personal history of adenomatous and serrated colon polyps: Secondary | ICD-10-CM

## 2022-08-10 MED ORDER — GABAPENTIN 100 MG PO CAPS: ORAL_CAPSULE | ORAL | 0 refills | Status: DC

## 2022-08-10 NOTE — Progress Notes (Unsigned)
57 y.o. [id number] [name] here for annual exam.  Doing well.  Denies vaginal bleeding.  Having hot flashes.  Discussed treatment options with pt.  Will start Gabapentin.    Patient's last menstrual period was 04/25/2015.          Sexually active: Yes.    The current method of family planning is post menopausal status.    Smoker:  no  Health Maintenance: Pap:  01/08/2021 Negative History of abnormal Pap:  remote hx MMG:  03/04/2022 Negative Colonoscopy:  11/20/2020, follow up 5 years  BMD:   none Screening Labs: Dr. Rogelia Rohrer   reports that she quit smoking about 12 years ago. Her smoking use included cigarettes. She has never used smokeless tobacco. She reports that she does not currently use alcohol. She reports that she does not use drugs.  Past Medical History:  Diagnosis Date   Abnormal Pap smear of cervix    before children   Alkaline phosphatase deficiency    28 in 9/11   Allergic state 08/23/2015   Arthritis    hip   Breast cancer of lower-outer quadrant of right female breast 12/20/2015   right breast   Eustachian tube dysfunction 02/2014   H/O recurrent pneumonia 08/23/2015   History of chicken pox 08/23/2015   History of radiation therapy 03/05/16- 04/02/16   Right Breast treated to 40.05 Gy in 15 fractions & Right Breast boost to 10 Gy in 5 fractions.    Low BP    Personal history of radiation therapy    2017 right breast   Pneumonia    RAD (reactive airway disease)     FORMER smoker , RAD with RTI's, cat or allergen exposure   Sacral pain 2014   trauma ( fall)    Past Surgical History:  Procedure Laterality Date   BREAST BIOPSY Right 12/19/2015   BREAST LUMPECTOMY Right 01/20/2016   Procedure: RIGHT BREAST LUMPECTOMY;  Surgeon: Ovidio Kin, MD;  Location: [facility];  Service: General;  Laterality: Right;   BREAST LUMPECTOMY WITH RADIOACTIVE SEED LOCALIZATION Right 01/10/2016   Procedure: BREAST LUMPECTOMY WITH RADIOACTIVE SEED LOCALIZATION;   Surgeon: Ovidio Kin, MD;  Location: [facility];  Service: General;  Laterality: Right;   CERVICAL FUSION  07/30/2010   & Discectomy, Dr Venetia Maxon ( @ 3 levels)/has plate and 6 screws   MICRODISCECTOMY LUMBAR  09/18/2021   L5-S1   POLYPECTOMY     SEPTOPLASTY  1983   for septal deviation   TONSILLECTOMY     uterine ablation  2008   Dr Billy Coast   WISDOM TOOTH EXTRACTION      Current Outpatient Medications  Medication Sig Dispense Refill   albuterol (VENTOLIN HFA) 108 (90 Base) MCG/ACT inhaler Inhale 2 puffs into the lungs every 6 (six) hours as needed for wheezing or shortness of breath. 8 g 2   EPINEPHrine (EPIPEN 2-PAK) 0.3 mg/0.3 mL IJ SOAJ injection Use as directed for severe allergic reactions 2 each 2   fexofenadine-pseudoephedrine (ALLEGRA-D 24) 180-240 MG 24 hr tablet Take 1 tablet by mouth daily.     gabapentin (NEURONTIN) 100 MG capsule Take 1 capsule nightly x 3 nights.  Then increased by  every 3 nights.  If no improvement at , please call. 60 capsule 0   Probiotic Product ([facility]ON [facility]ALTH PO) Take by mouth. Take one pill daily     Turmeric (QC TUMERIC COMPLEX) 500 MG CAPS Take by mouth.     UNABLE TO  FIND Med Name: Allergy Shots weekly     No current facility-administered medications for this visit.    Family History  Problem Relation Age of Onset   Heart disease Mother        cardiac rest, epinephrine with Novocaine   Arthritis Mother    Diabetes Father        Type II, AO   Alcohol abuse Father    Depression Father    Prostate cancer Father 14   Lung cancer Father 34   Heart disease Father        chf   Heart disease Sister    Depression Brother    HIV Brother        AIDS   Cancer Paternal Aunt        probable ovarian   Other Maternal Grandmother        issues w/ breast in her 90s--may have been breast cancer   Colon cancer Maternal Grandfather        dx late 22s; w/ colostomy   COPD Paternal Grandmother    Alcohol abuse Paternal  Grandmother        smoker   Emphysema Paternal Grandmother    Bronchitis Paternal Grandmother    Heart attack Paternal Grandfather 71   Heart disease Paternal Grandfather    Alcohol abuse Paternal Grandfather        smoker   Stroke Neg Hx    Asthma Neg Hx    Allergic rhinitis Neg Hx    Angioedema Neg Hx    Eczema Neg Hx    Immunodeficiency Neg Hx    Urticaria Neg Hx    Colon polyps Neg Hx    Esophageal cancer Neg Hx    Rectal cancer Neg Hx    Stomach cancer Neg Hx     ROS: Constitutional: negative Genitourinary:negative  Exam:   BP 101/72 (BP Location: Right Arm, Patient Position: Sitting, Cuff Size: Normal)   Pulse (!) 56   Ht 5\' 7"  (1.702 m) Comment: Reported  Wt 154 lb 9.6 oz (70.1 kg)   LMP 04/25/2015 Comment: Uterine ablations  BMI 24.21 kg/m   Height: 5\' 7"  (170.2 cm) (Reported)  General appearance: alert, cooperative and appears stated age Head: Normocephalic, without obvious abnormality, atraumatic Neck: no adenopathy, supple, symmetrical, trachea midline and thyroid normal to inspection and palpation Lungs: clear to auscultation bilaterally Breasts: normal appearance, no masses or tenderness Heart: regular rate and rhythm Abdomen: soft, non-tender; bowel sounds normal; no masses,  no organomegaly Extremities: extremities normal, atraumatic, no cyanosis or edema Skin: Skin color, texture, turgor normal. No rashes or lesions Lymph nodes: Cervical, supraclavicular, and axillary nodes normal. No abnormal inguinal nodes palpated Neurologic: Grossly normal   Pelvic: External genitalia:  no lesions              Urethra:  normal appearing urethra with no masses, tenderness or lesions              Bartholins and Skenes: normal                 Vagina: normal appearing vagina with normal color and no discharge, no lesions              Cervix: no lesions              Pap taken: No. Bimanual Exam:  Uterus:  normal size, contour, position, consistency, mobility,  non-tender              Adnexa:  normal adnexa and no mass, fullness, tenderness               Rectovaginal: Confirms               Anus:  normal sphincter tone, no lesions  Chaperone, Ina Homes, CMA, was present for exam.  Assessment/Plan:

## 2022-08-10 NOTE — Patient Instructions (Addendum)
Estroven OTC.  For hot flashes.    Gabapentin 100mg  nightly Paroxetine 10mg  (paxil) [place]   Call [phone] to scheduled at the [facility], [address], [address].  [place], [place] [address]

## 2022-08-17 ENCOUNTER — Ambulatory Visit (INDEPENDENT_AMBULATORY_CARE_PROVIDER_SITE_OTHER): Payer: 59

## 2022-08-17 DIAGNOSIS — J309 Allergic rhinitis, unspecified: Secondary | ICD-10-CM | POA: Diagnosis not present

## 2022-08-26 ENCOUNTER — Ambulatory Visit (INDEPENDENT_AMBULATORY_CARE_PROVIDER_SITE_OTHER): Payer: 59

## 2022-08-26 DIAGNOSIS — J309 Allergic rhinitis, unspecified: Secondary | ICD-10-CM | POA: Diagnosis not present

## 2022-08-29 NOTE — Assessment & Plan Note (Signed)
hgba1c acceptable, minimize simple carbs. Increase exercise as tolerated.  

## 2022-08-29 NOTE — Assessment & Plan Note (Addendum)
Patient encouraged to maintain heart healthy diet, regular exercise, adequate sleep. Consider daily probiotics. Take medications as prescribed. Is due for colonoscopy but has been caring for her fatther who died of CHF on 08/13/20. She is ready to do it now referral placed.MGM 11/23 repeat in late 2024 or 2025. Pap 12/2020 repeat 2025-2026 sees Dr Hyacinth Meeker of GYN Follows with Dr Lenis Dickinson for dermatology. Given and reviewed copy of ACP documents from [employer] and encouraged to complete and return  Dexa

## 2022-08-31 ENCOUNTER — Encounter: Payer: Self-pay | Admitting: Family Medicine

## 2022-08-31 ENCOUNTER — Ambulatory Visit (INDEPENDENT_AMBULATORY_CARE_PROVIDER_SITE_OTHER): Payer: 59 | Admitting: Family Medicine

## 2022-08-31 VITALS — BP 105/74 | HR 54 | Temp 98.0°F | Resp 16 | Ht 66.0 in | Wt 156.4 lb

## 2022-08-31 DIAGNOSIS — Z23 Encounter for immunization: Secondary | ICD-10-CM | POA: Diagnosis not present

## 2022-08-31 DIAGNOSIS — J309 Allergic rhinitis, unspecified: Secondary | ICD-10-CM

## 2022-08-31 DIAGNOSIS — R000 Tachycardia, unspecified: Secondary | ICD-10-CM | POA: Insufficient documentation

## 2022-08-31 DIAGNOSIS — R739 Hyperglycemia, unspecified: Secondary | ICD-10-CM

## 2022-08-31 DIAGNOSIS — S8262XS Displaced fracture of lateral malleolus of left fibula, sequela: Secondary | ICD-10-CM

## 2022-08-31 DIAGNOSIS — R609 Edema, unspecified: Secondary | ICD-10-CM

## 2022-08-31 DIAGNOSIS — Z0001 Encounter for general adult medical examination with abnormal findings: Secondary | ICD-10-CM | POA: Diagnosis not present

## 2022-08-31 DIAGNOSIS — H1010 Acute atopic conjunctivitis, unspecified eye: Secondary | ICD-10-CM

## 2022-08-31 DIAGNOSIS — Z0000 Encounter for general adult medical examination without abnormal findings: Secondary | ICD-10-CM

## 2022-08-31 NOTE — Assessment & Plan Note (Signed)
Is receiving allergy shots and doing well even lives with a cat now

## 2022-08-31 NOTE — Assessment & Plan Note (Signed)
Slipped on some wet leaves wrestling with her puppy. Has largely healed although she has some edema at times on a long day. Encouraged elevation and compression and note if worsens

## 2022-08-31 NOTE — Assessment & Plan Note (Signed)
Has had three separate episodes where her Garmin notified her she had a heart rate of over 100 at rest. No associated symptoms but will update EKG and she will keep track of caffeine, alcohol, salt, stress etc prior to the episodes. If they escalate we will refer to cardiology for evaluation

## 2022-08-31 NOTE — Patient Instructions (Signed)
Preventive Care 40-58 Years Old, Female Preventive care refers to lifestyle choices and visits with your health care provider that can promote health and wellness. Preventive care visits are also called wellness exams. What can I expect for my preventive care visit? Counseling Your health care provider may ask you questions about your: Medical history, including: Past medical problems. Family medical history. Pregnancy history. Current health, including: Menstrual cycle. Method of birth control. Emotional well-being. Home life and relationship well-being. Sexual activity and sexual health. Lifestyle, including: Alcohol, nicotine or tobacco, and drug use. Access to firearms. Diet, exercise, and sleep habits. Work and work environment. Sunscreen use. Safety issues such as seatbelt and bike helmet use. Physical exam Your health care provider will check your: Height and weight. These may be used to calculate your BMI (body mass index). BMI is a measurement that tells if you are at a healthy weight. Waist circumference. This measures the distance around your waistline. This measurement also tells if you are at a healthy weight and may help predict your risk of certain diseases, such as type 2 diabetes and high blood pressure. Heart rate and blood pressure. Body temperature. Skin for abnormal spots. What immunizations do I need?  Vaccines are usually given at various ages, according to a schedule. Your health care provider will recommend vaccines for you based on your age, medical history, and lifestyle or other factors, such as travel or where you work. What tests do I need? Screening Your health care provider may recommend screening tests for certain conditions. This may include: Lipid and cholesterol levels. Diabetes screening. This is done by checking your blood sugar (glucose) after you have not eaten for a while (fasting). Pelvic exam and Pap test. Hepatitis B test. Hepatitis C  test. HIV (human immunodeficiency virus) test. STI (sexually transmitted infection) testing, if you are at risk. Lung cancer screening. Colorectal cancer screening. Mammogram. Talk with your health care provider about when you should start having regular mammograms. This may depend on whether you have a family history of breast cancer. BRCA-related cancer screening. This may be done if you have a family history of breast, ovarian, tubal, or peritoneal cancers. Bone density scan. This is done to screen for osteoporosis. Talk with your health care provider about your test results, treatment options, and if necessary, the need for more tests. Follow these instructions at home: Eating and drinking  Eat a diet that includes fresh fruits and vegetables, whole grains, lean protein, and low-fat dairy products. Take vitamin and mineral supplements as recommended by your health care provider. Do not drink alcohol if: Your health care provider tells you not to drink. You are pregnant, may be pregnant, or are planning to become pregnant. If you drink alcohol: Limit how much you have to 0-1 drink a day. Know how much alcohol is in your drink. In the [place] one drink equals one 12 oz bottle of beer (355 mL), one 5 oz glass of wine (148 mL), or one 1 oz glass of hard liquor (44 mL). Lifestyle Brush your teeth every morning and night with fluoride toothpaste. Floss one time each day. Exercise for at least 30 minutes 5 or more days each week. Do not use any products that contain nicotine or tobacco. These products include cigarettes, chewing tobacco, and vaping devices, such as e-cigarettes. If you need help quitting, ask your health care provider. Do not use drugs. If you are sexually active, practice safe sex. Use a condom or other form of protection to   prevent STIs. If you do not wish to become pregnant, use a form of birth control. If you plan to become pregnant, see your health care provider for a  prepregnancy visit. Take aspirin only as told by your health care provider. Make sure that you understand how much to take and what form to take. Work with your health care provider to find out whether it is safe and beneficial for you to take aspirin daily. Find healthy ways to manage stress, such as: Meditation, yoga, or listening to music. Journaling. Talking to a trusted person. Spending time with friends and family. Minimize exposure to UV radiation to reduce your risk of skin cancer. Safety Always wear your seat belt while driving or riding in a vehicle. Do not drive: If you have been drinking alcohol. Do not ride with someone who has been drinking. When you are tired or distracted. While texting. If you have been using any mind-altering substances or drugs. Wear a helmet and other protective equipment during sports activities. If you have firearms in your house, make sure you follow all gun safety procedures. Seek help if you have been physically or sexually abused. What's next? Visit your health care provider once a year for an annual wellness visit. Ask your health care provider how often you should have your eyes and teeth checked. Stay up to date on all vaccines. This information is not intended to replace advice given to you by your health care provider. Make sure you discuss any questions you have with your health care provider. Document Revised: 10/09/2020 Document Reviewed: 10/09/2020 Elsevier Patient Education  2023 [employer]  

## 2022-08-31 NOTE — Progress Notes (Signed)
Subjective:   By signing my name below, I, Barrett Shell, attest that this documentation has been prepared under the direction and in the presence of Bradd Canary, MD. 08/31/2022   Patient ID: Barbara Fuentes, female    DOB: [birth year], 57 y.o.   MRN: [id number]  Chief Complaint  Patient presents with   Annual Exam    Annual Exam    HPI Patient is in today for a comprehensive physical exam follow up on chronic medical concerns. No recent febrile illness or hospitalizations. Denies CP/palp/SOB/HA/congestion/fevers/GI or GU c/o. Taking meds as prescribed   Acute She denies any frequency, constipation, neuropathy, hearing loss or recent illnesses.   Heart rate Her watch at rest has been 100 bpm a couple of times when sleeping. Her normal heart rate is 60 bpm. She has had heart flutters in the past that would occur mostly in the evening.  Pulse Readings from Last 3 Encounters:  08/31/22 (!) 54  08/10/22 (!) 56  03/06/22 81    Right ankle  She broke her right ankle in December. It has healed since, but she still has not been able to continue her walks.   Vitamins She takes vitamin D daily, but no longer takes calcium daily due to stomach issues. She eats broccoli to try to increase calcium intake through diet.   Allergies She takes allergy shots and has found relief while receiving them.  Colonoscopy Last colonoscopy completed on 11/20/2020. Two 3 mm polyps in the cecum. One 4 mm polyp found at the hepatic flexure. 3 mm polyp found at the recto-sigmoid colon. Tortuous colon and internal hemorrhoids observed.  Pap Smear Last pap smear completed on 01/08/2022. Results are normal. Repeat in 3 years.  Mammogram Last mammogram completed on 03/04/2022. Results are normal. Repeat in 1 year.   Social history No changes in family history. Her mom is 77 years old and living.   Advanced directives  She has advanced directives. Medical advanced directives paperwork given to her during  this visit.   Immunizations She is UTD on her shingles immunizations. She is interested in receiving her tetanus immunization during this visit.   Diet  Exercise She has not been able to exercise as regularly due to breaking her right ankle.   Vision She is UTD on vision. He vision has been decreasing overtime.   Past Medical History:  Diagnosis Date   Abnormal Pap smear of cervix    before children   Alkaline phosphatase deficiency    28 in 9/11   Allergic state 08/23/2015   Arthritis    hip   Breast cancer of lower-outer quadrant of right female breast (HCC) 12/20/2015   right breast   Eustachian tube dysfunction 02/2014   H/O recurrent pneumonia 08/23/2015   History of chicken pox 08/23/2015   History of radiation therapy 03/05/16- 04/02/16   Right Breast treated to 40.05 Gy in 15 fractions & Right Breast boost to 10 Gy in 5 fractions.    Low BP    Personal history of radiation therapy    2017 right breast   Pneumonia    RAD (reactive airway disease)     FORMER smoker , RAD with RTI's, cat or allergen exposure   Sacral pain 2014   trauma ( fall)    Past Surgical History:  Procedure Laterality Date   BREAST BIOPSY Right 12/19/2015   BREAST LUMPECTOMY Right 01/20/2016   Procedure: RIGHT BREAST LUMPECTOMY;  Surgeon: Ovidio Kin, MD;  Location:  [facility];  Service: General;  Laterality: Right;   BREAST LUMPECTOMY WITH RADIOACTIVE SEED LOCALIZATION Right 01/10/2016   Procedure: BREAST LUMPECTOMY WITH RADIOACTIVE SEED LOCALIZATION;  Surgeon: Ovidio Kin, MD;  Location: [facility];  Service: General;  Laterality: Right;   CERVICAL FUSION  07/30/2010   & Discectomy, Dr Venetia Maxon ( @ 3 levels)/has plate and 6 screws   MICRODISCECTOMY LUMBAR  09/18/2021   L5-S1   POLYPECTOMY     SEPTOPLASTY  1983   for septal deviation   TONSILLECTOMY     uterine ablation  2008   Dr Billy Coast   WISDOM TOOTH EXTRACTION      Family History  Problem Relation Age of Onset   Heart  disease Mother        cardiac rest, epinephrine with Novocaine   Arthritis Mother    Diabetes Father        Type II, AO   Alcohol abuse Father    Depression Father    Prostate cancer Father 20   Lung cancer Father 60   Heart disease Father        chf   Heart disease Sister    Depression Brother    HIV Brother        AIDS   Cancer Paternal Aunt        probable ovarian   Other Maternal Grandmother        issues w/ breast in her 90s--may have been breast cancer   Colon cancer Maternal Grandfather        dx late 86s; w/ colostomy   COPD Paternal Grandmother    Alcohol abuse Paternal Grandmother        smoker   Emphysema Paternal Grandmother    Bronchitis Paternal Grandmother    Heart attack Paternal Grandfather 34   Heart disease Paternal Grandfather    Alcohol abuse Paternal Grandfather        smoker   Stroke Neg Hx    Asthma Neg Hx    Allergic rhinitis Neg Hx    Angioedema Neg Hx    Eczema Neg Hx    Immunodeficiency Neg Hx    Urticaria Neg Hx    Colon polyps Neg Hx    Esophageal cancer Neg Hx    Rectal cancer Neg Hx    Stomach cancer Neg Hx     Social History   Socioeconomic History   Marital status: Married    Spouse name: Not on file   Number of children: Not on file   Years of education: Not on file   Highest education level: Not on file  Occupational History   Occupation: IT sales professional   Tobacco Use   Smoking status: Former    Years: 28    Types: Cigarettes    Quit date: 06/18/2010    Years since quitting: 12.2   Smokeless tobacco: Never   Tobacco comments:    smoked age [age] up to 1 ppd  Vaping Use   Vaping Use: Never used  Substance and Sexual Activity   Alcohol use: Not Currently   Drug use: No   Sexual activity: Yes    Birth control/protection: Post-menopausal  Other Topics Concern   Not on file  Social History Narrative   Not on file   Social Determinants of Health   Financial Resource Strain: Not on file  Food Insecurity: Not on  file  Transportation Needs: Not on file  Physical Activity: Not on file  Stress: Not on file  Social Connections: Not on file  Intimate Partner Violence: Not on file    Outpatient Medications Prior to Visit  Medication Sig Dispense Refill   albuterol (VENTOLIN HFA) 108 (90 Base) MCG/ACT inhaler Inhale 2 puffs into the lungs every 6 (six) hours as needed for wheezing or shortness of breath. 8 g 2   EPINEPHrine (EPIPEN 2-PAK) 0.3 mg/0.3 mL IJ SOAJ injection Use as directed for severe allergic reactions 2 each 2   gabapentin (NEURONTIN) 100 MG capsule Take 1 capsule nightly x 3 nights.  Then increased by 100mg  every 3 nights.  If no improvement at 400mg , please call. 60 capsule 0   Probiotic Product (PHILLIPS COLON HEALTH PO) Take by mouth. Take one pill daily     Turmeric (QC TUMERIC COMPLEX) 500 MG CAPS Take by mouth.     UNABLE TO FIND Med Name: Allergy Shots weekly     fexofenadine-pseudoephedrine (ALLEGRA-D 24) 180-240 MG 24 hr tablet Take 1 tablet by mouth daily.     No facility-administered medications prior to visit.    Allergies  Allergen Reactions   Codeine     REACTION: nausea & vomiting   Procaine Hcl     REACTION: tachycardia in family members!    Review of Systems  HENT:  Negative for hearing loss.   Gastrointestinal:  Negative for constipation.  Genitourinary:  Negative for frequency.  Neurological:        (-) neuropathy       Objective:    Physical Exam Constitutional:      General: She is not in acute distress.    Appearance: Normal appearance.  HENT:     Head: Normocephalic and atraumatic.     Right Ear: Tympanic membrane, ear canal and external ear normal.     Left Ear: Tympanic membrane, ear canal and external ear normal.  Eyes:     Extraocular Movements: Extraocular movements intact.     Right eye: No nystagmus.     Left eye: No nystagmus.     Pupils: Pupils are equal, round, and reactive to light.  Cardiovascular:     Rate and Rhythm: Normal  rate and regular rhythm.     Heart sounds: Normal heart sounds. No murmur heard.    No gallop.     Comments: Skipped beat  Pulmonary:     Effort: Pulmonary effort is normal. No respiratory distress.     Breath sounds: Normal breath sounds. No wheezing or rales.  Abdominal:     General: Bowel sounds are normal. There is no distension.     Palpations: Abdomen is soft.     Tenderness: There is no abdominal tenderness. There is no guarding.  Musculoskeletal:     Cervical back: Normal range of motion.     Comments: (+) 5/5 strength in upper and lower extremities  Lymphadenopathy:     Cervical: No cervical adenopathy.  Skin:    General: Skin is warm.  Neurological:     Mental Status: She is alert and oriented to person, place, and time.     Deep Tendon Reflexes:     Reflex Scores:      Patellar reflexes are 2+ on the right side and 1+ on the left side.    Comments: Nerves intact  Psychiatric:        Mood and Affect: Mood normal.     BP 105/74 (BP Location: Right Arm, Patient Position: Sitting, Cuff Size: Normal)   Pulse (!) 54   Temp 98 F (36.7  C) (Oral)   Resp 16   Ht 5\' 6"  (1.676 m)   Wt 156 lb 6.4 oz (70.9 kg)   LMP 04/25/2015 Comment: Uterine ablations  SpO2 99%   BMI 25.24 kg/m  Wt Readings from Last 3 Encounters:  08/31/22 156 lb 6.4 oz (70.9 kg)  08/10/22 154 lb 9.6 oz (70.1 kg)  03/06/22 151 lb (68.5 kg)       Assessment & Plan:  Preventative health care Assessment & Plan: Patient encouraged to maintain heart healthy diet, regular exercise, adequate sleep. Consider daily probiotics. Take medications as prescribed. Is due for colonoscopy but has been caring for her fatther who died of CHF on 24-Aug-2020. She is ready to do it now referral placed.MGM 11/23 repeat in late 2024 or 2025. Pap 12/2020 repeat 2025-2026 sees Dr Hyacinth Meeker of GYN Follows with Dr Lenis Dickinson for dermatology. Given and reviewed copy of ACP documents from [facility] Secretary of State and encouraged to complete and  return  Dexa is ordered by GYN. Tetanus shot given today   Hyperglycemia Assessment & Plan: hgba1c acceptable, minimize simple carbs. Increase exercise as tolerated.   Orders: -     Hemoglobin A1c; Future -     Lipid panel; Future  Alkaline phosphatase deficiency -     Comprehensive metabolic panel; Future  Allergic rhinitis, unspecified seasonality, unspecified trigger Assessment & Plan: Is receiving allergy shots and doing well even lives with a cat now  Orders: -     CBC with Differential/Platelet; Future  Seasonal allergic conjunctivitis  Tachycardia Assessment & Plan: Has had three separate episodes where her Garmin notified her she had a heart rate of over 100 at rest. No associated symptoms but will update EKG and she will keep track of caffeine, alcohol, salt, stress etc prior to the episodes. If they escalate we will refer to cardiology for evaluation  Orders: -     EKG 12-Lead -     TSH; Future  Closed displaced fracture of lateral malleolus of left fibula, sequela Assessment & Plan: Slipped on some wet leaves wrestling with her puppy. Has largely healed although she has some edema at times on a long day. Encouraged elevation and compression and note if worsens   Need for Tdap vaccination -     Tdap vaccine greater than or equal to 7yo IM   I, Danise Edge, MD, personally preformed the services described in this documentation.  All medical record entries made by the scribe were at my direction and in my presence.  I have reviewed the chart and discharge instructions (if applicable) and agree that the record reflects my personal performance and is accurate and complete. 08/31/2022  Danise Edge, MD  [facility] as a scribe for Danise Edge, MD.,have documented all relevant documentation on the behalf of Danise Edge, MD,as directed by  Danise Edge, MD while in the presence of Danise Edge, MD.

## 2022-09-03 ENCOUNTER — Other Ambulatory Visit: Payer: 59

## 2022-09-10 ENCOUNTER — Other Ambulatory Visit (INDEPENDENT_AMBULATORY_CARE_PROVIDER_SITE_OTHER): Payer: 59

## 2022-09-10 DIAGNOSIS — R739 Hyperglycemia, unspecified: Secondary | ICD-10-CM | POA: Diagnosis not present

## 2022-09-10 DIAGNOSIS — J309 Allergic rhinitis, unspecified: Secondary | ICD-10-CM

## 2022-09-10 DIAGNOSIS — R000 Tachycardia, unspecified: Secondary | ICD-10-CM

## 2022-09-10 LAB — COMPREHENSIVE METABOLIC PANEL
ALT: 20 U/L (ref 0–35)
AST: 20 U/L (ref 0–37)
Albumin: 3.9 g/dL (ref 3.5–5.2)
Alkaline Phosphatase: 41 U/L (ref 39–117)
BUN: 8 mg/dL (ref 6–23)
CO2: 28 mEq/L (ref 19–32)
Calcium: 9.3 mg/dL (ref 8.4–10.5)
Chloride: 104 mEq/L (ref 96–112)
Creatinine, Ser: 0.61 mg/dL (ref 0.40–1.20)
GFR: 98.96 mL/min (ref >60.00)
Glucose, Bld: 93 mg/dL (ref 70–99)
Potassium: 4.2 mEq/L (ref 3.5–5.1)
Sodium: 140 mEq/L (ref 135–145)
Total Bilirubin: 0.6 mg/dL (ref 0.2–1.2)
Total Protein: 6.3 g/dL (ref 6.0–8.3)

## 2022-09-10 LAB — LIPID PANEL
Cholesterol: 180 mg/dL (ref 0–200)
HDL: 70.7 mg/dL (ref >39.00)
LDL Cholesterol: 96 mg/dL (ref 0–99)
NonHDL: 109.09
Total CHOL/HDL Ratio: 3
Triglycerides: 64 mg/dL (ref 0.0–149.0)
VLDL: 12.8 mg/dL (ref 0.0–40.0)

## 2022-09-10 LAB — CBC WITH DIFFERENTIAL/PLATELET
Basophils Absolute: 0.1 10*3/uL (ref 0.0–0.1)
Basophils Relative: 1.1 % (ref 0.0–3.0)
Eosinophils Absolute: 0.2 10*3/uL (ref 0.0–0.7)
Eosinophils Relative: 3.2 % (ref 0.0–5.0)
HCT: 38.5 % (ref 36.0–46.0)
Hemoglobin: 12.8 g/dL (ref 12.0–15.0)
Lymphocytes Relative: 30.7 % (ref 12.0–46.0)
Lymphs Abs: 2.1 10*3/uL (ref 0.7–4.0)
MCHC: 33.3 g/dL (ref 30.0–36.0)
MCV: 90.6 fl (ref 78.0–100.0)
Monocytes Absolute: 0.5 10*3/uL (ref 0.1–1.0)
Monocytes Relative: 7.2 % (ref 3.0–12.0)
Neutro Abs: 4 10*3/uL (ref 1.4–7.7)
Neutrophils Relative %: 57.8 % (ref 43.0–77.0)
Platelets: 345 10*3/uL (ref 150.0–400.0)
RBC: 4.24 Mil/uL (ref 3.87–5.11)
RDW: 13.8 % (ref 11.5–15.5)
WBC: 7 10*3/uL (ref 4.0–10.5)

## 2022-09-10 LAB — HEMOGLOBIN A1C: Hgb A1c MFr Bld: 5.7 % (ref 4.6–6.5)

## 2022-09-10 LAB — TSH: TSH: 4.66 u[IU]/mL (ref 0.35–5.50)

## 2022-09-22 ENCOUNTER — Ambulatory Visit (INDEPENDENT_AMBULATORY_CARE_PROVIDER_SITE_OTHER): Payer: 59

## 2022-09-22 DIAGNOSIS — J309 Allergic rhinitis, unspecified: Secondary | ICD-10-CM

## 2022-10-20 ENCOUNTER — Ambulatory Visit (INDEPENDENT_AMBULATORY_CARE_PROVIDER_SITE_OTHER): Payer: 59

## 2022-10-20 DIAGNOSIS — J309 Allergic rhinitis, unspecified: Secondary | ICD-10-CM

## 2022-11-06 DIAGNOSIS — J3081 Allergic rhinitis due to animal (cat) (dog) hair and dander: Secondary | ICD-10-CM | POA: Diagnosis not present

## 2022-11-06 NOTE — Progress Notes (Signed)
EXP 11/06/23

## 2022-11-18 ENCOUNTER — Ambulatory Visit (INDEPENDENT_AMBULATORY_CARE_PROVIDER_SITE_OTHER): Payer: 59

## 2022-11-18 DIAGNOSIS — J309 Allergic rhinitis, unspecified: Secondary | ICD-10-CM | POA: Diagnosis not present

## 2022-12-15 ENCOUNTER — Ambulatory Visit (INDEPENDENT_AMBULATORY_CARE_PROVIDER_SITE_OTHER): Payer: 59

## 2022-12-15 DIAGNOSIS — J309 Allergic rhinitis, unspecified: Secondary | ICD-10-CM

## 2023-01-18 ENCOUNTER — Ambulatory Visit (INDEPENDENT_AMBULATORY_CARE_PROVIDER_SITE_OTHER): Payer: 59

## 2023-01-18 DIAGNOSIS — J309 Allergic rhinitis, unspecified: Secondary | ICD-10-CM | POA: Diagnosis not present

## 2023-02-15 ENCOUNTER — Ambulatory Visit (INDEPENDENT_AMBULATORY_CARE_PROVIDER_SITE_OTHER): Payer: 59

## 2023-02-15 DIAGNOSIS — J309 Allergic rhinitis, unspecified: Secondary | ICD-10-CM

## 2023-02-18 ENCOUNTER — Other Ambulatory Visit: Payer: 59

## 2023-03-04 ENCOUNTER — Other Ambulatory Visit: Payer: Self-pay | Admitting: Family Medicine

## 2023-03-04 DIAGNOSIS — Z1231 Encounter for screening mammogram for malignant neoplasm of breast: Secondary | ICD-10-CM

## 2023-03-23 ENCOUNTER — Ambulatory Visit
Admission: RE | Admit: 2023-03-23 | Discharge: 2023-03-23 | Disposition: A | Discharge status: Home / Self Care | Payer: 59 | Source: Ambulatory Visit | Attending: Family Medicine | Admitting: Family Medicine

## 2023-03-23 DIAGNOSIS — Z1231 Encounter for screening mammogram for malignant neoplasm of breast: Secondary | ICD-10-CM

## 2023-03-29 ENCOUNTER — Ambulatory Visit (INDEPENDENT_AMBULATORY_CARE_PROVIDER_SITE_OTHER): Payer: 59

## 2023-03-29 DIAGNOSIS — J309 Allergic rhinitis, unspecified: Secondary | ICD-10-CM

## 2023-04-29 ENCOUNTER — Ambulatory Visit (INDEPENDENT_AMBULATORY_CARE_PROVIDER_SITE_OTHER): Payer: 59

## 2023-04-29 DIAGNOSIS — J309 Allergic rhinitis, unspecified: Secondary | ICD-10-CM

## 2023-05-31 ENCOUNTER — Ambulatory Visit: Payer: Self-pay

## 2023-05-31 ENCOUNTER — Ambulatory Visit (INDEPENDENT_AMBULATORY_CARE_PROVIDER_SITE_OTHER): Payer: 59

## 2023-05-31 DIAGNOSIS — J309 Allergic rhinitis, unspecified: Secondary | ICD-10-CM | POA: Diagnosis not present

## 2023-06-28 ENCOUNTER — Ambulatory Visit (INDEPENDENT_AMBULATORY_CARE_PROVIDER_SITE_OTHER): Payer: Self-pay

## 2023-06-28 DIAGNOSIS — J309 Allergic rhinitis, unspecified: Secondary | ICD-10-CM | POA: Diagnosis not present

## 2023-07-05 ENCOUNTER — Encounter: Payer: Self-pay | Admitting: Internal Medicine

## 2023-07-05 ENCOUNTER — Ambulatory Visit (INDEPENDENT_AMBULATORY_CARE_PROVIDER_SITE_OTHER): Admitting: Internal Medicine

## 2023-07-05 VITALS — BP 102/72 | HR 58 | Temp 98.1°F | Resp 17 | Wt 161.3 lb

## 2023-07-05 DIAGNOSIS — H1013 Acute atopic conjunctivitis, bilateral: Secondary | ICD-10-CM

## 2023-07-05 DIAGNOSIS — J302 Other seasonal allergic rhinitis: Secondary | ICD-10-CM

## 2023-07-05 DIAGNOSIS — J3089 Other allergic rhinitis: Secondary | ICD-10-CM | POA: Diagnosis not present

## 2023-07-05 DIAGNOSIS — J4520 Mild intermittent asthma, uncomplicated: Secondary | ICD-10-CM | POA: Diagnosis not present

## 2023-07-05 DIAGNOSIS — J309 Allergic rhinitis, unspecified: Secondary | ICD-10-CM

## 2023-07-05 NOTE — Progress Notes (Signed)
 Follow Up Note  RE: Barbara Fuentes MRN: [id number] DOB: December 31, 1965 Date of Office Visit: 07/05/2023  Referring provider: Bradd Canary, MD Primary care provider: Bradd Canary, MD  Chief Complaint: Follow-up (Pt states she's been doing well.), Allergic Rhinitis , and Asthma  History of Present Illness: I had the pleasure of seeing Barbara Fuentes for a follow up visit at the [facility] on 07/05/2023. She is a 58 y.o. female, who is being followed for intermittent asthma, allergic rhinitis . Her previous allergy office visit was on 01/28/22 with Dr. Marlynn Perking. Today is a regular follow up visit.  History obtained from patient, chart review.  Allergic Rhinitis - Medical therapy: allegra, flonase as needed during spring tree season  - Symptoms: rare nasal congestion during tree season, otherwise well controlled  - Adverse effects of medications:  - Immunotherapy: vial 1 (dm-animals-cr-mold), vial 2 (grass-tree) - Large Local Reactions: denies  - Systemic Reactions: denies  - Beta Blockers: denies  - History of Reflux denies  - History of Sinus Surgery denies  - Interested in rapid Red vial build up   ASTHMA - Medical therapy: NONE - Rescue inhaler use: has not needed inhaler in past few months  - Symptoms: denies  chest tightness, wheezing - Exacerbation history: 0 ABX for respiratory illness since last visit, 0 OCS, 0ED, 0 UC visits in the past year  - ACT: 25 /25 (5/5/5/5/5)  - Adverse effects of medication: denies  - Previous FEV1: FEV1: 2.27L, 80% predicted  - Biologic Labs not needed     Assessment and Plan: Barbara Fuentes is a 58 y.o. female with: Seasonal and perennial allergic rhinoconjunctivitis of both eyes - Plan: Allergens, Zone 2  Mild intermittent asthma in adult without complication Plan: Patient Instructions  Seasonal and perennial rhinitis:well controlled  - Continue Avoidance measures - AIT PLAN:  continue allergy injections and carry epipen  on injection days   -okay to rebuild Red at the accelerated schedule   - will get labs today to see about stopping injections  - Continue with: Allegra (fexofenadine) 180mg  table once daily and Flonase (fluticasone) one spray per nostril daily as needed  - You can use an extra dose of the antihistamine, if needed, for breakthrough symptoms.  - Consider nasal saline rinses 1-2 times daily to remove allergens from the nasal cavities as well as help with mucous clearance (this is especially helpful to do before the nasal sprays are given)  Mild Intermittent  Asthma:  - Daily controller medication(s):  None needs  - Prior to physical activity: albuterol 2 puffs 10-15 minutes before physical activity. - Rescue medications: albuterol 4 puffs every 4-6 hours as needed - Asthma control goals:  * Full participation in all desired activities (may need albuterol before activity) * Albuterol use two time or less a week on average (not counting use with activity) * Cough interfering with sleep two time or less a month * Oral steroids no more than once a year * No hospitalizations   Follow up: 12 months   Thank you so much for letting me partake in your care today.  Don't hesitate to reach out if you have any additional concerns!  Ferol Luz, MD  [facility] [place] [place] No follow-ups on file.  No orders of the defined types were placed in this encounter.   Lab Orders         Allergens, Zone 2     Diagnostics: None  done   Results interpreted by myself during this encounter and discussed with patient/family.   Medication List:  Current Outpatient Medications  Medication Sig Dispense Refill   albuterol (VENTOLIN HFA) 108 (90 Base) MCG/ACT inhaler Inhale 2 puffs into the lungs every 6 (six) hours as needed for wheezing or shortness of breath. 8 g 2   EPINEPHrine (EPIPEN 2-PAK) 0.3 mg/0.3 mL IJ SOAJ injection Use as directed for severe allergic reactions 2 each 2    Probiotic Product (PHILLIPS COLON HEALTH PO) Take by mouth. Take one pill daily     Turmeric (QC TUMERIC COMPLEX) 500 MG CAPS Take by mouth.     UNABLE TO FIND Med Name: Allergy Shots weekly     No current facility-administered medications for this visit.   Allergies: Allergies  Allergen Reactions   Codeine     REACTION: nausea & vomiting   Procaine Hcl     REACTION: tachycardia in family members!   I reviewed her past medical history, social history, family history, and environmental history and no significant changes have been reported from her previous visit.  ROS: All others negative except as noted per HPI.   Objective: BP 102/72   Pulse (!) 58   Temp 98.1 F (36.7 C) (Temporal)   Resp 17   Wt 161 lb 4.8 oz (73.2 kg)   LMP 04/25/2015 Comment: Uterine ablations  SpO2 100%   BMI 26.03 kg/m  Body mass index is 26.03 kg/m. General Appearance:  Alert, cooperative, no distress, appears stated age  Head:  Normocephalic, without obvious abnormality, atraumatic  Eyes:  Conjunctiva clear, EOM's intact  Nose: Nares normal, normal mucosa, no visible anterior polyps, and septum midline  Throat: Lips, tongue normal; teeth and gums normal, no tonsillar exudate and + cobblestoning  Neck: Supple, symmetrical  Lungs:   clear to auscultation bilaterally, Respirations unlabored, no coughing  Heart:  regular rate and rhythm and no murmur, Appears well perfused  Extremities: No edema  Skin: Skin color, texture, turgor normal, no rashes or lesions on visualized portions of skin   Neurologic: No gross deficits   Previous notes and tests were reviewed. The plan was reviewed with the patient/family, and all questions/concerned were addressed.  It was my pleasure to see Barbara Fuentes today and participate in her care. Please feel free to contact me with any questions or concerns.  Sincerely,  Ferol Luz, MD  Allergy & Immunology  [facility]  Office: [phone]

## 2023-07-05 NOTE — Patient Instructions (Signed)
 Seasonal and perennial rhinitis:well controlled  - Continue Avoidance measures - AIT PLAN:  continue allergy injections and carry epipen on injection days   -okay to rebuild Red at the accelerated schedule   - will get labs today to see about stopping injections  - Continue with: Allegra (fexofenadine) 180mg  table once daily and Flonase (fluticasone) one spray per nostril daily as needed  - You can use an extra dose of the antihistamine, if needed, for breakthrough symptoms.  - Consider nasal saline rinses 1-2 times daily to remove allergens from the nasal cavities as well as help with mucous clearance (this is especially helpful to do before the nasal sprays are given)  Mild Intermittent  Asthma:  - Daily controller medication(s):  None needs  - Prior to physical activity: albuterol 2 puffs 10-15 minutes before physical activity. - Rescue medications: albuterol 4 puffs every 4-6 hours as needed - Asthma control goals:  * Full participation in all desired activities (may need albuterol before activity) * Albuterol use two time or less a week on average (not counting use with activity) * Cough interfering with sleep two time or less a month * Oral steroids no more than once a year * No hospitalizations   Follow up: 12 months   Thank you so much for letting me partake in your care today.  Don't hesitate to reach out if you have any additional concerns!  Ferol Luz, MD  [facility] [place], [place]

## 2023-07-09 LAB — ALLERGENS, ZONE 2
Alternaria Alternata IgE: <0.1 kU/L
Amer Sycamore IgE Qn: <0.1 kU/L
Aspergillus Fumigatus IgE: <0.1 kU/L
Bahia Grass IgE: <0.1 kU/L
Bermuda Grass IgE: <0.1 kU/L
Cat Dander IgE: 3.76 kU/L — AB
Cedar, Mountain IgE: <0.1 kU/L
Cladosporium Herbarum IgE: <0.1 kU/L
Cockroach, American IgE: <0.1 kU/L
Common Silver Birch IgE: <0.1 kU/L
D Farinae IgE: <0.1 kU/L
D Pteronyssinus IgE: <0.1 kU/L
Dog Dander IgE: 6.38 kU/L — AB
Elm, American IgE: <0.1 kU/L
Hickory, White IgE: <0.1 kU/L
Johnson Grass IgE: <0.1 kU/L
Maple/Box Elder IgE: <0.1 kU/L
Mucor Racemosus IgE: <0.1 kU/L
Mugwort IgE Qn: <0.1 kU/L
Nettle IgE: <0.1 kU/L
Oak, White IgE: <0.1 kU/L
Penicillium Chrysogen IgE: <0.1 kU/L
Pigweed, Rough IgE: <0.1 kU/L
Plantain, English IgE: <0.1 kU/L
Ragweed, Short IgE: <0.1 kU/L
Sheep Sorrel IgE Qn: <0.1 kU/L
Stemphylium Herbarum IgE: <0.1 kU/L
Sweet gum IgE RAST Ql: <0.1 kU/L
Timothy Grass IgE: <0.1 kU/L
White Mulberry IgE: <0.1 kU/L

## 2023-07-12 ENCOUNTER — Telehealth: Payer: Self-pay

## 2023-07-12 NOTE — Telephone Encounter (Signed)
 Patient called inquiring about next injection due date she saw Dr Marlynn Perking on 3/10 for faster pace. She started new red vial and is on 0.1mg  she is due until she reaches maitenance 0.4 freeze dose then monthly. Verified with Damita in injection room. Pt informed. No other questions or concerns.

## 2023-07-15 ENCOUNTER — Ambulatory Visit (INDEPENDENT_AMBULATORY_CARE_PROVIDER_SITE_OTHER): Payer: Self-pay

## 2023-07-15 DIAGNOSIS — J309 Allergic rhinitis, unspecified: Secondary | ICD-10-CM

## 2023-07-16 NOTE — Progress Notes (Signed)
 Blood work positive to cat and dog.  Recommend continuing allergy injections.

## 2023-07-20 ENCOUNTER — Ambulatory Visit (INDEPENDENT_AMBULATORY_CARE_PROVIDER_SITE_OTHER): Payer: Self-pay

## 2023-07-20 DIAGNOSIS — J309 Allergic rhinitis, unspecified: Secondary | ICD-10-CM | POA: Diagnosis not present

## 2023-08-17 ENCOUNTER — Ambulatory Visit (INDEPENDENT_AMBULATORY_CARE_PROVIDER_SITE_OTHER)

## 2023-08-17 DIAGNOSIS — J309 Allergic rhinitis, unspecified: Secondary | ICD-10-CM | POA: Diagnosis not present

## 2023-09-02 ENCOUNTER — Encounter: Payer: 59 | Admitting: Family Medicine

## 2023-09-13 ENCOUNTER — Ambulatory Visit (INDEPENDENT_AMBULATORY_CARE_PROVIDER_SITE_OTHER): Payer: Self-pay

## 2023-09-13 DIAGNOSIS — J309 Allergic rhinitis, unspecified: Secondary | ICD-10-CM

## 2023-09-14 ENCOUNTER — Encounter: Admitting: Student

## 2023-09-14 ENCOUNTER — Encounter: Payer: 59 | Admitting: Family Medicine

## 2023-09-14 ENCOUNTER — Encounter: Payer: Self-pay | Admitting: Student

## 2023-09-14 DIAGNOSIS — Z0000 Encounter for general adult medical examination without abnormal findings: Secondary | ICD-10-CM | POA: Insufficient documentation

## 2023-09-14 DIAGNOSIS — Z8781 Personal history of (healed) traumatic fracture: Secondary | ICD-10-CM | POA: Insufficient documentation

## 2023-09-14 NOTE — Assessment & Plan Note (Addendum)
 Patient encouraged to maintain heart healthy diet, regular exercise, adequate sleep. She is taking daily probiotic. Take medications as prescribed. Follows with Dr Raynaldo Call for dermatology. HM Colonoscopy due 2027. Hx of of right breast, cancer post lumpectomy 2017 followed by radiation. Last [facility] 02/2023 repeat annually. Due 2025. Referral sent.  Last Pap 12/2020. Last result normal. Repeat 2027 per GYN- sees Dr Annabell Key of GYN.  DEXA scan ordered.  Referral sent for LDCT Screening due to Hx of tobacco use.

## 2023-09-14 NOTE — Progress Notes (Signed)
 Subjective:     Patient ID: Barbara Fuentes, female    DOB: 1965-05-01, 58 y.o.   MRN: [id number]  Chief Complaint  Patient presents with   Annual Exam    Doing well , no complaints .     HPI  Barbara Fuentes is a 58 y.o. female who presents today for a complete physical exam. She reports consuming a low fat, low sodium, and watching carbs diet. Walking and hiking often, works with horses. She generally feels fairly well. She reports sleeping well. She does not have additional problems to discuss today.   Exercise: Hiking and staying active Vision: Annually Dental: Se[facility] annually [facility]   IMMUNIZATIONS:   Pneumonia vaccine: due  SCREENING: -Pap: Last pap smear completed on 01/08/2021. Results are normal. Repeat 2027 per GYN- sees Dr Annabell Key of GYN. -Mammogram: Hx of of right breast, cancer post lumpectomy 2017 followed by radiation. Last Mgm- 02/2023, repeat annually.   - Bone Density DEXA: - Colorectal cancer screening: Due 2027 - Lung Cancer Screening: Hx of Tobacco use- LDCT screening due.   Hx of allergic state- Sees Immunotherapy and gets allergy shots- doing well with this. Has not had to use albuterol .   Patient denies fever, chills, SOB, CP, palpitations, dyspnea, edema, HA, vision changes, N/V/D, abdominal pain, urinary symptoms, rash, weight changes, and recent illness or hospitalizations.   History of Present Illness            Health Maintenance Due  Topic Date Due   HIV Screening  Never done   Pneumococcal Vaccine 2-49 Years old (1 of 2 - PCV) Never done    Past Medical History:  Diagnosis Date   Abnormal Pap smear of cervix    before children   Alkaline phosphatase deficiency    28 in 9/11   Allergic state 08/23/2015   Arthritis    hip   Breast cancer of lower-outer quadrant of right female breast (HCC) 12/20/2015   right breast   Eustachian tube dysfunction 02/2014   H/O recurrent pneumonia  08/23/2015   History of chicken pox 08/23/2015   History of radiation therapy 03/05/16- 04/02/16   Right Breast treated to 40.05 Gy in 15 fractions & Right Breast boost to 10 Gy in 5 fractions.    Low BP    Personal history of radiation therapy    2017 right breast   Pneumonia    RAD (reactive airway disease)     FORMER smoker , RAD with RTI's, cat or allergen exposure   Sacral pain 2014   trauma ( fall)    Past Surgical History:  Procedure Laterality Date   BREAST BIOPSY Right 12/19/2015   BREAST LUMPECTOMY Right 01/20/2016   Procedure: RIGHT BREAST LUMPECTOMY;  Surgeon: Juanita Norlander, MD;  Location: [facility];  Service: General;  Laterality: Right;   BREAST LUMPECTOMY WITH RADIOACTIVE SEED LOCALIZATION Right 01/10/2016   Procedure: BREAST LUMPECTOMY WITH RADIOACTIVE SEED LOCALIZATION;  Surgeon: Juanita Norlander, MD;  Location: [facility];  Service: General;  Laterality: Right;   CERVICAL FUSION  07/30/2010   & Discectomy, Dr Nigel Bart ( @ 3 levels)/has plate and 6 screws   MICRODISCECTOMY LUMBAR  09/18/2021   L5-S1   POLYPECTOMY     SEPTOPLASTY  1983   for septal deviation   TONSILLECTOMY     uterine ablation  2008   Dr Harless Lien   WISDOM TOOTH EXTRACTION      Family History  Problem Relation  Age of Onset   Heart disease Mother        cardiac rest, epinephrine  with Novocaine   Arthritis Mother    Diabetes Father        Type II, AO   Alcohol abuse Father    Depression Father    Prostate cancer Father 24   Lung cancer Father 53   Heart disease Father        chf   Heart failure Father    Heart disease Sister    Depression Brother    HIV Brother        AIDS   Other Maternal Grandmother        issues w/ breast in her 90s--may have been breast cancer   Colon cancer Maternal Grandfather        dx late 80s; w/ colostomy   COPD Paternal Grandmother    Alcohol abuse Paternal Grandmother        smoker   Emphysema Paternal Grandmother    Bronchitis Paternal Grandmother     Heart attack Paternal Grandfather 78   Heart disease Paternal Grandfather    Alcohol abuse Paternal Grandfather        smoker   Cancer Paternal Aunt        probable ovarian   Stroke Neg Hx    Asthma Neg Hx    Allergic rhinitis Neg Hx    Angioedema Neg Hx    Eczema Neg Hx    Immunodeficiency Neg Hx    Urticaria Neg Hx    Colon polyps Neg Hx    Esophageal cancer Neg Hx    Rectal cancer Neg Hx    Stomach cancer Neg Hx     Social History   Socioeconomic History   Marital status: Married    Spouse name: Not on file   Number of children: Not on file   Years of education: Not on file   Highest education level: Not on file  Occupational History   Occupation: IT sales professional   Tobacco Use   Smoking status: Former    Current packs/day: 0.00    Types: Cigarettes    Start date: 06/18/1982    Quit date: 06/18/2010    Years since quitting: 13.2   Smokeless tobacco: Never   Tobacco comments:    smoked age [age] up to 1 ppd  Vaping Use   Vaping status: Never Used  Substance and Sexual Activity   Alcohol use: Not Currently    Comment: beer 2-3 days per week, 1-2 beers- not every week   Drug use: No   Sexual activity: Yes    Birth control/protection: Post-menopausal  Other Topics Concern   Not on file  Social History Narrative   ETOH: Minimal 1-2/ days per week (beer)   Social Drivers of Health   Financial Resource Strain: Not on file  Food Insecurity: Not on file  Transportation Needs: Not on file  Physical Activity: Not on file  Stress: No Stress Concern Present (09/10/2021)   Received from [facility]   [employer] [facility] - Occupational Stress Questionnaire    Feeling of Stress : Not at all  Social Connections: Unknown (09/03/2021)   Received from [facility], [facility]   Social Network    Social Network: Not on file  Intimate Partner Violence: Unknown (08/01/2021)   Received [facility], [facility]   HITS     Physically Hurt: Not on file    Insult or Talk Down  To: Not on file    Threaten Physical Harm: Not on file    Scream or Curse: Not on file    Outpatient Medications Prior to Visit  Medication Sig Dispense Refill   albuterol  (VENTOLIN  HFA) 108 (90 Base) MCG/ACT inhaler Inhale 2 puffs into the lungs every 6 (six) hours as needed for wheezing or shortness of breath. 8 g 2   EPINEPHrine  (EPIPEN  2-PAK) 0.3 mg/0.3 mL IJ SOAJ injection Use as directed for severe allergic reactions 2 each 2   Probiotic Product (PHILLIPS COLON HEALTH PO) Take by mouth. Take one pill daily     Turmeric (QC TUMERIC COMPLEX) 500 MG CAPS Take by mouth.     UNABLE TO FIND Med Name: Allergy Shots weekly     No facility-administered medications prior to visit.    Allergies  Allergen Reactions   Codeine     REACTION: nausea & vomiting   Procaine Hcl     REACTION: tachycardia in family members!    ROS See HPI    Objective:     Physical Exam  Physical Exam Vitals reviewed.  Constitutional:      Appearance: Normal appearance. She is normal weight.  HENT:     Head: Normocephalic and atraumatic.     Right Ear: Tympanic membrane normal.     Left Ear: Tympanic membrane normal.     Nose: Nose normal.     Mouth/Throat:     Mouth: Mucous membranes are moist.     Pharynx: Oropharynx is clear.  Eyes:     Extraocular Movements: Extraocular movements intact.     Conjunctiva/sclera: Conjunctivae normal.     Pupils: Pupils are equal, round, and reactive to light.  Cardiovascular:     Rate and Rhythm: Normal rate and regular rhythm.     Pulses: Normal pulses.     Heart sounds: Normal heart sounds.  Pulmonary:     Effort: Pulmonary effort is normal.     Breath sounds: Normal breath sounds.  Abdominal:     General: Abdomen is flat. Bowel sounds are normal. There is no distension.     Palpations: Abdomen is soft. There is no mass.     Tenderness: There is no abdominal tenderness. There is no guarding or  rebound.  Musculoskeletal:        General: Normal range of motion.     Cervical back: Normal range of motion and neck supple.     Right lower leg: No edema.     Left lower leg: No edema.  Skin:    General: Skin is warm.     Capillary Refill: Capillary refill takes less than 2 seconds.  Neurological:     General: No focal deficit present.     Mental Status: She is alert and oriented to person, place, and time. Mental status is at baseline.  Psychiatric:        Mood and Affect: Mood normal.        Behavior: Behavior normal.        Thought Content: Thought content normal.        Judgment: Judgment normal.    BP 110/60   Pulse 66   Temp 98.3 F (36.8 C)   Resp 18   Ht 5\' 6"  (1.676 m)   Wt 164 lb 9.6 oz (74.7 kg)   LMP 04/25/2015 Comment: Uterine ablations  SpO2 99%   BMI 26.57 kg/m  Wt Readings from Last 3 Encounters:  09/15/23 164 lb 9.6 oz (74.7 kg)  07/05/23 161 lb 4.8 oz (73.2 kg)  08/31/22 156 lb 6.4 oz (70.9 kg)       Assessment & Plan:   Problem List Items Addressed This Visit     Allergic rhinitis   Is receiving allergy shots and doing well even lives with a cat now      Relevant Orders   CBC with Differential/Platelet   Annual physical exam - Primary   Relevant Orders   CBC with Differential/Platelet   Comprehensive metabolic panel with GFR   Lipid panel   TSH   History of fracture due to fall   DEXA scan ordered. Encouraged patient ensure adequate intake of calcium and Vitamin D to maintain bone health and decrease fracture risk. Vitamin D check today.       Relevant Orders   VITAMIN D 25 Hydroxy (Vit-D Deficiency, Fractures)   DG Bone Density   History of tobacco abuse   Referral placed for LDCT Lung Ca Screening      Relevant Orders   CT CHEST LUNG CA SCREEN LOW DOSE W/O CM   Postmenopausal estrogen deficiency   DEXA scan ordered to assess bone density and evaluate fracture risk. Referral sent. Discussed the purpose of the scan as a tool to  better understand overall bone health. Patient verbalized understanding and intends to proceed with the scan      Relevant Orders   VITAMIN D 25 Hydroxy (Vit-D Deficiency, Fractures)   DG Bone Density   Preventative health care   Patient encouraged to maintain heart healthy diet, regular exercise, adequate sleep. She is taking daily probiotic. Take medications as prescribed. Follows with Dr Raynaldo Call for dermatology. HM Colonoscopy due 2027. Hx of of right breast, cancer post lumpectomy 2017 followed by radiation. Last [facility] 02/2023 repeat annually. Due 2025. Referral sent.  Last Pap 12/2020. Last result normal. Repeat 2027 per GYN- sees Dr Annabell Key of GYN.  DEXA scan ordered.  Referral sent for LDCT Screening due to Hx of tobacco use.        Relevant Orders   MM 3D SCREENING MAMMOGRAM BILATERAL BREAST   DG Bone Density   HM HIV SCREENING LAB     I am having Barbara Fuentes maintain her Probiotic Product (PHILLIPS COLON HEALTH PO), EPINEPHrine , UNABLE TO FIND, Turmeric, and albuterol .  No orders of the defined types were placed in this encounter.

## 2023-09-15 ENCOUNTER — Encounter: Payer: Self-pay | Admitting: Student

## 2023-09-15 ENCOUNTER — Ambulatory Visit (INDEPENDENT_AMBULATORY_CARE_PROVIDER_SITE_OTHER): Admitting: Student

## 2023-09-15 VITALS — BP 110/60 | HR 66 | Temp 98.3°F | Resp 18 | Ht 66.0 in | Wt 164.6 lb

## 2023-09-15 DIAGNOSIS — Z8781 Personal history of (healed) traumatic fracture: Secondary | ICD-10-CM

## 2023-09-15 DIAGNOSIS — Z780 Asymptomatic menopausal state: Secondary | ICD-10-CM | POA: Diagnosis not present

## 2023-09-15 DIAGNOSIS — Z87891 Personal history of nicotine dependence: Secondary | ICD-10-CM

## 2023-09-15 DIAGNOSIS — J309 Allergic rhinitis, unspecified: Secondary | ICD-10-CM

## 2023-09-15 DIAGNOSIS — Z0000 Encounter for general adult medical examination without abnormal findings: Secondary | ICD-10-CM | POA: Diagnosis not present

## 2023-09-15 NOTE — Assessment & Plan Note (Signed)
 DEXA scan ordered to assess bone density and evaluate fracture risk. Referral sent. Discussed the purpose of the scan as a tool to better understand overall bone health. Patient verbalized understanding and intends to proceed with the scan

## 2023-09-15 NOTE — Assessment & Plan Note (Signed)
Is receiving allergy shots and doing well even lives with a cat now

## 2023-09-15 NOTE — Assessment & Plan Note (Addendum)
 Referral placed for LDCT Lung Ca Screening

## 2023-09-15 NOTE — Assessment & Plan Note (Addendum)
 DEXA scan ordered. Encouraged patient ensure adequate intake of calcium and Vitamin D to maintain bone health and decrease fracture risk. Vitamin D check today.

## 2023-09-16 ENCOUNTER — Other Ambulatory Visit

## 2023-09-21 ENCOUNTER — Other Ambulatory Visit (INDEPENDENT_AMBULATORY_CARE_PROVIDER_SITE_OTHER)

## 2023-09-21 ENCOUNTER — Ambulatory Visit: Payer: Self-pay | Admitting: Student

## 2023-09-21 DIAGNOSIS — Z8781 Personal history of (healed) traumatic fracture: Secondary | ICD-10-CM | POA: Diagnosis not present

## 2023-09-21 DIAGNOSIS — Z0000 Encounter for general adult medical examination without abnormal findings: Secondary | ICD-10-CM | POA: Diagnosis not present

## 2023-09-21 DIAGNOSIS — J309 Allergic rhinitis, unspecified: Secondary | ICD-10-CM | POA: Diagnosis not present

## 2023-09-21 DIAGNOSIS — Z780 Asymptomatic menopausal state: Secondary | ICD-10-CM

## 2023-09-21 LAB — CBC WITH DIFFERENTIAL/PLATELET
Basophils Absolute: 0.1 10*3/uL (ref 0.0–0.1)
Basophils Relative: 1.3 % (ref 0.0–3.0)
Eosinophils Absolute: 0.4 10*3/uL (ref 0.0–0.7)
Eosinophils Relative: 6.7 % — ABNORMAL HIGH (ref 0.0–5.0)
HCT: 38.2 % (ref 36.0–46.0)
Hemoglobin: 12.7 g/dL (ref 12.0–15.0)
Lymphocytes Relative: 43.3 % (ref 12.0–46.0)
Lymphs Abs: 2.6 10*3/uL (ref 0.7–4.0)
MCHC: 33.1 g/dL (ref 30.0–36.0)
MCV: 90.3 fl (ref 78.0–100.0)
Monocytes Absolute: 0.5 10*3/uL (ref 0.1–1.0)
Monocytes Relative: 7.9 % (ref 3.0–12.0)
Neutro Abs: 2.5 10*3/uL (ref 1.4–7.7)
Neutrophils Relative %: 40.8 % — ABNORMAL LOW (ref 43.0–77.0)
Platelets: 310 10*3/uL (ref 150.0–400.0)
RBC: 4.23 Mil/uL (ref 3.87–5.11)
RDW: 13.6 % (ref 11.5–15.5)
WBC: 6 10*3/uL (ref 4.0–10.5)

## 2023-09-21 LAB — COMPREHENSIVE METABOLIC PANEL WITH GFR
ALT: 19 U/L (ref 0–35)
AST: 18 U/L (ref 0–37)
Albumin: 4.2 g/dL (ref 3.5–5.2)
Alkaline Phosphatase: 44 U/L (ref 39–117)
BUN: 11 mg/dL (ref 6–23)
CO2: 28 meq/L (ref 19–32)
Calcium: 9.1 mg/dL (ref 8.4–10.5)
Chloride: 104 meq/L (ref 96–112)
Creatinine, Ser: 0.54 mg/dL (ref 0.40–1.20)
GFR: 101.17 mL/min (ref >60.00)
Glucose, Bld: 82 mg/dL (ref 70–99)
Potassium: 4.7 meq/L (ref 3.5–5.1)
Sodium: 141 meq/L (ref 135–145)
Total Bilirubin: 0.3 mg/dL (ref 0.2–1.2)
Total Protein: 6.5 g/dL (ref 6.0–8.3)

## 2023-09-21 LAB — LIPID PANEL
Cholesterol: 170 mg/dL (ref 0–200)
HDL: 61.4 mg/dL (ref >39.00)
LDL Cholesterol: 88 mg/dL (ref 0–99)
NonHDL: 108.74
Total CHOL/HDL Ratio: 3
Triglycerides: 106 mg/dL (ref 0.0–149.0)
VLDL: 21.2 mg/dL (ref 0.0–40.0)

## 2023-09-21 LAB — TSH: TSH: 4.72 u[IU]/mL (ref 0.35–5.50)

## 2023-09-21 LAB — VITAMIN D 25 HYDROXY (VIT D DEFICIENCY, FRACTURES): VITD: 41.82 ng/mL (ref 30.00–100.00)

## 2023-10-08 DIAGNOSIS — J302 Other seasonal allergic rhinitis: Secondary | ICD-10-CM | POA: Diagnosis not present

## 2023-10-08 NOTE — Progress Notes (Signed)
 VIALS MADE ON 10/08/23

## 2023-10-11 DIAGNOSIS — J3081 Allergic rhinitis due to animal (cat) (dog) hair and dander: Secondary | ICD-10-CM | POA: Diagnosis not present

## 2023-10-12 ENCOUNTER — Ambulatory Visit (INDEPENDENT_AMBULATORY_CARE_PROVIDER_SITE_OTHER)

## 2023-10-12 DIAGNOSIS — J309 Allergic rhinitis, unspecified: Secondary | ICD-10-CM | POA: Diagnosis not present

## 2023-10-20 ENCOUNTER — Telehealth (HOSPITAL_BASED_OUTPATIENT_CLINIC_OR_DEPARTMENT_OTHER): Payer: Self-pay

## 2023-11-11 ENCOUNTER — Ambulatory Visit (INDEPENDENT_AMBULATORY_CARE_PROVIDER_SITE_OTHER): Payer: Self-pay

## 2023-11-11 DIAGNOSIS — J309 Allergic rhinitis, unspecified: Secondary | ICD-10-CM | POA: Diagnosis not present

## 2023-12-13 ENCOUNTER — Ambulatory Visit (INDEPENDENT_AMBULATORY_CARE_PROVIDER_SITE_OTHER): Payer: Self-pay | Admitting: *Deleted

## 2023-12-13 DIAGNOSIS — J309 Allergic rhinitis, unspecified: Secondary | ICD-10-CM

## 2024-01-11 ENCOUNTER — Other Ambulatory Visit: Payer: Self-pay

## 2024-01-11 ENCOUNTER — Ambulatory Visit (INDEPENDENT_AMBULATORY_CARE_PROVIDER_SITE_OTHER): Payer: Self-pay

## 2024-01-11 DIAGNOSIS — J309 Allergic rhinitis, unspecified: Secondary | ICD-10-CM

## 2024-01-11 MED ORDER — EPINEPHRINE 0.3 MG/0.3ML IJ SOAJ: INTRAMUSCULAR | 2 refills | Status: AC

## 2024-01-11 NOTE — Telephone Encounter (Signed)
 Refill for EpiPen  0.3 mg sent to [facility] x 2 with 2 refills.

## 2024-01-18 ENCOUNTER — Ambulatory Visit (INDEPENDENT_AMBULATORY_CARE_PROVIDER_SITE_OTHER)

## 2024-01-18 DIAGNOSIS — J309 Allergic rhinitis, unspecified: Secondary | ICD-10-CM

## 2024-01-25 ENCOUNTER — Ambulatory Visit (INDEPENDENT_AMBULATORY_CARE_PROVIDER_SITE_OTHER)

## 2024-01-25 DIAGNOSIS — J309 Allergic rhinitis, unspecified: Secondary | ICD-10-CM | POA: Diagnosis not present

## 2024-03-01 ENCOUNTER — Ambulatory Visit (INDEPENDENT_AMBULATORY_CARE_PROVIDER_SITE_OTHER)

## 2024-03-01 DIAGNOSIS — J309 Allergic rhinitis, unspecified: Secondary | ICD-10-CM | POA: Diagnosis not present

## 2024-04-04 ENCOUNTER — Ambulatory Visit

## 2024-04-04 DIAGNOSIS — J309 Allergic rhinitis, unspecified: Secondary | ICD-10-CM | POA: Diagnosis not present

## 2024-05-04 ENCOUNTER — Ambulatory Visit (INDEPENDENT_AMBULATORY_CARE_PROVIDER_SITE_OTHER)

## 2024-05-04 DIAGNOSIS — J302 Other seasonal allergic rhinitis: Secondary | ICD-10-CM

## 2024-05-09 ENCOUNTER — Ambulatory Visit
Admission: RE | Admit: 2024-05-09 | Discharge: 2024-05-09 | Disposition: A | Source: Ambulatory Visit | Attending: Student

## 2024-05-09 DIAGNOSIS — Z0000 Encounter for general adult medical examination without abnormal findings: Secondary | ICD-10-CM

## 2024-09-13 ENCOUNTER — Ambulatory Visit (HOSPITAL_BASED_OUTPATIENT_CLINIC_OR_DEPARTMENT_OTHER): Payer: Self-pay | Admitting: Obstetrics & Gynecology

## 2024-09-28 ENCOUNTER — Encounter: Admitting: Family Medicine
